# Patient Record
Sex: Female | Born: 1940 | Race: White | Hispanic: No | Smoking: Never smoker
Health system: Southern US, Community
[De-identification: ages and names within clinical notes are randomized; demographics above are authoritative.]

## PROBLEM LIST (undated history)

## (undated) DIAGNOSIS — H409 Unspecified glaucoma: Secondary | ICD-10-CM

## (undated) DIAGNOSIS — J189 Pneumonia, unspecified organism: Secondary | ICD-10-CM

## (undated) DIAGNOSIS — M1990 Unspecified osteoarthritis, unspecified site: Secondary | ICD-10-CM

## (undated) DIAGNOSIS — E7800 Pure hypercholesterolemia, unspecified: Secondary | ICD-10-CM

## (undated) DIAGNOSIS — I10 Essential (primary) hypertension: Secondary | ICD-10-CM

## (undated) DIAGNOSIS — E039 Hypothyroidism, unspecified: Secondary | ICD-10-CM

## (undated) DIAGNOSIS — E119 Type 2 diabetes mellitus without complications: Secondary | ICD-10-CM

## (undated) DIAGNOSIS — K219 Gastro-esophageal reflux disease without esophagitis: Secondary | ICD-10-CM

## (undated) HISTORY — PX: EYE SURGERY: SHX253

## (undated) HISTORY — DX: Hypothyroidism, unspecified: E03.9

## (undated) HISTORY — DX: Unspecified glaucoma: H40.9

## (undated) HISTORY — DX: Essential (primary) hypertension: I10

## (undated) HISTORY — DX: Type 2 diabetes mellitus without complications: E11.9

## (undated) HISTORY — DX: Pure hypercholesterolemia, unspecified: E78.00

## (undated) HISTORY — DX: Gastro-esophageal reflux disease without esophagitis: K21.9

---

## 2005-07-26 ENCOUNTER — Ambulatory Visit: Payer: Self-pay | Admitting: Internal Medicine

## 2005-09-27 ENCOUNTER — Ambulatory Visit: Payer: Self-pay | Admitting: Internal Medicine

## 2006-08-03 ENCOUNTER — Ambulatory Visit: Payer: Self-pay | Admitting: Internal Medicine

## 2006-10-03 ENCOUNTER — Ambulatory Visit: Payer: Self-pay | Admitting: Nephrology

## 2007-04-03 ENCOUNTER — Ambulatory Visit: Payer: Self-pay | Admitting: Nephrology

## 2007-09-06 ENCOUNTER — Ambulatory Visit: Payer: Self-pay | Admitting: Internal Medicine

## 2007-10-24 ENCOUNTER — Ambulatory Visit: Payer: Self-pay | Admitting: Nephrology

## 2007-11-09 ENCOUNTER — Ambulatory Visit: Payer: Self-pay | Admitting: Nephrology

## 2008-09-24 ENCOUNTER — Ambulatory Visit: Payer: Self-pay | Admitting: Internal Medicine

## 2010-03-16 ENCOUNTER — Ambulatory Visit: Payer: Self-pay | Admitting: Internal Medicine

## 2010-04-29 ENCOUNTER — Ambulatory Visit: Payer: Self-pay | Admitting: Gastroenterology

## 2011-02-03 ENCOUNTER — Ambulatory Visit: Payer: Self-pay | Admitting: Internal Medicine

## 2011-04-20 ENCOUNTER — Ambulatory Visit: Payer: Self-pay | Admitting: Internal Medicine

## 2011-04-20 LAB — HM MAMMOGRAPHY

## 2011-10-17 ENCOUNTER — Ambulatory Visit: Payer: Self-pay | Admitting: Internal Medicine

## 2011-12-19 ENCOUNTER — Ambulatory Visit: Payer: Self-pay | Admitting: General Practice

## 2011-12-19 LAB — MRSA PCR SCREENING

## 2011-12-19 LAB — URINALYSIS, COMPLETE
Bacteria: NONE SEEN
Bilirubin,UR: NEGATIVE
Blood: NEGATIVE
Ketone: NEGATIVE
Ph: 6 (ref 4.5–8.0)
Protein: NEGATIVE
Squamous Epithelial: 1

## 2011-12-19 LAB — CBC
HCT: 41.6 % (ref 35.0–47.0)
HGB: 14.2 g/dL (ref 12.0–16.0)
MCH: 30.7 pg (ref 26.0–34.0)
MCV: 90 fL (ref 80–100)
Platelet: 258 10*3/uL (ref 150–440)
RBC: 4.63 10*6/uL (ref 3.80–5.20)
RDW: 12 % (ref 11.5–14.5)
WBC: 6.3 10*3/uL (ref 3.6–11.0)

## 2011-12-19 LAB — BASIC METABOLIC PANEL
Anion Gap: 5 — ABNORMAL LOW (ref 7–16)
BUN: 25 mg/dL — ABNORMAL HIGH (ref 7–18)
Calcium, Total: 9.4 mg/dL (ref 8.5–10.1)
Creatinine: 1 mg/dL (ref 0.60–1.30)
EGFR (Non-African Amer.): 57 — ABNORMAL LOW
Glucose: 130 mg/dL — ABNORMAL HIGH (ref 65–99)
Osmolality: 289 (ref 275–301)

## 2011-12-19 LAB — PROTIME-INR
INR: 0.9
Prothrombin Time: 12.8 secs (ref 11.5–14.7)

## 2011-12-19 LAB — APTT: Activated PTT: 27.8 secs (ref 23.6–35.9)

## 2011-12-19 LAB — SEDIMENTATION RATE: Erythrocyte Sed Rate: 16 mm/hr (ref 0–30)

## 2011-12-20 LAB — URINE CULTURE

## 2011-12-28 HISTORY — PX: TOTAL HIP ARTHROPLASTY: SHX124

## 2012-01-02 ENCOUNTER — Inpatient Hospital Stay: Payer: Self-pay | Admitting: General Practice

## 2012-01-03 LAB — BASIC METABOLIC PANEL
Anion Gap: 6 — ABNORMAL LOW (ref 7–16)
Calcium, Total: 7.7 mg/dL — ABNORMAL LOW (ref 8.5–10.1)
Chloride: 101 mmol/L (ref 98–107)
Co2: 30 mmol/L (ref 21–32)
Creatinine: 1.08 mg/dL (ref 0.60–1.30)
EGFR (African American): >60
EGFR (Non-African Amer.): 52 — ABNORMAL LOW
Osmolality: 279 (ref 275–301)
Sodium: 137 mmol/L (ref 136–145)

## 2012-01-03 LAB — PLATELET COUNT: Platelet: 182 10*3/uL (ref 150–440)

## 2012-01-03 LAB — PATHOLOGY REPORT

## 2012-01-03 LAB — HEMOGLOBIN: HGB: 10.7 g/dL — ABNORMAL LOW (ref 12.0–16.0)

## 2012-01-04 LAB — BASIC METABOLIC PANEL
Anion Gap: 5 — ABNORMAL LOW (ref 7–16)
BUN: 16 mg/dL (ref 7–18)
Calcium, Total: 8.1 mg/dL — ABNORMAL LOW (ref 8.5–10.1)
Chloride: 100 mmol/L (ref 98–107)
EGFR (African American): 56 — ABNORMAL LOW
EGFR (Non-African Amer.): 49 — ABNORMAL LOW
Glucose: 118 mg/dL — ABNORMAL HIGH (ref 65–99)
Osmolality: 267 (ref 275–301)
Potassium: 3.6 mmol/L (ref 3.5–5.1)
Sodium: 132 mmol/L — ABNORMAL LOW (ref 136–145)

## 2012-01-04 LAB — PLATELET COUNT: Platelet: 164 10*3/uL (ref 150–440)

## 2012-04-25 ENCOUNTER — Ambulatory Visit: Payer: Self-pay | Admitting: Internal Medicine

## 2012-06-14 ENCOUNTER — Other Ambulatory Visit: Payer: Self-pay | Admitting: *Deleted

## 2012-06-14 NOTE — Telephone Encounter (Signed)
Will send to pharmacy, called pt to see what dosage of Pravastatin she uses since it was not included on fax sent by [facility]. Left message for pt to callback office with dosage of Pravastatin

## 2012-06-14 NOTE — Telephone Encounter (Signed)
Ok to fill #[id number] with no refills.

## 2012-06-14 NOTE — Telephone Encounter (Signed)
R'cd fax from [facility] for 90 day supply refill of Pravastatin-pt has appointment on 08/10/2012 with you-please advise on refill.

## 2012-06-15 MED ORDER — PRAVASTATIN SODIUM 40 MG PO TABS: 40.0000 mg | ORAL_TABLET | Freq: Every day | ORAL | Status: DC

## 2012-06-15 NOTE — Telephone Encounter (Signed)
Pt is currently taking Pravastatin 40mg  qd, rx sent to [facility], pt informed.

## 2012-08-10 ENCOUNTER — Encounter: Payer: Self-pay | Admitting: Internal Medicine

## 2012-08-10 ENCOUNTER — Ambulatory Visit (INDEPENDENT_AMBULATORY_CARE_PROVIDER_SITE_OTHER): Payer: 59 | Admitting: Internal Medicine

## 2012-08-10 VITALS — BP 140/72 | HR 52 | Temp 97.4°F | Ht 60.0 in | Wt 229.5 lb

## 2012-08-10 DIAGNOSIS — H409 Unspecified glaucoma: Secondary | ICD-10-CM

## 2012-08-10 DIAGNOSIS — E039 Hypothyroidism, unspecified: Secondary | ICD-10-CM

## 2012-08-10 DIAGNOSIS — E1165 Type 2 diabetes mellitus with hyperglycemia: Secondary | ICD-10-CM | POA: Insufficient documentation

## 2012-08-10 DIAGNOSIS — I10 Essential (primary) hypertension: Secondary | ICD-10-CM

## 2012-08-10 DIAGNOSIS — E7800 Pure hypercholesterolemia, unspecified: Secondary | ICD-10-CM

## 2012-08-10 DIAGNOSIS — E119 Type 2 diabetes mellitus without complications: Secondary | ICD-10-CM

## 2012-08-10 DIAGNOSIS — K219 Gastro-esophageal reflux disease without esophagitis: Secondary | ICD-10-CM

## 2012-08-10 DIAGNOSIS — E1169 Type 2 diabetes mellitus with other specified complication: Secondary | ICD-10-CM | POA: Insufficient documentation

## 2012-08-10 DIAGNOSIS — E785 Hyperlipidemia, unspecified: Secondary | ICD-10-CM | POA: Insufficient documentation

## 2012-08-10 NOTE — Patient Instructions (Addendum)
It was nice seeing you today.  I am glad you have been doing well.  Let me know if you need anything. 

## 2012-08-12 ENCOUNTER — Encounter: Payer: Self-pay | Admitting: Internal Medicine

## 2012-08-12 NOTE — Assessment & Plan Note (Signed)
On Januvia.  Continue diet and exercise.  Keep up to date with eye checks. Check met b and a1c.

## 2012-08-12 NOTE — Assessment & Plan Note (Signed)
Upper symptoms controlled on Protonix.  Follow.

## 2012-08-12 NOTE — Assessment & Plan Note (Signed)
Followed by opthalmology.       

## 2012-08-12 NOTE — Progress Notes (Signed)
Subjective:    Patient ID: Lorraine Foley, female    DOB: 1941-03-10, 71 y.o.   MRN: [id number]  HPI 71 year old female with past history of hypertension, hypercholesterolemia and diabetes who comes in today for a scheduled follow up.  She states she has been doing relatively well.  Is having some left knee discomfort.  She thinks this flared from trying to compensate for the right hip problems she had been having.  She is doing well from her right hip surgery.  Has follow up with Dr Ernest Pine in 5/14.  She states she has not been watching her diet as well lately, but plans to get more serious.  Blood pressure checks - 130s/70s.  She has not been checking her sugars.  She did get her flu shot.  Feels her breathing is stable.    Past Medical History  Diagnosis Date  . Hypertension   . Diabetes mellitus   . Hypercholesterolemia   . Glaucoma   . GERD (gastroesophageal reflux disease)   . Hypothyroidism     Current Outpatient Prescriptions on File Prior to Visit  Medication Sig Dispense Refill  . amLODipine (NORVASC) 10 MG tablet Take 10 mg by mouth daily.      . calcium-vitamin D (CALCIUM 500+D) 500-200 MG-UNIT per tablet Take 1 tablet by mouth daily.      . cloNIDine (CATAPRES) 0.1 MG tablet Take 0.1 mg by mouth 3 (three) times daily.      Marland Kitchen levothyroxine (SYNTHROID) 112 MCG tablet Take 112 mcg by mouth daily.      Marland Kitchen omega-3 acid ethyl esters (LOVAZA) 1 G capsule Take 2 g by mouth daily.      . pantoprazole (PROTONIX) 40 MG tablet Take 40 mg by mouth daily.      . pravastatin (PRAVACHOL) 40 MG tablet Take 1 tablet (40 mg total) by mouth daily.  90 tablet  0  . quinapril (ACCUPRIL) 40 MG tablet Take 40 mg by mouth 2 (two) times daily.      . sitaGLIPtin (JANUVIA) 100 MG tablet Take 100 mg by mouth daily.      Marland Kitchen spironolactone-hydrochlorothiazide (ALDACTAZIDE) 25-25 MG per tablet Take 1 tablet by mouth daily.        Review of Systems Patient denies any headache, lightheadedness or dizziness.   No sinus or allergy symptoms.  No chest pain, tightness or palpitations.  No increased shortness of breath, cough or congestion.  Breathing stable.  No nausea or vomiting.  No abdominal pain or cramping.  No bowel change, such as diarrhea, constipation, BRBPR or melana.  No urine change.        Objective:   Physical Exam Filed Vitals:   08/10/12 0941  BP: 140/72  Pulse: 52  Temp: 97.4 F (60.59 C)   71 year old female in no acute distress.   HEENT:  Nares - clear.  OP- without lesions or erythema.  NECK:  Supple, nontender.  Left carotid bruit.    HEART:  Appears to be regular.  I/VI systolic murmur.  LUNGS:  Without crackles or wheezing audible.  Respirations even and unlabored.   RADIAL PULSE:  Equal bilaterally.  ABDOMEN:  Soft, nontender.  No audible abdominal bruit.   EXTREMITIES:  No increased edema to be present.                  Assessment & Plan:  MSK. Doing well s/p right hip surgery.  Seeing Dr Ernest Pine.  Is having some left knee  pain.  Seeing chiropractor.  Desires no further intervention at this time.    CARDIOVASCULAR.  Stable.  Asymptomatic.  Continue risk factor modification.    PULMONARY.  Breathing stable.  Follow.   GI.  Colonoscopy 9/11 with diverticulosis.  Upper symptoms controlled on Protonix.    LEFT CAROTID BRUIT.  Had carotid ultrasound 6/12 that revealed no hemodynamically significant stenosis.  Continue risk factor modification and daily aspirin.    HEALTH MAINTENANCE.  Physical 03/21/12.  Colonoscopy as outlined. Obtain results of mammogram for 2013.  Bone density 05/07/08 normal.

## 2012-08-12 NOTE — Assessment & Plan Note (Signed)
On Synthroid.  Follow TSH.   

## 2012-08-12 NOTE — Assessment & Plan Note (Signed)
Low cholesterol diet and exercise.  Taking zocor.  Check lipid panel and liver function.

## 2012-08-12 NOTE — Assessment & Plan Note (Signed)
Blood pressure has been doing better. Same meds.  Check metabolic panel.

## 2012-08-17 ENCOUNTER — Encounter: Payer: Self-pay | Admitting: Internal Medicine

## 2012-08-19 ENCOUNTER — Telehealth: Payer: Self-pay | Admitting: Internal Medicine

## 2012-08-19 NOTE — Telephone Encounter (Signed)
I need her lab results from [facility].  See message.  Had drawn last week.

## 2012-08-20 NOTE — Telephone Encounter (Signed)
Labs arrived and are in the received folder on counter

## 2012-08-20 NOTE — Telephone Encounter (Signed)
[facility], they are going to fax over patient's labs.

## 2012-08-21 NOTE — Telephone Encounter (Signed)
Called patient to let her know. Left message for patient to return call. Will call back again later.

## 2012-08-21 NOTE — Telephone Encounter (Signed)
Notify pt I reviewed her labs.  The overall sugar control (a1c = 6.5) - is good.  Continue diabetic diet and same meds.  Renal function is stable.  Liver panel wnl.  Other labs ok except total and LDL (bad) cholesterol - elevated.  Low cholesterol diet.  Is she taking her pravastatin regularly and not missing doses.  If not, then needs to take daily.  If taking daily and not missing doses then I would like to increase her pravastatin to 80mg  q day.  Will need new rx and will need to change on her med list.  If any problems, let me know.

## 2012-08-24 ENCOUNTER — Other Ambulatory Visit: Payer: Self-pay | Admitting: General Practice

## 2012-08-24 MED ORDER — PRAVASTATIN SODIUM 40 MG PO TABS: 80.0000 mg | ORAL_TABLET | Freq: Every day | ORAL | Status: DC

## 2012-08-24 NOTE — Progress Notes (Signed)
Per the note on her most recent labs (phone message) - her pravastatin was increased to 80mg  q day.  Pt had wanted to use her 40mg  tablets before getting a new rx.  If she is needing a new prescription then can now call in 80mg  q day.  Per the rx that was sent in 90 tablets of the 40mg  sent in.  (if she is taking 2/day- then this will only last her 1 1/2 month).  I would recommend sending in rx for the 80mg  and have her take one per day.  I think she uses mail order, but can clarify with the patient.  Let me know if any problems.

## 2012-08-24 NOTE — Progress Notes (Signed)
Patient has new prescription of 40 mg pravastatin. She wants to finish this prescription before she starts 80 mg.

## 2012-08-24 NOTE — Telephone Encounter (Signed)
Pt notified of her recent lab results. Pt asked if she could just double up her 40mg  Pravastatin instead of calling a new Rx. Informed pt that should be ok.

## 2012-09-08 ENCOUNTER — Encounter: Payer: Self-pay | Admitting: Internal Medicine

## 2012-09-10 ENCOUNTER — Telehealth: Payer: Self-pay | Admitting: Internal Medicine

## 2012-09-10 NOTE — Telephone Encounter (Signed)
She had labs at [facility].  They were in your "to do" folder.

## 2012-09-14 ENCOUNTER — Encounter: Payer: Self-pay | Admitting: Internal Medicine

## 2012-11-08 ENCOUNTER — Other Ambulatory Visit: Payer: Self-pay | Admitting: Internal Medicine

## 2012-11-08 NOTE — Telephone Encounter (Signed)
Patient left a message on voicemail stating she needs a refill on her Pravastatin

## 2012-11-09 ENCOUNTER — Telehealth: Payer: Self-pay | Admitting: *Deleted

## 2012-11-09 MED ORDER — PRAVASTATIN SODIUM 40 MG PO TABS: 80.0000 mg | ORAL_TABLET | Freq: Every day | ORAL | Status: DC

## 2012-11-09 NOTE — Telephone Encounter (Signed)
Patient called back and said that she wants to mail her rx to her home address.

## 2012-11-09 NOTE — Telephone Encounter (Signed)
See other message, script is waiting to be signed.

## 2012-11-09 NOTE — Telephone Encounter (Signed)
Lorraine Foley called the office requesting a refill on her pravastatin 40 mg. She would like to pick up a paper script this afternoon

## 2012-11-12 ENCOUNTER — Telehealth: Payer: Self-pay | Admitting: *Deleted

## 2012-11-12 NOTE — Telephone Encounter (Signed)
Called patient to let her know that her prescription is ready for pick-up. Left message on voice mail.

## 2012-11-21 ENCOUNTER — Other Ambulatory Visit: Payer: Self-pay | Admitting: *Deleted

## 2012-11-23 MED ORDER — PRAVASTATIN SODIUM 40 MG PO TABS: 80.0000 mg | ORAL_TABLET | Freq: Every day | ORAL | Status: DC

## 2012-11-23 MED ORDER — LEVOTHYROXINE SODIUM 112 MCG PO TABS: 112.0000 ug | ORAL_TABLET | Freq: Every day | ORAL | Status: DC

## 2012-11-23 MED ORDER — SPIRONOLACTONE-HCTZ 25-25 MG PO TABS: 1.0000 | ORAL_TABLET | Freq: Every day | ORAL | Status: DC

## 2012-11-23 MED ORDER — PANTOPRAZOLE SODIUM 40 MG PO TBEC: 40.0000 mg | DELAYED_RELEASE_TABLET | Freq: Every day | ORAL | Status: DC

## 2012-11-23 MED ORDER — AMLODIPINE BESYLATE 10 MG PO TABS: 10.0000 mg | ORAL_TABLET | Freq: Every day | ORAL | Status: DC

## 2012-11-23 MED ORDER — CLONIDINE HCL 0.1 MG PO TABS: 0.1000 mg | ORAL_TABLET | Freq: Three times a day (TID) | ORAL | Status: DC

## 2012-11-23 MED ORDER — QUINAPRIL HCL 40 MG PO TABS: 40.0000 mg | ORAL_TABLET | Freq: Two times a day (BID) | ORAL | Status: DC

## 2012-11-23 NOTE — Telephone Encounter (Signed)
meds filled

## 2012-11-24 ENCOUNTER — Telehealth: Payer: Self-pay | Admitting: Internal Medicine

## 2012-11-24 NOTE — Telephone Encounter (Signed)
Opened in error

## 2012-11-28 ENCOUNTER — Encounter: Payer: Self-pay | Admitting: *Deleted

## 2012-12-11 ENCOUNTER — Other Ambulatory Visit: Payer: Self-pay | Admitting: Internal Medicine

## 2012-12-11 ENCOUNTER — Ambulatory Visit (INDEPENDENT_AMBULATORY_CARE_PROVIDER_SITE_OTHER): Payer: 59 | Admitting: Internal Medicine

## 2012-12-11 ENCOUNTER — Encounter: Payer: Self-pay | Admitting: Internal Medicine

## 2012-12-11 VITALS — BP 130/70 | HR 64 | Temp 98.5°F | Wt 224.5 lb

## 2012-12-11 DIAGNOSIS — I10 Essential (primary) hypertension: Secondary | ICD-10-CM

## 2012-12-11 DIAGNOSIS — K219 Gastro-esophageal reflux disease without esophagitis: Secondary | ICD-10-CM

## 2012-12-11 DIAGNOSIS — E7800 Pure hypercholesterolemia, unspecified: Secondary | ICD-10-CM

## 2012-12-11 DIAGNOSIS — E119 Type 2 diabetes mellitus without complications: Secondary | ICD-10-CM

## 2012-12-11 DIAGNOSIS — E039 Hypothyroidism, unspecified: Secondary | ICD-10-CM

## 2012-12-11 DIAGNOSIS — H409 Unspecified glaucoma: Secondary | ICD-10-CM

## 2012-12-12 ENCOUNTER — Other Ambulatory Visit: Payer: Self-pay | Admitting: Internal Medicine

## 2012-12-12 LAB — LIPID PANEL W/O CHOL/HDL RATIO
LDL Calculated: 106 mg/dL — ABNORMAL HIGH (ref 0–99)
Triglycerides: 162 mg/dL — ABNORMAL HIGH (ref 0–149)

## 2012-12-12 LAB — HEPATIC FUNCTION PANEL
Albumin: 4.5 g/dL (ref 3.5–4.8)
Bilirubin, Direct: 0.13 mg/dL (ref 0.00–0.40)
Total Protein: 6.7 g/dL (ref 6.0–8.5)

## 2012-12-12 LAB — BASIC METABOLIC PANEL
BUN/Creatinine Ratio: 20 (ref 11–26)
GFR calc Af Amer: 52 mL/min/{1.73_m2} — ABNORMAL LOW (ref >59)
GFR calc non Af Amer: 45 mL/min/{1.73_m2} — ABNORMAL LOW (ref >59)
Glucose: 150 mg/dL — ABNORMAL HIGH (ref 65–99)
Potassium: 4.8 mmol/L (ref 3.5–5.2)

## 2012-12-12 LAB — HGB A1C W/O EAG: Hgb A1c MFr Bld: 7 % — ABNORMAL HIGH (ref 4.8–5.6)

## 2012-12-12 MED ORDER — PRAVASTATIN SODIUM 40 MG PO TABS: 80.0000 mg | ORAL_TABLET | Freq: Every day | ORAL | Status: DC

## 2012-12-12 NOTE — Telephone Encounter (Signed)
Received mail from pt requesting refill on her Pravastatin though [facility]  Order has been sent in to mail order pharmacy

## 2012-12-16 ENCOUNTER — Encounter: Payer: Self-pay | Admitting: Internal Medicine

## 2012-12-16 NOTE — Assessment & Plan Note (Signed)
Blood pressure has been doing better. Same meds.  Check metabolic panel.

## 2012-12-16 NOTE — Assessment & Plan Note (Signed)
Followed by opthalmology.       

## 2012-12-16 NOTE — Assessment & Plan Note (Signed)
On Synthroid.  Follow TSH.   

## 2012-12-16 NOTE — Progress Notes (Signed)
Subjective:    Patient ID: Francetta Ilg, female    DOB: [birth year], 72 y.o.   MRN: [id number]  HPI 72 year old female with past history of hypertension, hypercholesterolemia and diabetes who comes in today for a scheduled follow up.  She states she has been doing relatively well.  She is doing well from her right hip surgery.  Has follow up with Dr Ernest Pine in 5/14.   Trying to watch her diet.  Has been concentrating more on her cholesterol.   Has a Control and instrumentation engineer.   Blood pressure checks have been doing well.  Feels her breathing is stable.  Overall she feels good.    Past Medical History  Diagnosis Date  . Hypertension   . Diabetes mellitus   . Hypercholesterolemia   . Glaucoma   . GERD (gastroesophageal reflux disease)   . Hypothyroidism     Current Outpatient Prescriptions on File Prior to Visit  Medication Sig Dispense Refill  . amLODipine (NORVASC) 10 MG tablet Take 1 tablet (10 mg total) by mouth daily.  90 tablet  1  . aspirin 81 MG tablet Take 81 mg by mouth daily.      . calcium-vitamin D (CALCIUM 500+D) 500-200 MG-UNIT per tablet Take 1 tablet by mouth daily.      . Cholecalciferol (VITAMIN D-3) 1000 UNITS CAPS Take 1 capsule by mouth daily.      . cloNIDine (CATAPRES) 0.1 MG tablet Take 1 tablet (0.1 mg total) by mouth 3 (three) times daily.  90 tablet  1  . glucosamine-chondroitin 500-400 MG tablet Take 1 tablet by mouth daily.      Marland Kitchen glucose blood test strip accuchek compact test strips Check blood sugar tid      . latanoprost (XALATAN) 0.005 % ophthalmic solution As directed      . LevOCARNitine (L-CARNITINE) 250 MG CAPS Take 1 capsule by mouth daily.      Marland Kitchen levothyroxine (SYNTHROID) 112 MCG tablet Take 1 tablet (112 mcg total) by mouth daily.  90 tablet  1  . Multiple Vitamin (MULTIVITAMIN) tablet Take 1 tablet by mouth daily.      . Multiple Vitamins-Minerals (MULTIVITAMIN & MINERAL PO) Take by mouth daily.      Marland Kitchen omega-3 acid ethyl esters (LOVAZA) 1 G capsule Take 2 g by  mouth daily.      . pantoprazole (PROTONIX) 40 MG tablet Take 1 tablet (40 mg total) by mouth daily.  90 tablet  1  . quinapril (ACCUPRIL) 40 MG tablet Take 1 tablet (40 mg total) by mouth 2 (two) times daily.  60 tablet  3  . sitaGLIPtin (JANUVIA) 100 MG tablet Take 100 mg by mouth daily.      Marland Kitchen spironolactone-hydrochlorothiazide (ALDACTAZIDE) 25-25 MG per tablet Take 1 tablet by mouth daily.  90 tablet  1   No current facility-administered medications on file prior to visit.    Review of Systems Patient denies any headache, lightheadedness or dizziness.  No sinus or allergy symptoms.  No chest pain, tightness or palpitations.  No increased shortness of breath, cough or congestion.  Breathing stable.  No nausea or vomiting.  No abdominal pain or cramping.  No bowel change, such as diarrhea, constipation, BRBPR or melana.  No urine change.        Objective:   Physical Exam  Filed Vitals:   12/11/12 0759  BP: 130/70  Pulse: 64  Temp: 98.5 F (42.52 C)   72 year old female in no acute distress.  HEENT:  Nares - clear.  OP- without lesions or erythema.  NECK:  Supple, nontender.  Left carotid bruit.    HEART:  Appears to be regular.  I/VI systolic murmur.  LUNGS:  Without crackles or wheezing audible.  Respirations even and unlabored.   RADIAL PULSE:  Equal bilaterally.  ABDOMEN:  Soft, nontender.  No audible abdominal bruit.   EXTREMITIES:  No increased edema to be present.                  Assessment & Plan:  MSK. Doing well s/p right hip surgery.  Seeing Dr Ernest Pine.   CARDIOVASCULAR.  Stable.  Asymptomatic.  Continue risk factor modification.    PULMONARY.  Breathing stable.  Follow.   GI.  Colonoscopy 9/11 with diverticulosis.  Upper symptoms controlled on Protonix.    LEFT CAROTID BRUIT.  Had carotid ultrasound 6/12 that revealed no hemodynamically significant stenosis.  Continue risk factor modification and daily aspirin.    HEALTH MAINTENANCE.  Physical 03/21/12.   Colonoscopy as outlined.   Mammogram 04/25/12 - Birads II.  Bone density 05/07/08 normal.

## 2012-12-16 NOTE — Assessment & Plan Note (Signed)
Low cholesterol diet and exercise.  Continue current cholesterol medication.  Check lipid panel and liver function.

## 2012-12-16 NOTE — Assessment & Plan Note (Signed)
Upper symptoms controlled on Protonix.  Follow.

## 2012-12-16 NOTE — Assessment & Plan Note (Signed)
On Januvia.  Continue diet and exercise.  Keep up to date with eye checks. Check met b and a1c.

## 2013-01-01 ENCOUNTER — Other Ambulatory Visit: Payer: Self-pay | Admitting: Internal Medicine

## 2013-01-02 LAB — BASIC METABOLIC PANEL
Calcium: 10 mg/dL (ref 8.6–10.2)
Creatinine, Ser: 1.16 mg/dL — ABNORMAL HIGH (ref 0.57–1.00)
GFR calc Af Amer: 55 mL/min/{1.73_m2} — ABNORMAL LOW (ref >59)
GFR calc non Af Amer: 47 mL/min/{1.73_m2} — ABNORMAL LOW (ref >59)
Sodium: 142 mmol/L (ref 134–144)

## 2013-01-03 ENCOUNTER — Encounter: Payer: Self-pay | Admitting: Internal Medicine

## 2013-01-04 ENCOUNTER — Encounter: Payer: Self-pay | Admitting: Internal Medicine

## 2013-01-07 ENCOUNTER — Telehealth: Payer: Self-pay

## 2013-01-07 NOTE — Telephone Encounter (Signed)
MyChart Message: I reviewed your recent lab results. Your kidney function is stable. Your sodium and potassium are within normal limits. Stay hydrated. Let me know if you need anything.   Left message for patient to either call office back or to check her mychart message for her lab results.

## 2013-04-03 ENCOUNTER — Encounter: Payer: Self-pay | Admitting: *Deleted

## 2013-04-11 ENCOUNTER — Other Ambulatory Visit: Payer: Self-pay | Admitting: Internal Medicine

## 2013-04-11 ENCOUNTER — Encounter: Payer: Self-pay | Admitting: Internal Medicine

## 2013-04-11 ENCOUNTER — Ambulatory Visit (INDEPENDENT_AMBULATORY_CARE_PROVIDER_SITE_OTHER): Payer: 59 | Admitting: Internal Medicine

## 2013-04-11 VITALS — BP 120/60 | HR 42 | Temp 97.7°F | Ht 60.0 in | Wt 221.0 lb

## 2013-04-11 DIAGNOSIS — E7800 Pure hypercholesterolemia, unspecified: Secondary | ICD-10-CM

## 2013-04-11 DIAGNOSIS — I498 Other specified cardiac arrhythmias: Secondary | ICD-10-CM

## 2013-04-11 DIAGNOSIS — K219 Gastro-esophageal reflux disease without esophagitis: Secondary | ICD-10-CM

## 2013-04-11 DIAGNOSIS — H409 Unspecified glaucoma: Secondary | ICD-10-CM

## 2013-04-11 DIAGNOSIS — I10 Essential (primary) hypertension: Secondary | ICD-10-CM

## 2013-04-11 DIAGNOSIS — R001 Bradycardia, unspecified: Secondary | ICD-10-CM

## 2013-04-11 DIAGNOSIS — E039 Hypothyroidism, unspecified: Secondary | ICD-10-CM

## 2013-04-11 DIAGNOSIS — E119 Type 2 diabetes mellitus without complications: Secondary | ICD-10-CM

## 2013-04-11 DIAGNOSIS — Z1211 Encounter for screening for malignant neoplasm of colon: Secondary | ICD-10-CM

## 2013-04-11 LAB — HM DIABETES FOOT EXAM

## 2013-04-11 NOTE — Progress Notes (Signed)
Subjective:    Patient ID: Lorraine Foley, female    DOB: 03-26-1941, 72 y.o.   MRN: [id number]  HPI 72 year old female with past history of hypertension, hypercholesterolemia and diabetes who comes in today to follow up on these issues as well as for a complete physical exam.  She states she has been doing relatively well.  She is doing well from her right hip surgery.  She has noticed some discomfort in her left knee.  Desires no further intervention at this time.  Trying to watch her diet.  Has lost some weight.   Blood pressure checks have been doing well.   States blood pressures have been averaging 130s systolic readings.  Feels her breathing is stable.  Overall she feels good.     Past Medical History  Diagnosis Date  . Hypertension   . Diabetes mellitus   . Hypercholesterolemia   . Glaucoma   . GERD (gastroesophageal reflux disease)   . Hypothyroidism     Current Outpatient Prescriptions on File Prior to Visit  Medication Sig Dispense Refill  . amLODipine (NORVASC) 10 MG tablet Take 1 tablet (10 mg total) by mouth daily.  90 tablet  1  . aspirin 81 MG tablet Take 81 mg by mouth daily.      . brimonidine (ALPHAGAN) 0.2 % ophthalmic solution Place 1 drop into both eyes 2 (two) times daily.      . calcium-vitamin D (CALCIUM 500+D) 500-200 MG-UNIT per tablet Take 1 tablet by mouth daily.      . Cholecalciferol (VITAMIN D-3) 1000 UNITS CAPS Take 1 capsule by mouth daily.      . cloNIDine (CATAPRES) 0.1 MG tablet Take 1 tablet (0.1 mg total) by mouth 3 (three) times daily.  90 tablet  1  . glucosamine-chondroitin 500-400 MG tablet Take 1 tablet by mouth daily.      Marland Kitchen glucose blood test strip accuchek compact test strips Check blood sugar tid      . latanoprost (XALATAN) 0.005 % ophthalmic solution As directed      . LevOCARNitine (L-CARNITINE) 250 MG CAPS Take 1 capsule by mouth daily.      Marland Kitchen levothyroxine (SYNTHROID) 112 MCG tablet Take 1 tablet (112 mcg total) by mouth daily.  90  tablet  1  . Multiple Vitamin (MULTIVITAMIN) tablet Take 1 tablet by mouth daily.      Marland Kitchen omega-3 acid ethyl esters (LOVAZA) 1 G capsule Take 2 g by mouth daily.      . pantoprazole (PROTONIX) 40 MG tablet Take 1 tablet (40 mg total) by mouth daily.  90 tablet  1  . pravastatin (PRAVACHOL) 40 MG tablet Take 2 tablets (80 mg total) by mouth daily.  90 tablet  1  . quinapril (ACCUPRIL) 40 MG tablet Take 1 tablet (40 mg total) by mouth 2 (two) times daily.  60 tablet  3  . sitaGLIPtin (JANUVIA) 100 MG tablet Take 100 mg by mouth daily.      Marland Kitchen spironolactone-hydrochlorothiazide (ALDACTAZIDE) 25-25 MG per tablet Take 1 tablet by mouth daily.  90 tablet  1   No current facility-administered medications on file prior to visit.    Review of Systems Patient denies any headache, lightheadedness or dizziness.  No sinus or allergy symptoms.  No chest pain, tightness or palpitations.  No increased shortness of breath, cough or congestion.  Breathing stable.  No nausea or vomiting.  No abdominal pain or cramping.  No bowel change, such as diarrhea, constipation, BRBPR  or melana.  No urine change.  Has lost some weight.  Overall she feels she is doing well.         Objective:   Physical Exam  Filed Vitals:   04/11/13 0825  BP: 120/60  Pulse: 42  Temp: 97.7 F (36.5 C)   Blood pressure recheck:  132/64, pulse 61  72 year old female in no acute distress.   HEENT:  Nares- clear.  Oropharynx - without lesions. NECK:  Supple.  Nontender.  No audible bruit.  HEART:  Appears to be regular. LUNGS:  No crackles or wheezing audible.  Respirations even and unlabored.  RADIAL PULSE:  Equal bilaterally.    BREASTS:  No nipple discharge or nipple retraction present.  Could not appreciate any distinct nodules or axillary adenopathy.  ABDOMEN:  Soft, nontender.  Bowel sounds present and normal.  No audible abdominal bruit.  GU:  Not performed.     EXTREMITIES:  No increased edema present.  DP pulses palpable  and equal bilaterally.  FEET:  Without lesions.           Assessment & Plan:  MSK. Doing well s/p right hip surgery.  Seeing Dr Ernest Pine.  Left knee pain.  Desires no further intervention at this time.  Follow.   CARDIOVASCULAR.  Stable.  Asymptomatic.  Continue risk factor modification.    PULMONARY.  Breathing stable.  Follow.   GI.  Colonoscopy 9/11 with diverticulosis.  Upper symptoms controlled on Protonix.    LEFT CAROTID BRUIT.  Had carotid ultrasound 6/12 that revealed no hemodynamically significant stenosis.  Continue risk factor modification and daily aspirin.    HEALTH MAINTENANCE.  Physical today.  Colonoscopy as outlined.   IFOB.  Mammogram 04/25/12 - Birads II.  Information given for her to schedule her mammogram.  Bone density 05/07/08 normal.

## 2013-04-12 LAB — BASIC METABOLIC PANEL
BUN/Creatinine Ratio: 23 (ref 11–26)
CO2: 31 mmol/L — ABNORMAL HIGH (ref 18–29)
Chloride: 100 mmol/L (ref 97–108)
GFR calc Af Amer: 48 mL/min/{1.73_m2} — ABNORMAL LOW (ref >59)
Potassium: 5.1 mmol/L (ref 3.5–5.2)
Sodium: 143 mmol/L (ref 134–144)

## 2013-04-12 LAB — MICROALBUMIN / CREATININE URINE RATIO: MICROALB/CREAT RATIO: 4.2 mg/g creat (ref 0.0–30.0)

## 2013-04-12 LAB — HEPATIC FUNCTION PANEL
ALT: 14 IU/L (ref 0–32)
Albumin: 4.9 g/dL — ABNORMAL HIGH (ref 3.5–4.8)
Total Bilirubin: 0.8 mg/dL (ref 0.0–1.2)
Total Protein: 7.6 g/dL (ref 6.0–8.5)

## 2013-04-12 LAB — LIPID PANEL WITH LDL/HDL RATIO
LDL Calculated: 120 mg/dL — ABNORMAL HIGH (ref 0–99)
LDl/HDL Ratio: 2.7 ratio units (ref 0.0–3.2)
Triglycerides: 186 mg/dL — ABNORMAL HIGH (ref 0–149)

## 2013-04-12 LAB — CBC WITH DIFFERENTIAL
Basophils Absolute: 0 10*3/uL (ref 0.0–0.2)
Eosinophils Absolute: 0.3 10*3/uL (ref 0.0–0.4)
HCT: 42.7 % (ref 34.0–46.6)
Immature Granulocytes: 0 % (ref 0–2)
Lymphocytes Absolute: 1.3 10*3/uL (ref 0.7–3.1)
MCHC: 34.4 g/dL (ref 31.5–35.7)
MCV: 88 fL (ref 79–97)
Neutrophils Absolute: 3.8 10*3/uL (ref 1.4–7.0)
Platelets: 291 10*3/uL (ref 150–379)
RDW: 12.8 % (ref 12.3–15.4)

## 2013-04-13 ENCOUNTER — Encounter: Payer: Self-pay | Admitting: Internal Medicine

## 2013-04-13 DIAGNOSIS — K219 Gastro-esophageal reflux disease without esophagitis: Secondary | ICD-10-CM

## 2013-04-13 DIAGNOSIS — R001 Bradycardia, unspecified: Secondary | ICD-10-CM | POA: Insufficient documentation

## 2013-04-13 NOTE — Assessment & Plan Note (Signed)
Upper symptoms controlled on Protonix.  Follow.

## 2013-04-13 NOTE — Assessment & Plan Note (Signed)
Pulse rate as outlined.  Not on beta blockers.  Will decrease amlodipine to 5mg  q day.  Follow blood pressure and pulse.  Get her back in soon to reassess.

## 2013-04-13 NOTE — Assessment & Plan Note (Signed)
On Januvia.  Continue diet and exercise.  Keep up to date with eye checks. Check met b and a1c.

## 2013-04-13 NOTE — Assessment & Plan Note (Signed)
On Synthroid.  Follow TSH.   

## 2013-04-13 NOTE — Assessment & Plan Note (Signed)
Followed by opthalmology.       

## 2013-04-13 NOTE — Assessment & Plan Note (Signed)
Blood pressure has been doing better. Same meds.  Check metabolic panel.

## 2013-04-13 NOTE — Assessment & Plan Note (Signed)
Low cholesterol diet and exercise.  Continue current cholesterol medication.  Check lipid panel and liver function.

## 2013-04-15 NOTE — Telephone Encounter (Signed)
Mailed unread message to pt  

## 2013-04-17 ENCOUNTER — Encounter: Payer: Self-pay | Admitting: *Deleted

## 2013-04-17 ENCOUNTER — Telehealth: Payer: Self-pay | Admitting: *Deleted

## 2013-04-17 NOTE — Telephone Encounter (Signed)
Pt called to update immunization record and most recent eye exam. Chart updated.

## 2013-04-21 ENCOUNTER — Encounter: Payer: Self-pay | Admitting: Internal Medicine

## 2013-04-30 ENCOUNTER — Encounter: Payer: Self-pay | Admitting: Internal Medicine

## 2013-05-06 ENCOUNTER — Ambulatory Visit: Payer: Self-pay | Admitting: Internal Medicine

## 2013-05-06 LAB — HM MAMMOGRAPHY

## 2013-05-16 ENCOUNTER — Other Ambulatory Visit: Payer: Self-pay | Admitting: *Deleted

## 2013-05-16 MED ORDER — LEVOTHYROXINE SODIUM 112 MCG PO TABS: 112.0000 ug | ORAL_TABLET | Freq: Every day | ORAL | Status: DC

## 2013-05-17 ENCOUNTER — Other Ambulatory Visit: Payer: Self-pay | Admitting: Internal Medicine

## 2013-05-17 ENCOUNTER — Other Ambulatory Visit (INDEPENDENT_AMBULATORY_CARE_PROVIDER_SITE_OTHER): Payer: 59

## 2013-05-17 DIAGNOSIS — Z1211 Encounter for screening for malignant neoplasm of colon: Secondary | ICD-10-CM

## 2013-05-17 LAB — FECAL OCCULT BLOOD, IMMUNOCHEMICAL: Fecal Occult Bld: POSITIVE

## 2013-05-18 LAB — BASIC METABOLIC PANEL
Calcium: 10 mg/dL (ref 8.6–10.2)
Creatinine, Ser: 1.18 mg/dL — ABNORMAL HIGH (ref 0.57–1.00)
GFR calc Af Amer: 53 mL/min/{1.73_m2} — ABNORMAL LOW (ref >59)
GFR calc non Af Amer: 46 mL/min/{1.73_m2} — ABNORMAL LOW (ref >59)
Glucose: 137 mg/dL — ABNORMAL HIGH (ref 65–99)

## 2013-05-20 ENCOUNTER — Other Ambulatory Visit: Payer: Self-pay | Admitting: Internal Medicine

## 2013-05-20 DIAGNOSIS — R195 Other fecal abnormalities: Secondary | ICD-10-CM

## 2013-05-20 NOTE — Progress Notes (Signed)
Order placed for referral to gastro

## 2013-05-23 ENCOUNTER — Other Ambulatory Visit: Payer: Self-pay | Admitting: Internal Medicine

## 2013-05-23 LAB — POTASSIUM: Potassium: 4.8 mmol/L (ref 3.5–5.2)

## 2013-06-03 ENCOUNTER — Encounter: Payer: Self-pay | Admitting: Internal Medicine

## 2013-06-03 ENCOUNTER — Other Ambulatory Visit: Payer: Self-pay | Admitting: *Deleted

## 2013-06-17 ENCOUNTER — Encounter: Payer: Self-pay | Admitting: Internal Medicine

## 2013-06-17 ENCOUNTER — Ambulatory Visit (INDEPENDENT_AMBULATORY_CARE_PROVIDER_SITE_OTHER): Payer: 59 | Admitting: Internal Medicine

## 2013-06-17 VITALS — BP 140/80 | HR 54 | Temp 98.0°F | Ht 60.0 in | Wt 229.5 lb

## 2013-06-17 DIAGNOSIS — H409 Unspecified glaucoma: Secondary | ICD-10-CM

## 2013-06-17 DIAGNOSIS — E039 Hypothyroidism, unspecified: Secondary | ICD-10-CM

## 2013-06-17 DIAGNOSIS — R001 Bradycardia, unspecified: Secondary | ICD-10-CM

## 2013-06-17 DIAGNOSIS — I498 Other specified cardiac arrhythmias: Secondary | ICD-10-CM

## 2013-06-17 DIAGNOSIS — E7800 Pure hypercholesterolemia, unspecified: Secondary | ICD-10-CM

## 2013-06-17 DIAGNOSIS — K219 Gastro-esophageal reflux disease without esophagitis: Secondary | ICD-10-CM

## 2013-06-17 DIAGNOSIS — I10 Essential (primary) hypertension: Secondary | ICD-10-CM

## 2013-06-17 DIAGNOSIS — E119 Type 2 diabetes mellitus without complications: Secondary | ICD-10-CM

## 2013-06-22 ENCOUNTER — Encounter: Payer: Self-pay | Admitting: Internal Medicine

## 2013-06-22 NOTE — Progress Notes (Signed)
Subjective:    Patient ID: Lorraine Foley, female    DOB: 06-18-1941, 71 y.o.   MRN: [id number]  HPI 72 year old female with past history of hypertension, hypercholesterolemia and diabetes who comes in today for a scheduled follow up.  She states she has been doing relatively well.  She is doing well from her right hip surgery.   Trying to watch her diet.   Blood pressure checks have been doing well.   States her States blood pressures have been averaging 130s - 140s systolic range.   Feels her breathing is stable.  Overall she feels good.  She is s/p right eye laser surgery.  Has f/u next week with Dr Inez Pilgrim.  Last visit we decreased her amlodipine.  She states she may feel some better with the decrease.     Past Medical History  Diagnosis Date  . Hypertension   . Diabetes mellitus   . Hypercholesterolemia   . Glaucoma   . GERD (gastroesophageal reflux disease)   . Hypothyroidism     Current Outpatient Prescriptions on File Prior to Visit  Medication Sig Dispense Refill  . aspirin 81 MG tablet Take 81 mg by mouth daily.      . brimonidine (ALPHAGAN) 0.2 % ophthalmic solution Place 1 drop into both eyes 2 (two) times daily.      . calcium-vitamin D (CALCIUM 500+D) 500-200 MG-UNIT per tablet Take 1 tablet by mouth daily.      . Cholecalciferol (VITAMIN D-3) 1000 UNITS CAPS Take 1 capsule by mouth daily.      . cloNIDine (CATAPRES) 0.1 MG tablet Take 1 tablet (0.1 mg total) by mouth 3 (three) times daily.  90 tablet  1  . glucosamine-chondroitin 500-400 MG tablet Take 1 tablet by mouth daily.      Marland Kitchen glucose blood test strip accuchek compact test strips Check blood sugar tid      . latanoprost (XALATAN) 0.005 % ophthalmic solution As directed      . LevOCARNitine (L-CARNITINE) 250 MG CAPS Take 1 capsule by mouth daily.      Marland Kitchen levothyroxine (SYNTHROID) 112 MCG tablet Take 1 tablet (112 mcg total) by mouth daily.  90 tablet  3  . Multiple Vitamin (MULTIVITAMIN) tablet Take 1 tablet by  mouth daily.      Marland Kitchen omega-3 acid ethyl esters (LOVAZA) 1 G capsule Take 2 g by mouth daily.      . pantoprazole (PROTONIX) 40 MG tablet Take 1 tablet (40 mg total) by mouth daily.  90 tablet  1  . pravastatin (PRAVACHOL) 40 MG tablet Take 2 tablets (80 mg total) by mouth daily.  90 tablet  1  . quinapril (ACCUPRIL) 40 MG tablet Take 1 tablet (40 mg total) by mouth 2 (two) times daily.  60 tablet  3  . sitaGLIPtin (JANUVIA) 100 MG tablet Take 100 mg by mouth daily.      Marland Kitchen spironolactone-hydrochlorothiazide (ALDACTAZIDE) 25-25 MG per tablet Take 1 tablet by mouth daily.  90 tablet  1   No current facility-administered medications on file prior to visit.    Review of Systems Patient denies any headache, lightheadedness or dizziness.  No sinus or allergy symptoms.  No chest pain, tightness or palpitations.  No increased shortness of breath, cough or congestion.  Breathing stable.  No nausea or vomiting.  No abdominal pain or cramping.  No bowel change, such as diarrhea, constipation, BRBPR or melana.  No urine change. Overall she feels she is doing well.  We discussed diet and exercise.  She is moving and walking more.  Blood pressures as outlined.       Objective:   Physical Exam  Filed Vitals:   06/17/13 1519  BP: 140/80  Pulse: 54  Temp: 98 F (72.37 C)   72 year old female in no acute distress.   HEENT:  Nares- clear.  Oropharynx - without lesions. NECK:  Supple.  Nontender.  No audible bruit.  HEART:  Appears to be regular. LUNGS:  No crackles or wheezing audible.  Respirations even and unlabored.  RADIAL PULSE:  Equal bilaterally.  ABDOMEN:  Soft, nontender.  Bowel sounds present and normal.  No audible abdominal bruit.     EXTREMITIES:  No increased edema present.  DP pulses palpable and equal bilaterally.  FEET:  Without lesions.           Assessment & Plan:  MSK. Doing well s/p right hip surgery.  Seeing Dr Ernest Pine.   CARDIOVASCULAR.  Stable.  Asymptomatic.  Continue risk  factor modification.    PULMONARY.  Breathing stable.  Follow.   GI.  Colonoscopy 9/11 with diverticulosis.  Upper symptoms controlled on Protonix.  IFOB positive.  Due to see GI next week.   LEFT CAROTID BRUIT.  Had carotid ultrasound 6/12 that revealed no hemodynamically significant stenosis.  Continue risk factor modification and daily aspirin.    HEALTH MAINTENANCE.  Physical8/14/14.  Colonoscopy as outlined.   IFOB positive.  Due to see GI next week.  Mammogram 04/25/12 - Birads II.  Information was given for her to schedule her mammogram.  Bone density 05/07/08 normal.

## 2013-06-22 NOTE — Assessment & Plan Note (Signed)
Blood pressure has been doing better. Same meds.  Follow metabolic panel.   

## 2013-06-22 NOTE — Assessment & Plan Note (Signed)
Followed by opthalmology.       

## 2013-06-22 NOTE — Assessment & Plan Note (Signed)
On Januvia.  Continue diet and exercise.  Keep up to date with eye checks. Follow met b and a1c.

## 2013-06-22 NOTE — Assessment & Plan Note (Signed)
Low cholesterol diet and exercise.  Continue current cholesterol medication.  Follow lipid panel and liver function.   

## 2013-06-22 NOTE — Assessment & Plan Note (Signed)
Pulse rate as outlined.  Now on amlodipine 5mg  q day.  Follow blood pressure and pulse.

## 2013-06-22 NOTE — Assessment & Plan Note (Signed)
On Synthroid.  Follow TSH.   

## 2013-06-22 NOTE — Assessment & Plan Note (Signed)
Upper symptoms controlled on Protonix.  Follow.

## 2013-06-24 ENCOUNTER — Other Ambulatory Visit: Payer: Self-pay | Admitting: Internal Medicine

## 2013-06-24 ENCOUNTER — Encounter: Payer: Self-pay | Admitting: Internal Medicine

## 2013-06-25 ENCOUNTER — Other Ambulatory Visit: Payer: Self-pay | Admitting: *Deleted

## 2013-06-25 MED ORDER — PANTOPRAZOLE SODIUM 40 MG PO TBEC: 40.0000 mg | DELAYED_RELEASE_TABLET | Freq: Every day | ORAL | Status: DC

## 2013-06-25 MED ORDER — SPIRONOLACTONE-HCTZ 25-25 MG PO TABS: 1.0000 | ORAL_TABLET | Freq: Every day | ORAL | Status: DC

## 2013-07-08 ENCOUNTER — Ambulatory Visit: Payer: Self-pay | Admitting: Gastroenterology

## 2013-07-08 LAB — HM COLONOSCOPY

## 2013-08-05 ENCOUNTER — Encounter: Payer: Self-pay | Admitting: Internal Medicine

## 2013-08-17 ENCOUNTER — Encounter: Payer: Self-pay | Admitting: Internal Medicine

## 2013-08-19 NOTE — Telephone Encounter (Signed)
Pt's myChart deactivated per request.

## 2013-09-17 ENCOUNTER — Ambulatory Visit (INDEPENDENT_AMBULATORY_CARE_PROVIDER_SITE_OTHER): Payer: 59 | Admitting: Internal Medicine

## 2013-09-17 ENCOUNTER — Encounter: Payer: Self-pay | Admitting: Internal Medicine

## 2013-09-17 VITALS — BP 120/70 | HR 61 | Temp 98.4°F | Ht 60.0 in | Wt 231.0 lb

## 2013-09-17 DIAGNOSIS — K219 Gastro-esophageal reflux disease without esophagitis: Secondary | ICD-10-CM

## 2013-09-17 DIAGNOSIS — H409 Unspecified glaucoma: Secondary | ICD-10-CM

## 2013-09-17 DIAGNOSIS — I10 Essential (primary) hypertension: Secondary | ICD-10-CM

## 2013-09-17 DIAGNOSIS — E7800 Pure hypercholesterolemia, unspecified: Secondary | ICD-10-CM

## 2013-09-17 DIAGNOSIS — E039 Hypothyroidism, unspecified: Secondary | ICD-10-CM

## 2013-09-17 DIAGNOSIS — I498 Other specified cardiac arrhythmias: Secondary | ICD-10-CM

## 2013-09-17 DIAGNOSIS — E119 Type 2 diabetes mellitus without complications: Secondary | ICD-10-CM

## 2013-09-17 DIAGNOSIS — R001 Bradycardia, unspecified: Secondary | ICD-10-CM

## 2013-09-17 NOTE — Progress Notes (Signed)
Pre-visit discussion using our clinic review tool. No additional management support is needed unless otherwise documented below in the visit note.  

## 2013-09-19 ENCOUNTER — Other Ambulatory Visit: Payer: Self-pay | Admitting: Internal Medicine

## 2013-09-20 LAB — LIPID PANEL W/O CHOL/HDL RATIO
CHOLESTEROL TOTAL: 207 mg/dL — AB (ref 100–199)
HDL: 45 mg/dL (ref >39)
LDL Calculated: 130 mg/dL — ABNORMAL HIGH (ref 0–99)
TRIGLYCERIDES: 161 mg/dL — AB (ref 0–149)
VLDL Cholesterol Cal: 32 mg/dL (ref 5–40)

## 2013-09-20 LAB — BASIC METABOLIC PANEL
BUN/Creatinine Ratio: 19 (ref 11–26)
BUN: 23 mg/dL (ref 8–27)
CO2: 30 mmol/L — AB (ref 18–29)
CREATININE: 1.19 mg/dL — AB (ref 0.57–1.00)
Calcium: 9.8 mg/dL (ref 8.7–10.3)
Chloride: 97 mmol/L (ref 97–108)
GFR calc Af Amer: 53 mL/min/{1.73_m2} — ABNORMAL LOW (ref >59)
GFR calc non Af Amer: 46 mL/min/{1.73_m2} — ABNORMAL LOW (ref >59)
GLUCOSE: 153 mg/dL — AB (ref 65–99)
Potassium: 4.5 mmol/L (ref 3.5–5.2)
Sodium: 142 mmol/L (ref 134–144)

## 2013-09-20 LAB — MICROSCOPIC EXAMINATION: RBC, UA: NONE SEEN /hpf (ref 0–?)

## 2013-09-20 LAB — URINALYSIS, ROUTINE W REFLEX MICROSCOPIC
BILIRUBIN UA: NEGATIVE
Glucose, UA: NEGATIVE
KETONES UA: NEGATIVE
Nitrite, UA: NEGATIVE
PH UA: 7 (ref 5.0–7.5)
PROTEIN UA: NEGATIVE
RBC UA: NEGATIVE
Specific Gravity, UA: 1.007 (ref 1.005–1.030)
Urobilinogen, Ur: 0.2 mg/dL (ref 0.0–1.9)

## 2013-09-20 LAB — HEPATIC FUNCTION PANEL
ALK PHOS: 64 IU/L (ref 39–117)
ALT: 20 IU/L (ref 0–32)
AST: 20 IU/L (ref 0–40)
Albumin: 4.9 g/dL — ABNORMAL HIGH (ref 3.5–4.8)
Bilirubin, Direct: 0.15 mg/dL (ref 0.00–0.40)
Total Bilirubin: 0.7 mg/dL (ref 0.0–1.2)
Total Protein: 7.4 g/dL (ref 6.0–8.5)

## 2013-09-20 LAB — TSH: TSH: 2.68 u[IU]/mL (ref 0.450–4.500)

## 2013-09-20 LAB — HGB A1C W/O EAG: Hgb A1c MFr Bld: 8 % — ABNORMAL HIGH (ref 4.8–5.6)

## 2013-09-22 ENCOUNTER — Encounter: Payer: Self-pay | Admitting: Internal Medicine

## 2013-09-22 NOTE — Assessment & Plan Note (Signed)
Upper symptoms controlled on Protonix.  Follow.  EGD as outlined.    

## 2013-09-22 NOTE — Assessment & Plan Note (Signed)
On Januvia.  Continue diet and exercise.  Keep up to date with eye checks.  Sees Dr Wallace Going.  Follow met b and a1c.

## 2013-09-22 NOTE — Assessment & Plan Note (Signed)
On Synthroid.  Follow TSH.   

## 2013-09-22 NOTE — Assessment & Plan Note (Signed)
Blood pressure has been doing better. Same meds.  Follow metabolic panel.   

## 2013-09-22 NOTE — Assessment & Plan Note (Signed)
Low cholesterol diet and exercise.  Continue current cholesterol medication.  Follow lipid panel and liver function.   

## 2013-09-22 NOTE — Progress Notes (Signed)
Subjective:    Patient ID: Lorraine Foley, female    DOB: [birth year], 73 y.o.   MRN: [id number]  HPI 73 year old female with past history of hypertension, hypercholesterolemia and diabetes who comes in today for a scheduled follow up.  She states she has been doing relatively well.  She is doing well from her right hip surgery.   Has not been watching her diet.  Brought in no recorded sugar readings.   Blood pressure checks have been doing well.   Feels her breathing is stable.  Overall she feels good.  She is s/p right eye laser surgery.  Sees Dr Inez Pilgrim.      Past Medical History  Diagnosis Date  . Hypertension   . Diabetes mellitus   . Hypercholesterolemia   . Glaucoma   . GERD (gastroesophageal reflux disease)   . Hypothyroidism     Current Outpatient Prescriptions on File Prior to Visit  Medication Sig Dispense Refill  . amLODipine (NORVASC) 5 MG tablet Take 5 mg by mouth daily.      Marland Kitchen aspirin 81 MG tablet Take 81 mg by mouth daily.      . brimonidine (ALPHAGAN) 0.2 % ophthalmic solution Place 1 drop into both eyes 2 (two) times daily.      . calcium-vitamin D (CALCIUM 500+D) 500-200 MG-UNIT per tablet Take 1 tablet by mouth daily.      . Cholecalciferol (VITAMIN D-3) 1000 UNITS CAPS Take 1 capsule by mouth daily.      . cloNIDine (CATAPRES) 0.1 MG tablet Take 1 tablet by mouth 3  (three) times daily.  90 tablet  5  . glucosamine-chondroitin 500-400 MG tablet Take 1 tablet by mouth daily.      Marland Kitchen glucose blood test strip accuchek compact test strips Check blood sugar tid      . latanoprost (XALATAN) 0.005 % ophthalmic solution As directed      . LevOCARNitine (L-CARNITINE) 250 MG CAPS Take 1 capsule by mouth daily.      Marland Kitchen levothyroxine (SYNTHROID) 112 MCG tablet Take 1 tablet (112 mcg total) by mouth daily.  90 tablet  3  . Multiple Vitamin (MULTIVITAMIN) tablet Take 1 tablet by mouth daily.      Marland Kitchen omega-3 acid ethyl esters (LOVAZA) 1 G capsule Take 2 g by mouth daily.      .  pantoprazole (PROTONIX) 40 MG tablet Take 1 tablet (40 mg total) by mouth daily.  90 tablet  1  . pravastatin (PRAVACHOL) 40 MG tablet Take 2 tablets (80 mg  total) by mouth daily.  180 tablet  1  . quinapril (ACCUPRIL) 40 MG tablet Take 1 tablet by mouth 2  (two) times daily.  180 tablet  1  . sitaGLIPtin (JANUVIA) 100 MG tablet Take 100 mg by mouth daily.      Marland Kitchen spironolactone-hydrochlorothiazide (ALDACTAZIDE) 25-25 MG per tablet Take 1 tablet by mouth daily.  90 tablet  1   No current facility-administered medications on file prior to visit.    Review of Systems Patient denies any headache, lightheadedness or dizziness.  No sinus or allergy symptoms.  No chest pain, tightness or palpitations.  No increased shortness of breath, cough or congestion.  Breathing stable.  No nausea or vomiting.  No abdominal pain or cramping.  No bowel change, such as diarrhea, constipation, BRBPR or melana.  No urine change. Overall she feels she is doing well.  We discussed diet and exercise.  She is plannin to exercise more.  Blood pressures doing well.      Objective:   Physical Exam  Filed Vitals:   09/17/13 1501  BP: 120/70  Pulse: 61  Temp: 98.4 F (6836.369 C)   73 year old female in no acute distress.   HEENT:  Nares- clear.  Oropharynx - without lesions. NECK:  Supple.  Nontender.  No audible bruit.  HEART:  Appears to be regular. LUNGS:  No crackles or wheezing audible.  Respirations even and unlabored.  RADIAL PULSE:  Equal bilaterally.  ABDOMEN:  Soft, nontender.  Bowel sounds present and normal.  No audible abdominal bruit.     EXTREMITIES:  No increased edema present.  DP pulses palpable and equal bilaterally.  FEET:  Without lesions.           Assessment & Plan:  MSK. Doing well s/p right hip surgery.  Seeing Dr Ernest PineHooten.   CARDIOVASCULAR.  Stable.  Asymptomatic.  Continue risk factor modification.    PULMONARY.  Breathing stable.  Follow.   GI.  Colonoscopy 9/11 with diverticulosis.   Upper symptoms controlled on Protonix.  IFOB positive.  Referred to GI.  Had colonoscopy and EGD.     LEFT CAROTID BRUIT.  Had carotid ultrasound 6/12 that revealed no hemodynamically significant stenosis.  Continue risk factor modification and daily aspirin.    HEALTH MAINTENANCE.  Physical 04/11/13.  Colonoscopy as outlined.    Mammogram 04/25/12 - Birads II.  Bone density 05/07/08 normal.   Need last mammogram results.

## 2013-09-22 NOTE — Assessment & Plan Note (Signed)
Now on amlodipine 5mg q day.  Follow blood pressure and pulse.   

## 2013-09-22 NOTE — Assessment & Plan Note (Signed)
Followed by opthalmology.       

## 2013-09-30 ENCOUNTER — Ambulatory Visit (INDEPENDENT_AMBULATORY_CARE_PROVIDER_SITE_OTHER): Payer: 59 | Admitting: Internal Medicine

## 2013-09-30 ENCOUNTER — Encounter: Payer: Self-pay | Admitting: Internal Medicine

## 2013-09-30 VITALS — BP 110/70 | HR 53 | Temp 98.0°F | Wt 230.5 lb

## 2013-09-30 DIAGNOSIS — I10 Essential (primary) hypertension: Secondary | ICD-10-CM

## 2013-09-30 DIAGNOSIS — E119 Type 2 diabetes mellitus without complications: Secondary | ICD-10-CM

## 2013-10-06 ENCOUNTER — Encounter: Payer: Self-pay | Admitting: Internal Medicine

## 2013-10-06 NOTE — Progress Notes (Signed)
Subjective:    Patient ID: Lorraine Foley, female    DOB: 06/04/1941, 73 y.o.   MRN: [id number]  HPI 73 year old female with past history of hypertension, hypercholesterolemia and diabetes who comes in today as a work in to discuss her blood sugars.   She states she has been doing relatively well.   Blood pressure checks have been doing well.   Feels her breathing is stable.  Overall she feels good.  She is s/p right eye laser surgery.  Sees Dr Inez PilgrimBrasington.   Her recent a1c (09/19/13) 8.0.  Since finding out about her lab results, she has adjusted her diet and is walking more.  Brought in some sugar readings.  AM sugar readings recently 120-130s and pm sugars 100-130.  Improved.  Overall feels better.     Past Medical History  Diagnosis Date  . Hypertension   . Diabetes mellitus   . Hypercholesterolemia   . Glaucoma   . GERD (gastroesophageal reflux disease)   . Hypothyroidism     Current Outpatient Prescriptions on File Prior to Visit  Medication Sig Dispense Refill  . amLODipine (NORVASC) 5 MG tablet Take 5 mg by mouth daily.      Marland Kitchen. aspirin 81 MG tablet Take 81 mg by mouth daily.      . brimonidine (ALPHAGAN) 0.2 % ophthalmic solution Place 1 drop into both eyes 2 (two) times daily.      . calcium-vitamin D (CALCIUM 500+D) 500-200 MG-UNIT per tablet Take 1 tablet by mouth daily.      . Cholecalciferol (VITAMIN D-3) 1000 UNITS CAPS Take 1 capsule by mouth daily.      . cloNIDine (CATAPRES) 0.1 MG tablet Take 1 tablet by mouth 3  (three) times daily.  90 tablet  5  . glucosamine-chondroitin 500-400 MG tablet Take 1 tablet by mouth daily.      Marland Kitchen. glucose blood test strip accuchek compact test strips Check blood sugar tid      . latanoprost (XALATAN) 0.005 % ophthalmic solution As directed      . LevOCARNitine (L-CARNITINE) 250 MG CAPS Take 1 capsule by mouth daily.      Marland Kitchen. levothyroxine (SYNTHROID) 112 MCG tablet Take 1 tablet (112 mcg total) by mouth daily.  90 tablet  3  . Multiple Vitamin  (MULTIVITAMIN) tablet Take 1 tablet by mouth daily.      Marland Kitchen. omega-3 acid ethyl esters (LOVAZA) 1 G capsule Take 2 g by mouth daily.      . pantoprazole (PROTONIX) 40 MG tablet Take 1 tablet (40 mg total) by mouth daily.  90 tablet  1  . pravastatin (PRAVACHOL) 40 MG tablet Take 2 tablets (80 mg  total) by mouth daily.  180 tablet  1  . quinapril (ACCUPRIL) 40 MG tablet Take 1 tablet by mouth 2  (two) times daily.  180 tablet  1  . sitaGLIPtin (JANUVIA) 100 MG tablet Take 100 mg by mouth daily.      Marland Kitchen. spironolactone-hydrochlorothiazide (ALDACTAZIDE) 25-25 MG per tablet Take 1 tablet by mouth daily.  90 tablet  1   No current facility-administered medications on file prior to visit.    Review of Systems Patient denies any headache, lightheadedness or dizziness.  No sinus or allergy symptoms.  No chest pain, tightness or palpitations.  No increased shortness of breath, cough or congestion.  Breathing stable.  No nausea or vomiting.  No abdominal pain or cramping.  No bowel change, such as diarrhea, constipation, BRBPR or melana.  No urine change. Overall she feels she is doing well.  We discussed diet and exercise.  She has adjusted her diet and has started exercising more.  Blood pressures doing well.      Objective:   Physical Exam  Filed Vitals:   09/30/13 1655  BP: 110/70  Pulse: 53  Temp: 98 F (36.7 C)   Blood pressure recheck:  72/26  73 year old female in no acute distress.   HEENT:  Nares- clear.  Oropharynx - without lesions. NECK:  Supple.  Nontender.  No audible bruit.  HEART:  Appears to be regular. LUNGS:  No crackles or wheezing audible.  Respirations even and unlabored.  RADIAL PULSE:  Equal bilaterally.  ABDOMEN:  Soft, nontender.  Bowel sounds present and normal.  No audible abdominal bruit.     EXTREMITIES:  No increased edema present.  DP pulses palpable and equal bilaterally.  FEET:  Without lesions.           Assessment & Plan:  MSK. Doing well s/p right hip  surgery.  Seeing Dr Ernest Pine.   CARDIOVASCULAR.  Stable.  Asymptomatic.  Continue risk factor modification.    PULMONARY.  Breathing stable.  Follow.   GI.  Colonoscopy 9/11 with diverticulosis.  Upper symptoms controlled on Protonix.  IFOB positive.  Referred to GI.  Had colonoscopy and EGD.     LEFT CAROTID BRUIT.  Had carotid ultrasound 6/12 that revealed no hemodynamically significant stenosis.  Continue risk factor modification and daily aspirin.    HEALTH MAINTENANCE.  Physical 04/11/13.  Colonoscopy as outlined.    Mammogram 04/25/12 - Birads II.  Bone density 05/07/08 normal.   Need last mammogram results.

## 2013-10-06 NOTE — Assessment & Plan Note (Signed)
On Januvia.  Sugars improved as outlined.  Will hold on making adjustments in her medication since improved.  She has adjusted her diet.  Exercising more.  Continue diet and exercise.  Keep up to date with eye checks.  Sees Dr Wallace Going.  Follow met b and a1c.

## 2013-10-06 NOTE — Assessment & Plan Note (Signed)
Blood pressure has been doing better. Same meds.  Follow metabolic panel.   

## 2013-10-15 ENCOUNTER — Other Ambulatory Visit: Payer: Self-pay | Admitting: Internal Medicine

## 2013-11-05 ENCOUNTER — Encounter: Payer: Self-pay | Admitting: Internal Medicine

## 2013-11-05 ENCOUNTER — Ambulatory Visit (INDEPENDENT_AMBULATORY_CARE_PROVIDER_SITE_OTHER): Payer: 59 | Admitting: Internal Medicine

## 2013-11-05 VITALS — BP 120/60 | HR 53 | Temp 98.7°F | Ht 60.0 in | Wt 230.5 lb

## 2013-11-05 DIAGNOSIS — I10 Essential (primary) hypertension: Secondary | ICD-10-CM

## 2013-11-05 DIAGNOSIS — E119 Type 2 diabetes mellitus without complications: Secondary | ICD-10-CM

## 2013-11-05 DIAGNOSIS — Z23 Encounter for immunization: Secondary | ICD-10-CM

## 2013-11-05 DIAGNOSIS — E7800 Pure hypercholesterolemia, unspecified: Secondary | ICD-10-CM

## 2013-11-05 NOTE — Progress Notes (Signed)
Pre-visit discussion using our clinic review tool. No additional management support is needed unless otherwise documented below in the visit note.  

## 2013-11-09 ENCOUNTER — Encounter: Payer: Self-pay | Admitting: Internal Medicine

## 2013-11-09 NOTE — Assessment & Plan Note (Signed)
Blood pressure has been doing better. Same meds.  Follow metabolic panel.   

## 2013-11-09 NOTE — Assessment & Plan Note (Signed)
Low cholesterol diet and exercise.  Continue current cholesterol medication.  Follow lipid panel and liver function.   

## 2013-11-09 NOTE — Progress Notes (Signed)
Subjective:    Patient ID: Lorraine Foley, female    DOB: 11/16/1940, 73 y.o.   MRN: [id number]  HPI 73 year old female with past history of hypertension, hypercholesterolemia and diabetes who comes in today as a work in to discuss her blood sugars.   She states she has been doing relatively well.   Blood pressure checks have been doing well.   Feels her breathing is stable.  Overall she feels good.  She is s/p right eye laser surgery.  Sees Dr Inez Pilgrim.   Her recent a1c (09/19/13) 8.0.  Since finding out about her lab results, she has adjusted her diet and is walking more.  Brought in some sugar readings.  AM sugar readings recently 1400-160 and pm sugars 120-140.  Improved.  Overall feels better.  Trying to do better watching her diet and exercising.      Past Medical History  Diagnosis Date  . Hypertension   . Diabetes mellitus   . Hypercholesterolemia   . Glaucoma   . GERD (gastroesophageal reflux disease)   . Hypothyroidism     Current Outpatient Prescriptions on File Prior to Visit  Medication Sig Dispense Refill  . amLODipine (NORVASC) 5 MG tablet Take 5 mg by mouth daily.      Marland Kitchen aspirin 81 MG tablet Take 81 mg by mouth daily.      . brimonidine (ALPHAGAN) 0.2 % ophthalmic solution Place 1 drop into both eyes 2 (two) times daily.      . calcium-vitamin D (CALCIUM 500+D) 500-200 MG-UNIT per tablet Take 1 tablet by mouth daily.      . Cholecalciferol (VITAMIN D-3) 1000 UNITS CAPS Take 1 capsule by mouth daily.      . cloNIDine (CATAPRES) 0.1 MG tablet Take 1 tablet by mouth 3  (three) times daily.  90 tablet  5  . glucosamine-chondroitin 500-400 MG tablet Take 1 tablet by mouth daily.      Marland Kitchen glucose blood test strip accuchek compact test strips Check blood sugar tid      . latanoprost (XALATAN) 0.005 % ophthalmic solution As directed      . LevOCARNitine (L-CARNITINE) 250 MG CAPS Take 1 capsule by mouth daily.      Marland Kitchen levothyroxine (SYNTHROID) 112 MCG tablet Take 1 tablet (112 mcg  total) by mouth daily.  90 tablet  3  . Multiple Vitamin (MULTIVITAMIN) tablet Take 1 tablet by mouth daily.      Marland Kitchen omega-3 acid ethyl esters (LOVAZA) 1 G capsule Take 2 g by mouth daily.      . pantoprazole (PROTONIX) 40 MG tablet Take 1 tablet (40 mg total) by mouth daily.  90 tablet  1  . pravastatin (PRAVACHOL) 40 MG tablet Take 2 tablets (80 mg  total) by mouth daily.  180 tablet  1  . quinapril (ACCUPRIL) 40 MG tablet Take 1 tablet by mouth two  times daily  180 tablet  1  . sitaGLIPtin (JANUVIA) 100 MG tablet Take 100 mg by mouth daily.      Marland Kitchen spironolactone-hydrochlorothiazide (ALDACTAZIDE) 25-25 MG per tablet Take 1 tablet by mouth daily.  90 tablet  1   No current facility-administered medications on file prior to visit.    Review of Systems Patient denies any headache, lightheadedness or dizziness.  No sinus or allergy symptoms.  No chest pain, tightness or palpitations.  No increased shortness of breath, cough or congestion.  Breathing stable.  No nausea or vomiting.  No abdominal pain or cramping.  No bowel change, such as diarrhea, constipation, BRBPR or melana.  No urine change. Overall she feels she is doing well.  We discussed diet and exercise.  She has adjusted her diet and has started exercising more.  Blood pressures doing well.      Objective:   Physical Exam  Filed Vitals:   11/05/13 1456  BP: 120/60  Pulse: 53  Temp: 98.7 F (37.1 C)   Blood pressure recheck:  72138/7568  73 year old female in no acute distress.   HEENT:  Nares- clear.  Oropharynx - without lesions. NECK:  Supple.  Nontender.  No audible bruit.  HEART:  Appears to be regular. LUNGS:  No crackles or wheezing audible.  Respirations even and unlabored.  RADIAL PULSE:  Equal bilaterally.  ABDOMEN:  Soft, nontender.  Bowel sounds present and normal.  No audible abdominal bruit.     EXTREMITIES:  No increased edema present.  DP pulses palpable and equal bilaterally.  FEET:  Without lesions.            Assessment & Plan:  MSK. Doing well s/p right hip surgery.  Seeing Dr Ernest PineHooten.   CARDIOVASCULAR.  Stable.  Asymptomatic.  Continue risk factor modification.    PULMONARY.  Breathing stable.  Follow.   GI.  Colonoscopy 9/11 with diverticulosis.  Upper symptoms controlled on Protonix.  IFOB positive.  Referred to GI.  Had colonoscopy and EGD.     LEFT CAROTID BRUIT.  Had carotid ultrasound 6/12 that revealed no hemodynamically significant stenosis.  Continue risk factor modification and daily aspirin.    HEALTH MAINTENANCE.  Physical 04/11/13.  Colonoscopy as outlined.    Mammogram 04/25/12 - Birads II.  Bone density 05/07/08 normal.   Need last mammogram results.

## 2013-11-09 NOTE — Assessment & Plan Note (Signed)
On Januvia.  Sugars improved as outlined.  Will hold on making adjustments in her medication since improved.  She has adjusted her diet.  Exercising more.  Continue diet and exercise.  Keep up to date with eye checks.  Sees Dr Wallace Going.  Follow met b and a1c.

## 2013-11-22 ENCOUNTER — Encounter: Payer: Self-pay | Admitting: *Deleted

## 2013-11-22 ENCOUNTER — Telehealth: Payer: Self-pay | Admitting: Internal Medicine

## 2013-11-22 NOTE — Telephone Encounter (Signed)
Letter & [facility] form mailed to patient

## 2013-11-22 NOTE — Telephone Encounter (Signed)
Notify pt that I reviewed her [facility] labs and her a1c has improved.  Good job.  Her liver function test is within normal limits.  Her Cr is slightly elevated.  Needs to stay hydrated.  Avoid antiinflammatories.  Needs to have her cr rechecked within the next several days to confirm improved/stable.  Form for lab in your box.    Thanks.

## 2014-01-16 ENCOUNTER — Ambulatory Visit (INDEPENDENT_AMBULATORY_CARE_PROVIDER_SITE_OTHER): Payer: 59 | Admitting: Internal Medicine

## 2014-01-16 ENCOUNTER — Encounter: Payer: Self-pay | Admitting: Internal Medicine

## 2014-01-16 VITALS — BP 120/60 | HR 52 | Temp 97.9°F | Ht 60.0 in | Wt 223.5 lb

## 2014-01-16 DIAGNOSIS — E7800 Pure hypercholesterolemia, unspecified: Secondary | ICD-10-CM

## 2014-01-16 DIAGNOSIS — E039 Hypothyroidism, unspecified: Secondary | ICD-10-CM

## 2014-01-16 DIAGNOSIS — I498 Other specified cardiac arrhythmias: Secondary | ICD-10-CM

## 2014-01-16 DIAGNOSIS — H409 Unspecified glaucoma: Secondary | ICD-10-CM

## 2014-01-16 DIAGNOSIS — E119 Type 2 diabetes mellitus without complications: Secondary | ICD-10-CM

## 2014-01-16 DIAGNOSIS — K219 Gastro-esophageal reflux disease without esophagitis: Secondary | ICD-10-CM

## 2014-01-16 DIAGNOSIS — I10 Essential (primary) hypertension: Secondary | ICD-10-CM

## 2014-01-16 DIAGNOSIS — R001 Bradycardia, unspecified: Secondary | ICD-10-CM

## 2014-01-16 NOTE — Progress Notes (Signed)
Pre visit review using our clinic review tool, if applicable. No additional management support is needed unless otherwise documented below in the visit note. 

## 2014-01-20 ENCOUNTER — Encounter: Payer: Self-pay | Admitting: Internal Medicine

## 2014-01-20 NOTE — Assessment & Plan Note (Signed)
Low cholesterol diet and exercise.  Continue current cholesterol medication.  Follow lipid panel and liver function.

## 2014-01-20 NOTE — Assessment & Plan Note (Signed)
On Januvia.  Sugars improved as outlined.  Will hold on making adjustments in her medication since improved.  She has adjusted her diet.  Exercising more.  Continue diet and exercise.  Keep up to date with eye checks.  Sees Dr Wallace Going.  Follow met b and a1c.

## 2014-01-20 NOTE — Assessment & Plan Note (Signed)
On Synthroid.  Follow TSH.   

## 2014-01-20 NOTE — Assessment & Plan Note (Signed)
Now on amlodipine 5mg  q day.  Follow blood pressure and pulse.

## 2014-01-20 NOTE — Assessment & Plan Note (Signed)
Blood pressure has been doing better. Same meds.  Follow metabolic panel.

## 2014-01-20 NOTE — Progress Notes (Signed)
Subjective:    Patient ID: Lorraine Foley, female    DOB: [birth year], 73 y.o.   MRN: [id number]  HPI 73 year old female with past history of hypertension, hypercholesterolemia and diabetes who comes in today for a scheduled follow up.  She states she has been doing relatively well.   Blood pressure checks have been doing well.   Feels her breathing is stable.  Overall she feels good.  She is s/p right eye laser surgery.  Sees Dr Inez Pilgrim.   Her last a1c (09/19/13) 8.0.  Since finding out about her lab results, she has adjusted her diet and is walking more.  Brought in some sugar readings.  AM sugar readings recently 150sand pm sugars 120-140.  Improved.  Overall feels better.  Trying to do better watching her diet and exercising.      Past Medical History  Diagnosis Date  . Hypertension   . Diabetes mellitus   . Hypercholesterolemia   . Glaucoma   . GERD (gastroesophageal reflux disease)   . Hypothyroidism     Current Outpatient Prescriptions on File Prior to Visit  Medication Sig Dispense Refill  . amLODipine (NORVASC) 5 MG tablet Take 5 mg by mouth daily.      Marland Kitchen aspirin 81 MG tablet Take 81 mg by mouth daily.      . brimonidine (ALPHAGAN) 0.2 % ophthalmic solution Place 1 drop into both eyes 2 (two) times daily.      . calcium-vitamin D (CALCIUM 500+D) 500-200 MG-UNIT per tablet Take 1 tablet by mouth daily.      . Cholecalciferol (VITAMIN D-3) 1000 UNITS CAPS Take 1 capsule by mouth daily.      . cloNIDine (CATAPRES) 0.1 MG tablet Take 1 tablet by mouth 3  (three) times daily.  90 tablet  5  . glucosamine-chondroitin 500-400 MG tablet Take 1 tablet by mouth daily.      Marland Kitchen glucose blood test strip accuchek compact test strips Check blood sugar tid      . latanoprost (XALATAN) 0.005 % ophthalmic solution As directed      . LevOCARNitine (L-CARNITINE) 250 MG CAPS Take 1 capsule by mouth daily.      Marland Kitchen levothyroxine (SYNTHROID) 112 MCG tablet Take 1 tablet (112 mcg total) by mouth daily.   90 tablet  3  . Multiple Vitamin (MULTIVITAMIN) tablet Take 1 tablet by mouth daily.      Marland Kitchen omega-3 acid ethyl esters (LOVAZA) 1 G capsule Take 2 g by mouth daily.      . pantoprazole (PROTONIX) 40 MG tablet Take 1 tablet (40 mg total) by mouth daily.  90 tablet  1  . pravastatin (PRAVACHOL) 40 MG tablet Take 2 tablets (80 mg  total) by mouth daily.  180 tablet  1  . quinapril (ACCUPRIL) 40 MG tablet Take 1 tablet by mouth two  times daily  180 tablet  1  . sitaGLIPtin (JANUVIA) 100 MG tablet Take 100 mg by mouth daily.      Marland Kitchen spironolactone-hydrochlorothiazide (ALDACTAZIDE) 25-25 MG per tablet Take 1 tablet by mouth daily.  90 tablet  1   No current facility-administered medications on file prior to visit.    Review of Systems Patient denies any headache, lightheadedness or dizziness.  No sinus or allergy symptoms.  No chest pain, tightness or palpitations.  No increased shortness of breath, cough or congestion.  Breathing stable.  No nausea or vomiting.  No abdominal pain or cramping.  No bowel change, such as diarrhea,  constipation, BRBPR or melana.  No urine change. Overall she feels she is doing well.   She has adjusted her diet and has started exercising more.  Blood pressures doing well.      Objective:   Physical Exam  Filed Vitals:   01/16/14 1514  BP: 120/60  Pulse: 52  Temp: 97.9 F (36.6 C)   Blood pressure recheck:  134/68, pulse 5360  73 year old female in no acute distress.   HEENT:  Nares- clear.  Oropharynx - without lesions. NECK:  Supple.  Nontender.  No audible bruit.  HEART:  Appears to be regular. LUNGS:  No crackles or wheezing audible.  Respirations even and unlabored.  RADIAL PULSE:  Equal bilaterally.  ABDOMEN:  Soft, nontender.  Bowel sounds present and normal.  No audible abdominal bruit.     EXTREMITIES:  No increased edema present.  DP pulses palpable and equal bilaterally.  FEET:  Without lesions.           Assessment & Plan:  MSK. Doing well s/p  right hip surgery.  Seeing Dr Ernest PineHooten.   CARDIOVASCULAR.  Stable.  Asymptomatic.  Continue risk factor modification.    PULMONARY.  Breathing stable.  Follow.   GI.  Colonoscopy 9/11 with diverticulosis.  Upper symptoms controlled on Protonix.  IFOB positive.  Referred to GI.  Had colonoscopy and EGD.     LEFT CAROTID BRUIT.  Had carotid ultrasound 6/12 that revealed no hemodynamically significant stenosis.  Continue risk factor modification and daily aspirin.    HEALTH MAINTENANCE.  Physical 04/11/13.  Colonoscopy as outlined.    Mammogram 05/06/13 - Birads I.  Bone density 05/07/08 normal.

## 2014-01-20 NOTE — Assessment & Plan Note (Signed)
Followed by opthalmology.       

## 2014-01-20 NOTE — Assessment & Plan Note (Signed)
Upper symptoms controlled on Protonix.  Follow.  EGD as outlined.

## 2014-01-21 ENCOUNTER — Encounter: Payer: Self-pay | Admitting: Internal Medicine

## 2014-01-28 ENCOUNTER — Encounter: Payer: Self-pay | Admitting: Internal Medicine

## 2014-01-28 ENCOUNTER — Ambulatory Visit (INDEPENDENT_AMBULATORY_CARE_PROVIDER_SITE_OTHER): Payer: 59 | Admitting: Internal Medicine

## 2014-01-28 VITALS — BP 128/60 | HR 60 | Temp 98.8°F | Resp 16 | Ht 60.0 in | Wt 223.0 lb

## 2014-01-28 DIAGNOSIS — B9789 Other viral agents as the cause of diseases classified elsewhere: Principal | ICD-10-CM

## 2014-01-28 DIAGNOSIS — J069 Acute upper respiratory infection, unspecified: Secondary | ICD-10-CM | POA: Insufficient documentation

## 2014-01-28 MED ORDER — GUAIFENESIN-CODEINE 100-10 MG/5ML PO SYRP: 5.0000 mL | ORAL_SOLUTION | Freq: Three times a day (TID) | ORAL | Status: DC | PRN

## 2014-01-28 MED ORDER — PREDNISONE (PAK) 10 MG PO TABS: ORAL_TABLET | ORAL | Status: DC

## 2014-01-28 MED ORDER — MOMETASONE FUROATE 0.1 % EX OINT: TOPICAL_OINTMENT | Freq: Every day | CUTANEOUS | Status: DC

## 2014-01-28 NOTE — Progress Notes (Signed)
Patient ID: Lorraine Foley, female   DOB: 07/15/1941, 73 y.o.   MRN: [id number]   Patient Active Problem List   Diagnosis Date Noted  . Viral URI with cough 01/28/2014  . Bradycardia 04/13/2013  . Diabetes mellitus 08/10/2012  . Hypertension 08/10/2012  . Hypercholesterolemia 08/10/2012  . Glaucoma 08/10/2012  . GERD (gastroesophageal reflux disease) 08/10/2012  . Hypothyroidism 08/10/2012    Subjective:  CC:   Chief Complaint  Patient presents with  . Cough    productive yellow mucus, voice is raspy.    HPI:   Lorraine Foley is a 73 y.o. female who presents for Cough started on Sunday afternoon. Positive sick contacts at home..  No fevers.,or sputum,  Nasal drainage has been clear .lost her voice today,  Has been taking guaifenesin  With no significant change.    Past Medical History  Diagnosis Date  . Hypertension   . Diabetes mellitus   . Hypercholesterolemia   . Glaucoma   . GERD (gastroesophageal reflux disease)   . Hypothyroidism     Past Surgical History  Procedure Laterality Date  . 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    Review of Systems:   Patient denies headache, fevers, malaise, unintentional weight loss, skin rash, eye pain, sinus congestion and sinus pain, sore throat, dysphagia,  hemoptysis , cough, dyspnea, wheezing, chest pain, palpitations, orthopnea, edema, abdominal pain, nausea, melena, diarrhea, constipation, flank pain, dysuria, hematuria, urinary  Frequency, nocturia, numbness, tingling, seizures,  Focal weakness, Loss of consciousness,  Tremor, insomnia, depression, anxiety, and suicidal ideation.     History   Social History  . Marital Status: Divorced    Spouse Name: N/A    Number of Children: 1  . Years of Education: N/A   Occupational History  . Not on file.   Social History Main  Topics  . Smoking status: Never Smoker   . Smokeless tobacco: Never Used  . Alcohol Use: No  . Drug Use: No  . Sexual Activity: Not on file   Other Topics Concern  . Not on file   Social History Narrative  . No narrative on file    Objective:  Filed Vitals:   01/28/14 1258  BP: 128/60  Pulse: 60  Temp: 98.8 F (37.1 C)  Resp: 16     General appearance: alert, cooperative and appears stated age Ears: normal TM's and external ear canals both ears Throat: lips, mucosa, and tongue normal; teeth and gums normal Neck: no adenopathy, no carotid bruit, supple, symmetrical, trachea midline and thyroid not enlarged, symmetric, no tenderness/mass/nodules Back: symmetric, no curvature. ROM normal. No CVA tenderness. Lungs: clear to auscultation bilaterally Heart: regular rate and rhythm, S1, S2 normal, no murmur, click, rub or gallop Abdomen: soft, non-tender; bowel sounds normal; no masses,  no organomegaly Pulses: 2+ and symmetric Skin: Skin color, texture, turgor normal. No rashes or lesions Lymph nodes: Cervical, supraclavicular, and axillary nodes normal.  Assessment and Plan:  Viral URI with cough This URI is most likely viral .  I have explained that in viral URIS, an antibiotic will not help the symptoms and will increase the risk of developing diarrhea.,  Continue oral and nasal decongestants,  Ibuprofen 400 mg and tylenol 650 mq 8 hrs for aches and pains,  And will addtessalon 100 mg every 8 hours prn cough  And  abx only if symptoms worsen to include fevers, facial pain, purulent sputum./drainage.    Updated Medication List Outpatient Encounter Prescriptions as of 01/28/2014  Medication Sig  . amLODipine (NORVASC) 5 MG tablet Take 5 mg by mouth daily.  Marland Kitchen. aspirin 81 MG tablet Take 81 mg by mouth daily.  . brimonidine (ALPHAGAN) 0.2 % ophthalmic solution Place 1 drop into both eyes 2 (two) times daily.  . calcium-vitamin D (CALCIUM 500+D) 500-200 MG-UNIT per tablet Take 1  tablet by mouth daily.  . Cholecalciferol (VITAMIN D-3) 1000 UNITS CAPS Take 1 capsule by mouth daily.  . cloNIDine (CATAPRES) 0.1 MG tablet Take 1 tablet by mouth 3  (three) times daily.  Marland Kitchen. glucosamine-chondroitin 500-400 MG tablet Take 1 tablet by mouth daily.  Marland Kitchen. glucose blood test strip accuchek compact test strips Check blood sugar tid  . latanoprost (XALATAN) 0.005 % ophthalmic solution As directed  . LevOCARNitine (L-CARNITINE) 250 MG CAPS Take 1 capsule by mouth daily.  Marland Kitchen. levothyroxine (SYNTHROID) 112 MCG tablet Take 1 tablet (112 mcg total) by mouth daily.  . Multiple Vitamin (MULTIVITAMIN) tablet Take 1 tablet by mouth daily.  Marland Kitchen. omega-3 acid ethyl esters (LOVAZA) 1 G capsule Take 2 g by mouth daily.  . pantoprazole (PROTONIX) 40 MG tablet Take 1 tablet (40 mg total) by mouth daily.  . pravastatin (PRAVACHOL) 40 MG tablet Take 2 tablets (80 mg  total) by mouth daily.  . quinapril (ACCUPRIL) 40 MG tablet Take 1 tablet by mouth two  times daily  . sitaGLIPtin (JANUVIA) 100 MG tablet Take 100 mg by mouth daily.  Marland Kitchen. spironolactone-hydrochlorothiazide (ALDACTAZIDE) 25-25 MG per tablet Take 1 tablet by mouth daily.  Marland Kitchen. guaiFENesin-codeine (CHERATUSSIN AC) 100-10 MG/5ML syrup Take 5 mLs by mouth 3 (three) times daily as needed for cough.  . mometasone (ELOCON) 0.1 % ointment Apply topically daily. To ear canal  . predniSONE (STERAPRED UNI-PAK) 10 MG tablet 6 tablets on Day 1 , then reduce by 1 tablet daily until gone     No orders of the defined types were placed in this encounter.    No Follow-up on file.

## 2014-01-28 NOTE — Progress Notes (Signed)
Pre-visit discussion using our clinic review tool. No additional management support is needed unless otherwise documented below in the visit note.  

## 2014-01-28 NOTE — Assessment & Plan Note (Signed)
This URI is most likely viral .  I have explained that in viral URIS, an antibiotic will not help the symptoms and will increase the risk of developing diarrhea.,  Continue oral and nasal decongestants,  Ibuprofen 400 mg and tylenol 650 mq 8 hrs for aches and pains,  And will addtessalon 100 mg every 8 hours prn cough  And  abx only if symptoms worsen to include fevers, facial pain, purulent sputum./drainage.

## 2014-01-28 NOTE — Patient Instructions (Addendum)
You have a viral syndrome which may develop into bronchitis.    The post nasal drip may also be contributing to  your  Cough.  I am prescribing a prednisone taper to manage the cough and prevent  inflammation in your bronchial tubes .  It may raise your blood sugars for a few days   I also advise use of the following OTC meds to help with your other symptoms.   Take generic OTC benadryl 25 mg  At bedtime for the drainage,,  Delsym for daytime cough. flush your sinuses twice daily with Simply Saline   Use the cheratussin for cough not releived with Delsym.   If the coughing has not improved in  48 hours  OR  if you develop T > 100.4,  Green nasal discharge,  Or facial pain, call me back and I will prescribe  antibiotic.  Please take a probiotic ( Align, Floraque or Culturelle) while you are on the antibiotic to prevent a serious antibiotic associated diarrhea  Called clostridium dificile colitis and a vaginal yeast infection   For your "itchy" ears,  I have prescribed a steroid ointment that you can apply to the ear canal with a clean q tip  daily as needed.

## 2014-02-03 ENCOUNTER — Ambulatory Visit (INDEPENDENT_AMBULATORY_CARE_PROVIDER_SITE_OTHER): Payer: 59 | Admitting: Internal Medicine

## 2014-02-03 ENCOUNTER — Encounter: Payer: Self-pay | Admitting: Internal Medicine

## 2014-02-03 VITALS — BP 138/70 | HR 54 | Temp 98.6°F | Ht 60.0 in | Wt 222.5 lb

## 2014-02-03 DIAGNOSIS — H9220 Otorrhagia, unspecified ear: Secondary | ICD-10-CM

## 2014-02-03 DIAGNOSIS — J069 Acute upper respiratory infection, unspecified: Secondary | ICD-10-CM

## 2014-02-03 DIAGNOSIS — I10 Essential (primary) hypertension: Secondary | ICD-10-CM

## 2014-02-03 DIAGNOSIS — H9210 Otorrhea, unspecified ear: Secondary | ICD-10-CM

## 2014-02-03 DIAGNOSIS — H9190 Unspecified hearing loss, unspecified ear: Secondary | ICD-10-CM

## 2014-02-03 MED ORDER — AMOXICILLIN-POT CLAVULANATE 875-125 MG PO TABS: 1.0000 | ORAL_TABLET | Freq: Two times a day (BID) | ORAL | Status: DC

## 2014-02-03 MED ORDER — FLUTICASONE PROPIONATE HFA 110 MCG/ACT IN AERO: 2.0000 | INHALATION_SPRAY | Freq: Two times a day (BID) | RESPIRATORY_TRACT | Status: DC

## 2014-02-03 MED ORDER — ALBUTEROL SULFATE HFA 108 (90 BASE) MCG/ACT IN AERS: 2.0000 | INHALATION_SPRAY | Freq: Four times a day (QID) | RESPIRATORY_TRACT | Status: DC | PRN

## 2014-02-03 NOTE — Patient Instructions (Signed)
Take mucinex DM in the am and robitussin DM in the evening.   nasacort nasal spray - 2 sprays each nostril one time per day.  Do this in the evening.    Saline nasal spray - flush nose at least 2-3x/day.    Flovent inhaler - 2 puffs twice a day  Albuterol inhaler - 2 puffs up to four times per day as needed.    Take the antibiotic twice a day - with food.

## 2014-02-03 NOTE — Progress Notes (Signed)
Pre visit review using our clinic review tool, if applicable. No additional management support is needed unless otherwise documented below in the visit note. 

## 2014-02-09 ENCOUNTER — Encounter: Payer: Self-pay | Admitting: Internal Medicine

## 2014-02-09 NOTE — Assessment & Plan Note (Signed)
Increased cough, congestion and wheezing.  Mucinex DM in the am and robitussin DM in the evening.  Saline nasal spray and steroid nasal spray as directed.  Flovent 110mcg inhaler bid.  Albuterol inhaler prn as directed.  Augmentin 875mg  bid with food.   Follow closely.  Do not feel prednisone warranted at this time.  Follow.

## 2014-02-09 NOTE — Assessment & Plan Note (Signed)
Blood pressure elevated since sick.  Has been under better control.  hohld on making adjustments in her medication.  Follow.  Should get better as treat infection.

## 2014-02-09 NOTE — Progress Notes (Signed)
Subjective:    Patient ID: Lorraine HectorJanet Foley, female    DOB: 1940-10-29, 73 y.o.   MRN: [id number]  Cough  73 year old female with past history of hypertension, hypercholesterolemia and diabetes who comes in today as a work in with concerns regarding persistent cough and congestion.  Cough started eight days ago.  Evaluated last week.  See Dr Melina Schoolsullo's note for details.  Symptoms have progressed.  Complains of some ear itching.  Ears are clogged now.  Decreased hearing.  This happened acutely.  She apparently "squirted the ear medication" she was given in her ear".  She also had some recent bleeding - left ear.  No pain now.  Increased nasal congestion with green mucus production.  Increased drainage.  Increased cough.  Occasional wheezing.  Blood pressure elevated recently.  Had been under better control.  She states she took it when had the increased coughing episodes.      Past Medical History  Diagnosis Date  . Hypertension   . Diabetes mellitus   . Hypercholesterolemia   . Glaucoma   . GERD (gastroesophageal reflux disease)   . Hypothyroidism     Current Outpatient Prescriptions on File Prior to Visit  Medication Sig Dispense Refill  . amLODipine (NORVASC) 5 MG tablet Take 5 mg by mouth daily.      Marland Kitchen. aspirin 81 MG tablet Take 81 mg by mouth daily.      . brimonidine (ALPHAGAN) 0.2 % ophthalmic solution Place 1 drop into both eyes 2 (two) times daily.      . calcium-vitamin D (CALCIUM 500+D) 500-200 MG-UNIT per tablet Take 1 tablet by mouth daily.      . Cholecalciferol (VITAMIN D-3) 1000 UNITS CAPS Take 1 capsule by mouth daily.      . cloNIDine (CATAPRES) 0.1 MG tablet Take 1 tablet by mouth 3  (three) times daily.  90 tablet  5  . glucosamine-chondroitin 500-400 MG tablet Take 1 tablet by mouth daily.      Marland Kitchen. glucose blood test strip accuchek compact test strips Check blood sugar tid      . guaiFENesin-codeine (CHERATUSSIN AC) 100-10 MG/5ML syrup Take 5 mLs by mouth 3 (three) times daily  as needed for cough.  120 mL  0  . latanoprost (XALATAN) 0.005 % ophthalmic solution As directed      . LevOCARNitine (L-CARNITINE) 250 MG CAPS Take 1 capsule by mouth daily.      Marland Kitchen. levothyroxine (SYNTHROID) 112 MCG tablet Take 1 tablet (112 mcg total) by mouth daily.  90 tablet  3  . mometasone (ELOCON) 0.1 % ointment Apply topically daily. To ear canal  45 g  0  . Multiple Vitamin (MULTIVITAMIN) tablet Take 1 tablet by mouth daily.      Marland Kitchen. omega-3 acid ethyl esters (LOVAZA) 1 G capsule Take 2 g by mouth daily.      . pantoprazole (PROTONIX) 40 MG tablet Take 1 tablet (40 mg total) by mouth daily.  90 tablet  1  . pravastatin (PRAVACHOL) 40 MG tablet Take 2 tablets (80 mg  total) by mouth daily.  180 tablet  1  . quinapril (ACCUPRIL) 40 MG tablet Take 1 tablet by mouth two  times daily  180 tablet  1  . sitaGLIPtin (JANUVIA) 100 MG tablet Take 100 mg by mouth daily.      Marland Kitchen. spironolactone-hydrochlorothiazide (ALDACTAZIDE) 25-25 MG per tablet Take 1 tablet by mouth daily.  90 tablet  1   No current facility-administered medications on  file prior to visit.    Review of Systems  Respiratory: Positive for cough.   Patient denies any headache, lightheadedness or dizziness.  Increased nasal congestion and drainage as outlined.  No chest pain, tightness or palpitations.  No increased shortness of breath.  Does report increased cough and congestion.  Some occasional wheezing.    No nausea or vomiting.  No abdominal pain or cramping.  No bowel change, such as diarrhea.  Blood pressures elevated now.       Objective:   Physical Exam  Filed Vitals:   02/03/14 1510  BP: 138/70  Pulse: 54  Temp: 98.6 F (37 C)   Blood pressure recheck:  152/72, pulse 4960  73 year old female in no acute distress.   HEENT:  Nares- erythematous turbinates.   Oropharynx - without lesions.  Ears- unable to visualize TMs.   NECK:  Supple.  Nontender.   HEART:  Appears to be regular. LUNGS:  No crackles.  Increased  cough with forced expiration.           Assessment & Plan:  CARDIOVASCULAR.  Stable.  Asymptomatic.  Continue risk factor modification.    GI.  Colonoscopy 9/11 with diverticulosis.  Upper symptoms controlled on Protonix.  IFOB positive.  Referred to GI.  Had colonoscopy and EGD.     HEALTH MAINTENANCE.  Physical 04/11/13.  Colonoscopy as outlined.    Mammogram 05/06/13 - Birads I.  Bone density 05/07/08 normal.

## 2014-03-05 ENCOUNTER — Other Ambulatory Visit: Payer: Self-pay | Admitting: Internal Medicine

## 2014-03-06 LAB — HEPATIC FUNCTION PANEL
ALT: 17 IU/L (ref 0–32)
AST: 20 IU/L (ref 0–40)
Albumin: 4.7 g/dL (ref 3.5–4.8)
Alkaline Phosphatase: 54 IU/L (ref 39–117)
BILIRUBIN DIRECT: 0.15 mg/dL (ref 0.00–0.40)
BILIRUBIN TOTAL: 0.7 mg/dL (ref 0.0–1.2)
Total Protein: 7.2 g/dL (ref 6.0–8.5)

## 2014-03-06 LAB — BASIC METABOLIC PANEL
BUN/Creatinine Ratio: 23 (ref 11–26)
BUN: 24 mg/dL (ref 8–27)
CHLORIDE: 98 mmol/L (ref 97–108)
CO2: 29 mmol/L (ref 18–29)
CREATININE: 1.05 mg/dL — AB (ref 0.57–1.00)
Calcium: 10.4 mg/dL — ABNORMAL HIGH (ref 8.7–10.3)
GFR calc Af Amer: 61 mL/min/{1.73_m2} (ref >59)
GFR calc non Af Amer: 53 mL/min/{1.73_m2} — ABNORMAL LOW (ref >59)
GLUCOSE: 121 mg/dL — AB (ref 65–99)
Potassium: 4.8 mmol/L (ref 3.5–5.2)
Sodium: 142 mmol/L (ref 134–144)

## 2014-03-06 LAB — LIPID PANEL WITH LDL/HDL RATIO
Cholesterol, Total: 185 mg/dL (ref 100–199)
HDL: 43 mg/dL (ref >39)
LDL Calculated: 112 mg/dL — ABNORMAL HIGH (ref 0–99)
LDl/HDL Ratio: 2.6 ratio units (ref 0.0–3.2)
TRIGLYCERIDES: 149 mg/dL (ref 0–149)
VLDL Cholesterol Cal: 30 mg/dL (ref 5–40)

## 2014-03-06 LAB — HGB A1C W/O EAG: Hgb A1c MFr Bld: 7.5 % — ABNORMAL HIGH (ref 4.8–5.6)

## 2014-03-08 ENCOUNTER — Other Ambulatory Visit: Payer: Self-pay | Admitting: Internal Medicine

## 2014-03-20 ENCOUNTER — Encounter: Payer: Self-pay | Admitting: *Deleted

## 2014-04-25 ENCOUNTER — Ambulatory Visit (INDEPENDENT_AMBULATORY_CARE_PROVIDER_SITE_OTHER): Payer: 59 | Admitting: Internal Medicine

## 2014-04-25 ENCOUNTER — Encounter: Payer: Self-pay | Admitting: Internal Medicine

## 2014-04-25 VITALS — BP 132/70 | HR 59 | Temp 98.2°F | Ht 59.75 in | Wt 219.5 lb

## 2014-04-25 DIAGNOSIS — E039 Hypothyroidism, unspecified: Secondary | ICD-10-CM

## 2014-04-25 DIAGNOSIS — E7800 Pure hypercholesterolemia, unspecified: Secondary | ICD-10-CM

## 2014-04-25 DIAGNOSIS — I10 Essential (primary) hypertension: Secondary | ICD-10-CM

## 2014-04-25 DIAGNOSIS — E119 Type 2 diabetes mellitus without complications: Secondary | ICD-10-CM

## 2014-04-25 DIAGNOSIS — R001 Bradycardia, unspecified: Secondary | ICD-10-CM

## 2014-04-25 DIAGNOSIS — Z1239 Encounter for other screening for malignant neoplasm of breast: Secondary | ICD-10-CM

## 2014-04-25 DIAGNOSIS — I498 Other specified cardiac arrhythmias: Secondary | ICD-10-CM

## 2014-04-25 DIAGNOSIS — H409 Unspecified glaucoma: Secondary | ICD-10-CM

## 2014-04-25 DIAGNOSIS — K219 Gastro-esophageal reflux disease without esophagitis: Secondary | ICD-10-CM

## 2014-04-25 NOTE — Progress Notes (Signed)
Pre visit review using our clinic review tool, if applicable. No additional management support is needed unless otherwise documented below in the visit note. 

## 2014-04-27 ENCOUNTER — Encounter: Payer: Self-pay | Admitting: Internal Medicine

## 2014-04-27 NOTE — Assessment & Plan Note (Signed)
Blood pressure has been doing better. Same meds.  Follow metabolic panel.   

## 2014-04-27 NOTE — Assessment & Plan Note (Signed)
Now on amlodipine 5mg q day.  Follow blood pressure and pulse.   

## 2014-04-27 NOTE — Assessment & Plan Note (Signed)
On Januvia.  Continue diet and exercise.  Needs to get back in her routine.  She plans to get more serious about her diet and exercise.  Keep up to date with eye checks.  Sees Dr Wallace Going.  Follow met b and a1c.

## 2014-04-27 NOTE — Assessment & Plan Note (Signed)
Low cholesterol diet and exercise.  Continue current cholesterol medication.  Follow lipid panel and liver function.   

## 2014-04-27 NOTE — Assessment & Plan Note (Signed)
Followed by opthalmology.       

## 2014-04-27 NOTE — Assessment & Plan Note (Signed)
On Synthroid.  Follow TSH.   

## 2014-04-27 NOTE — Progress Notes (Signed)
Subjective:    Patient ID: Lorraine Foley, female    DOB: 1941-01-18, 73 y.o.   MRN: [id number]  HPI 73 year old female with past history of hypertension, hypercholesterolemia and diabetes who comes in today to follow up on these issues as well as for a complete physical exam.   She states she has been doing relatively well.   Blood pressure checks have been doing well.   Feels her breathing is stable.  Overall she feels good.  She is s/p right eye laser surgery.  Sees Dr Inez Pilgrim.  Brought in no sugar readings.  States she was out of her routine this summer.  Has not been exercising or watching what she eats.  Plans to restart.  Overall feeling better now.  Saw ENT for her ear.  Found to have perforated eardrum and suppurative otitis.  Ear is better.  Still muffled.  No pain.  No chest pain or tightness.     Past Medical History  Diagnosis Date  . Hypertension   . Diabetes mellitus   . Hypercholesterolemia   . Glaucoma   . GERD (gastroesophageal reflux disease)   . Hypothyroidism     Current Outpatient Prescriptions on File Prior to Visit  Medication Sig Dispense Refill  . amLODipine (NORVASC) 5 MG tablet Take 5 mg by mouth daily.      Marland Kitchen aspirin 81 MG tablet Take 81 mg by mouth daily.      . brimonidine (ALPHAGAN) 0.2 % ophthalmic solution Place 1 drop into both eyes 2 (two) times daily.      . calcium-vitamin D (CALCIUM 500+D) 500-200 MG-UNIT per tablet Take 1 tablet by mouth daily.      . Cholecalciferol (VITAMIN D-3) 1000 UNITS CAPS Take 1 capsule by mouth daily.      . cloNIDine (CATAPRES) 0.1 MG tablet Take 1 tablet by mouth 3  times a day  270 tablet  1  . fluticasone (FLOVENT HFA) 110 MCG/ACT inhaler Inhale 2 puffs into the lungs 2 (two) times daily. Please instruct pt on how to use.  1 Inhaler  1  . glucosamine-chondroitin 500-400 MG tablet Take 1 tablet by mouth daily.      Marland Kitchen glucose blood test strip accuchek compact test strips Check blood sugar tid      . latanoprost  (XALATAN) 0.005 % ophthalmic solution As directed      . LevOCARNitine (L-CARNITINE) 250 MG CAPS Take 1 capsule by mouth daily.      Marland Kitchen levothyroxine (SYNTHROID) 112 MCG tablet Take 1 tablet (112 mcg total) by mouth daily.  90 tablet  3  . Multiple Vitamin (MULTIVITAMIN) tablet Take 1 tablet by mouth daily.      Marland Kitchen omega-3 acid ethyl esters (LOVAZA) 1 G capsule Take 2 g by mouth daily.      . pantoprazole (PROTONIX) 40 MG tablet Take 1 tablet by mouth  daily  90 tablet  1  . pravastatin (PRAVACHOL) 40 MG tablet Take 2 tablets by mouth  daily  180 tablet  1  . quinapril (ACCUPRIL) 40 MG tablet Take 1 tablet by mouth two  times daily  180 tablet  1  . sitaGLIPtin (JANUVIA) 100 MG tablet Take 100 mg by mouth daily.      Marland Kitchen spironolactone-hydrochlorothiazide (ALDACTAZIDE) 25-25 MG per tablet Take 1 tablet by mouth  daily  90 tablet  1   No current facility-administered medications on file prior to visit.    Review of Systems Patient denies any  headache, lightheadedness or dizziness.  No sinus or allergy symptoms.  No chest pain, tightness or palpitations.  No increased shortness of breath, cough or congestion.  Breathing stable.  No nausea or vomiting.  No abdominal pain or cramping.  No bowel change, such as diarrhea, constipation, BRBPR or melana.  No urine change. Overall she feels she is doing better.   She plans to adjust her diet and start exercising more.  Blood pressures doing well.      Objective:   Physical Exam  Filed Vitals:   04/25/14 1612  BP: 132/70  Pulse: 59  Temp: 98.2 F (36.8 C)   Blood pressure recheck:  148/78, pulse 63  73 year old female in no acute distress.   HEENT:  Nares- clear.  Oropharynx - without lesions. NECK:  Supple.  Nontender.  No audible bruit.  HEART:  Appears to be regular. LUNGS:  No crackles or wheezing audible.  Respirations even and unlabored.  RADIAL PULSE:  Equal bilaterally.    BREASTS:  No nipple discharge or nipple retraction present.   Could not appreciate any distinct nodules or axillary adenopathy.  ABDOMEN:  Soft, nontender.  Bowel sounds present and normal.  No audible abdominal bruit.  GU:  Not performed.    EXTREMITIES:  No increased edema present.  DP pulses palpable and equal bilaterally.      FEET:  No lesions.       Assessment & Plan:  MSK. Doing well s/p right hip surgery.  Seeing Dr Ernest Pine.   CARDIOVASCULAR.  Stable.  Asymptomatic.  Continue risk factor modification.    PULMONARY.  Breathing stable.  Follow.   GI.  Colonoscopy 9/11 with diverticulosis.  Upper symptoms controlled on Protonix.  IFOB positive.  Referred to GI.  Had f/u colonoscopy and EGD.  Colonoscopy - 11/14.   LEFT CAROTID BRUIT.  Had carotid ultrasound 6/12 that revealed no hemodynamically significant stenosis.  Continue risk factor modification and daily aspirin.    HEALTH MAINTENANCE.  Physical today.  Colonoscopy as outlined.    Mammogram 05/06/13 - Birads I.  Bone density 05/07/08 normal.  Schedule f/u mammogram.  Will let me know when agreeable for f/u bone density.

## 2014-04-27 NOTE — Assessment & Plan Note (Signed)
Upper symptoms controlled on Protonix.  Follow.  EGD normal 07/08/13.

## 2014-06-01 ENCOUNTER — Telehealth: Payer: Self-pay | Admitting: Internal Medicine

## 2014-06-01 NOTE — Telephone Encounter (Signed)
Notify pt that her kidney function is stable (ok).  Remainder of metabolic panel ok.

## 2014-06-02 ENCOUNTER — Encounter: Payer: Self-pay | Admitting: *Deleted

## 2014-06-02 NOTE — Telephone Encounter (Signed)
Letter mailed

## 2014-07-18 ENCOUNTER — Encounter: Payer: Self-pay | Admitting: Internal Medicine

## 2014-07-22 LAB — LIPID PANEL
Cholesterol: 190 mg/dL (ref 0–200)
HDL: 40 mg/dL (ref 35–70)
LDL Cholesterol: 111 mg/dL
Triglycerides: 194 mg/dL — AB (ref 40–160)

## 2014-07-22 LAB — CBC AND DIFFERENTIAL
HCT: 44 % (ref 36–46)
Hemoglobin: 14.7 g/dL (ref 12.0–16.0)
Neutrophils Absolute: 3 /uL
PLATELETS: 251 10*3/uL (ref 150–399)
WBC: 5.6 10*3/mL

## 2014-07-22 LAB — HEPATIC FUNCTION PANEL
ALT: 14 U/L (ref 7–35)
AST: 15 U/L (ref 13–35)
Alkaline Phosphatase: 64 U/L (ref 25–125)
BILIRUBIN DIRECT: 0.15 mg/dL (ref 0.01–0.4)
BILIRUBIN, TOTAL: 0.8 mg/dL

## 2014-07-22 LAB — BASIC METABOLIC PANEL
BUN: 18 mg/dL (ref 4–21)
CREATININE: 1.1 mg/dL (ref 0.5–1.1)
GLUCOSE: 108 mg/dL
POTASSIUM: 4.9 mmol/L (ref 3.4–5.3)
Sodium: 139 mmol/L (ref 137–147)

## 2014-07-22 LAB — TSH: TSH: 0.5 u[IU]/mL (ref 0.41–5.90)

## 2014-07-22 LAB — HEMOGLOBIN A1C: Hgb A1c MFr Bld: 7.1 % — AB (ref 4.0–6.0)

## 2014-07-23 ENCOUNTER — Encounter: Payer: Self-pay | Admitting: Internal Medicine

## 2014-07-28 ENCOUNTER — Encounter: Payer: Self-pay | Admitting: Internal Medicine

## 2014-07-28 ENCOUNTER — Ambulatory Visit (INDEPENDENT_AMBULATORY_CARE_PROVIDER_SITE_OTHER): Payer: 59 | Admitting: Internal Medicine

## 2014-07-28 VITALS — BP 120/60 | HR 56 | Temp 98.1°F | Ht 59.75 in | Wt 219.0 lb

## 2014-07-28 DIAGNOSIS — E7800 Pure hypercholesterolemia, unspecified: Secondary | ICD-10-CM

## 2014-07-28 DIAGNOSIS — K219 Gastro-esophageal reflux disease without esophagitis: Secondary | ICD-10-CM

## 2014-07-28 DIAGNOSIS — Z1239 Encounter for other screening for malignant neoplasm of breast: Secondary | ICD-10-CM

## 2014-07-28 DIAGNOSIS — I10 Essential (primary) hypertension: Secondary | ICD-10-CM

## 2014-07-28 DIAGNOSIS — E119 Type 2 diabetes mellitus without complications: Secondary | ICD-10-CM

## 2014-07-28 DIAGNOSIS — E039 Hypothyroidism, unspecified: Secondary | ICD-10-CM

## 2014-07-28 DIAGNOSIS — H409 Unspecified glaucoma: Secondary | ICD-10-CM

## 2014-07-28 NOTE — Progress Notes (Signed)
Subjective:    Patient ID: Lorraine Foley, female    DOB: [birth year], 73 y.o.   MRN: [id number]  HPI 73 year old female with past history of hypertension, hypercholesterolemia and diabetes who comes in today for a scheduled follow up.   She states she has been doing relatively well.   Blood pressure checks have been doing well.   Feels her breathing is stable.  Overall she feels good.  She is s/p right eye laser surgery.  Sees Dr Inez PilgrimBrasington.  Just evaluated last week.  Brought in no sugar readings.  States her sugars are doing ok.  A1c just checked had decreased - 7.2.  Tries to watch what she eats.  Not exercising regularly.  Plans to restart.  Overall feeling good.   Saw ENT for her ear.  Found to have perforated eardrum and suppurative otitis.  Ear is better.  No chest pain or tightness.    Past Medical History  Diagnosis Date  . Hypertension   . Diabetes mellitus   . Hypercholesterolemia   . Glaucoma   . GERD (gastroesophageal reflux disease)   . Hypothyroidism     Current Outpatient Prescriptions on File Prior to Visit  Medication Sig Dispense Refill  . amLODipine (NORVASC) 5 MG tablet Take 5 mg by mouth daily.    Marland Kitchen. aspirin 81 MG tablet Take 81 mg by mouth daily.    . brimonidine (ALPHAGAN) 0.2 % ophthalmic solution Place 1 drop into both eyes 2 (two) times daily.    . calcium-vitamin D (CALCIUM 500+D) 500-200 MG-UNIT per tablet Take 1 tablet by mouth daily.    . Cholecalciferol (VITAMIN D-3) 1000 UNITS CAPS Take 1 capsule by mouth daily.    . cloNIDine (CATAPRES) 0.1 MG tablet Take 1 tablet by mouth 3  times a day 270 tablet 1  . fluticasone (FLOVENT HFA) 110 MCG/ACT inhaler Inhale 2 puffs into the lungs 2 (two) times daily. Please instruct pt on how to use. 1 Inhaler 1  . glucosamine-chondroitin 500-400 MG tablet Take 1 tablet by mouth daily.    Marland Kitchen. glucose blood test strip accuchek compact test strips Check blood sugar tid    . latanoprost (XALATAN) 0.005 % ophthalmic solution As  directed    . LevOCARNitine (L-CARNITINE) 250 MG CAPS Take 1 capsule by mouth daily.    Marland Kitchen. levothyroxine (SYNTHROID) 112 MCG tablet Take 1 tablet (112 mcg total) by mouth daily. 90 tablet 3  . Multiple Vitamin (MULTIVITAMIN) tablet Take 1 tablet by mouth daily.    Marland Kitchen. omega-3 acid ethyl esters (LOVAZA) 1 G capsule Take 2 g by mouth daily.    . pantoprazole (PROTONIX) 40 MG tablet Take 1 tablet by mouth  daily 90 tablet 1  . pravastatin (PRAVACHOL) 40 MG tablet Take 2 tablets by mouth  daily 180 tablet 1  . quinapril (ACCUPRIL) 40 MG tablet Take 1 tablet by mouth two  times daily 180 tablet 1  . sitaGLIPtin (JANUVIA) 100 MG tablet Take 100 mg by mouth daily.    Marland Kitchen. spironolactone-hydrochlorothiazide (ALDACTAZIDE) 25-25 MG per tablet Take 1 tablet by mouth  daily 90 tablet 1   No current facility-administered medications on file prior to visit.    Review of Systems Patient denies any headache, lightheadedness or dizziness.  No sinus or allergy symptoms.  No chest pain, tightness or palpitations.  No increased shortness of breath, cough or congestion.  Breathing stable.  No nausea or vomiting.  No abdominal pain or cramping.  No bowel  change, such as diarrhea, constipation, BRBPR or melana.  No urine change. Overall she feels she is doing better.   She plans to adjust her diet and start exercising more.  Sugars have improved.  Blood pressures doing well.      Objective:   Physical Exam  Filed Vitals:   07/28/14 1605  BP: 120/60  Pulse: 56  Temp: 98.1 F (36.7 C)   Blood pressure recheck:  91138-42[address]/1672  73 year old female in no acute distress.   HEENT:  Nares- clear.  Oropharynx - without lesions. NECK:  Supple.  Nontender.  No audible bruit.  HEART:  Appears to be regular. LUNGS:  No crackles or wheezing audible.  Respirations even and unlabored.  RADIAL PULSE:  Equal bilaterally.   ABDOMEN:  Soft, nontender.  Bowel sounds present and normal.  No audible abdominal bruit.    EXTREMITIES:   No increased edema present.  DP pulses palpable and equal bilaterally.      FEET:  No lesions.       Assessment & Plan:  Essential hypertension Blood pressure doing relatively well.  Same medication regimen. Follow.  Follow metabolic panel.    Gastroesophageal reflux disease without esophagitis Controlled on protonix.  Follow.    Type 2 diabetes mellitus without complication A1c just checked 07/21/14 - 7.2. Improved.  Hold on making any changes in her medication.  Follow.  Saw Dr Inez PilgrimBrasington last week.  Lab Results  Component Value Date   HGBA1C 7.1* 07/22/2014   Hypothyroidism, unspecified hypothyroidism type TSH just checked 07/21/14 - .499 (wnl).    Hypercholesterolemia Low cholesterol diet and exercise.  On pravastatin.  Liver panel wnl.  Cholesterol - total 190 and LDL 111.   Lab Results  Component Value Date   CHOL 190 07/22/2014   HDL 40 07/22/2014   LDLCALC 111 07/22/2014   TRIG 194* 07/22/2014   Severe obesity (BMI >= 40) Discussed importance of diet and exercise.    Glaucoma Followed by opthalmology.  Saw Dr Inez PilgrimBrasington last week.    Breast cancer screening - [facility] DIGITAL SCREENING BILATERAL; Future  MSK. Doing well s/p right hip surgery.  Seeing Dr Ernest PineHooten.   CARDIOVASCULAR.  Stable.  Asymptomatic.  Continue risk factor modification.    PULMONARY.  Breathing stable.  Follow.   GI.  Colonoscopy 9/11 with diverticulosis.  Upper symptoms controlled on Protonix.  IFOB positive.  Referred to GI.  Had f/u colonoscopy and EGD.  Colonoscopy - 11/14.   LEFT CAROTID BRUIT.  Had carotid ultrasound 6/12 that revealed no hemodynamically significant stenosis.  Continue risk factor modification and daily aspirin.    HEALTH MAINTENANCE.  Physical 04/25/14.  Colonoscopy as outlined.    Mammogram 05/06/13 - Birads I.  Bone density 05/07/08 normal.  Schedule f/u mammogram.  Overdue.  Wants 3 D mammogram.  Will let me know when agreeable for f/u bone density.

## 2014-07-28 NOTE — Progress Notes (Signed)
Pre visit review using our clinic review tool, if applicable. No additional management support is needed unless otherwise documented below in the visit note. 

## 2014-08-03 ENCOUNTER — Encounter: Payer: Self-pay | Admitting: Internal Medicine

## 2014-08-03 DIAGNOSIS — E66812 Obesity, class 2: Secondary | ICD-10-CM | POA: Insufficient documentation

## 2014-08-03 DIAGNOSIS — Z6839 Body mass index (BMI) 39.0-39.9, adult: Secondary | ICD-10-CM | POA: Insufficient documentation

## 2014-08-12 ENCOUNTER — Encounter: Payer: Self-pay | Admitting: Internal Medicine

## 2014-10-04 ENCOUNTER — Other Ambulatory Visit: Payer: Self-pay | Admitting: Internal Medicine

## 2014-11-22 LAB — BASIC METABOLIC PANEL
BUN: 21 mg/dL (ref 4–21)
CREATININE: 1.2 mg/dL — AB (ref 0.5–1.1)
GLUCOSE: 111 mg/dL
POTASSIUM: 5.2 mmol/L (ref 3.4–5.3)
SODIUM: 137 mmol/L (ref 137–147)

## 2014-11-22 LAB — HEPATIC FUNCTION PANEL
ALK PHOS: 52 U/L (ref 25–125)
ALT: 17 U/L (ref 7–35)
AST: 17 U/L (ref 13–35)
BILIRUBIN, TOTAL: 0.8 mg/dL
Bilirubin, Direct: 0.19 mg/dL (ref 0.01–0.4)

## 2014-11-22 LAB — LIPID PANEL
Cholesterol: 162 mg/dL (ref 0–200)
HDL: 38 mg/dL (ref 35–70)
LDL Cholesterol: 92 mg/dL
TRIGLYCERIDES: 162 mg/dL — AB (ref 40–160)

## 2014-11-22 LAB — HEMOGLOBIN A1C: Hgb A1c MFr Bld: 7 % — AB (ref 4.0–6.0)

## 2014-11-24 ENCOUNTER — Encounter: Payer: Self-pay | Admitting: Internal Medicine

## 2014-11-26 ENCOUNTER — Ambulatory Visit
Admit: 2014-11-26 | Disposition: A | Discharge status: Home / Self Care | Payer: Self-pay | Attending: Internal Medicine | Admitting: Internal Medicine

## 2014-11-26 LAB — HM MAMMOGRAPHY: HM MAMMO: NEGATIVE

## 2014-11-27 ENCOUNTER — Encounter: Payer: Self-pay | Admitting: Internal Medicine

## 2014-11-27 ENCOUNTER — Encounter: Payer: Self-pay | Admitting: *Deleted

## 2014-11-27 ENCOUNTER — Ambulatory Visit (INDEPENDENT_AMBULATORY_CARE_PROVIDER_SITE_OTHER): Payer: 59 | Admitting: Internal Medicine

## 2014-11-27 VITALS — BP 128/58 | HR 50 | Temp 98.1°F | Resp 14 | Ht 59.75 in | Wt 220.2 lb

## 2014-11-27 DIAGNOSIS — E039 Hypothyroidism, unspecified: Secondary | ICD-10-CM | POA: Diagnosis not present

## 2014-11-27 DIAGNOSIS — E119 Type 2 diabetes mellitus without complications: Secondary | ICD-10-CM | POA: Diagnosis not present

## 2014-11-27 DIAGNOSIS — E7800 Pure hypercholesterolemia, unspecified: Secondary | ICD-10-CM

## 2014-11-27 DIAGNOSIS — K219 Gastro-esophageal reflux disease without esophagitis: Secondary | ICD-10-CM | POA: Diagnosis not present

## 2014-11-27 DIAGNOSIS — I10 Essential (primary) hypertension: Secondary | ICD-10-CM | POA: Diagnosis not present

## 2014-11-27 DIAGNOSIS — N289 Disorder of kidney and ureter, unspecified: Secondary | ICD-10-CM

## 2014-11-27 MED ORDER — GLUCOSE BLOOD VI STRP: ORAL_STRIP | Status: DC

## 2014-11-27 NOTE — Progress Notes (Signed)
Pre visit review using our clinic review tool, if applicable. No additional management support is needed unless otherwise documented below in the visit note. 

## 2014-11-27 NOTE — Progress Notes (Signed)
Patient ID: Lorraine Foley, female   DOB: Jan 30, 1941, 74 y.o.   MRN: [id number]   Subjective:    Patient ID: Lorraine Foley, female    DOB: April 26, 1941, 74 y.o.   MRN: [id number]  HPI  Patient here for a scheduled follow up.  States she is doing relatively well.  Trying to watch her diet.  Sugars attached.  Doing better.  Trying to be more active.  A1c trending down.  No cardiac symptoms with increased activity or exertion.  Breathing stable.  No nausea or vomiting.  No bowel change.     Past Medical History  Diagnosis Date  . Hypertension   . Diabetes mellitus   . Hypercholesterolemia   . Glaucoma   . GERD (gastroesophageal reflux disease)   . Hypothyroidism     Outpatient Encounter Prescriptions as of 11/27/2014  Medication Sig  . amLODipine (NORVASC) 5 MG tablet Take 5 mg by mouth daily.  Marland Kitchen aspirin 81 MG tablet Take 81 mg by mouth daily.  . brimonidine (ALPHAGAN) 0.2 % ophthalmic solution Place 1 drop into both eyes 2 (two) times daily.  . calcium-vitamin D (CALCIUM 500+D) 500-200 MG-UNIT per tablet Take 1 tablet by mouth daily.  . Cholecalciferol (VITAMIN D-3) 1000 UNITS CAPS Take 1 capsule by mouth daily.  . cloNIDine (CATAPRES) 0.1 MG tablet Take 1 tablet by mouth 3  times a day  . glucosamine-chondroitin 500-400 MG tablet Take 1 tablet by mouth daily.  Marland Kitchen glucose blood test strip accuchek compact test strips Check blood sugar tid  . latanoprost (XALATAN) 0.005 % ophthalmic solution As directed  . LevOCARNitine (L-CARNITINE) 250 MG CAPS Take 1 capsule by mouth daily.  Marland Kitchen levothyroxine (SYNTHROID) 112 MCG tablet Take 1 tablet (112 mcg total) by mouth daily.  . Multiple Vitamin (MULTIVITAMIN) tablet Take 1 tablet by mouth daily.  Marland Kitchen omega-3 acid ethyl esters (LOVAZA) 1 G capsule Take 2 g by mouth daily.  . pantoprazole (PROTONIX) 40 MG tablet Take 1 tablet by mouth  daily  . pravastatin (PRAVACHOL) 40 MG tablet Take 2 tablets by mouth  daily  . quinapril (ACCUPRIL) 40 MG  tablet Take 1 tablet by mouth two  times daily  . sitaGLIPtin (JANUVIA) 100 MG tablet Take 100 mg by mouth daily.  Marland Kitchen spironolactone-hydrochlorothiazide (ALDACTAZIDE) 25-25 MG per tablet Take 1 tablet by mouth  daily  . glucose blood (ACCU-CHEK COMPACT STRIPS) test strip Check sugar daily Dx E11.9  . [DISCONTINUED] amLODipine (NORVASC) 10 MG tablet Take 1 tablet by mouth  daily  . [DISCONTINUED] fluticasone (FLOVENT HFA) 110 MCG/ACT inhaler Inhale 2 puffs into the lungs 2 (two) times daily. Please instruct pt on how to use.    Review of Systems  Constitutional: Negative for appetite change and unexpected weight change.  HENT: Negative for congestion and sinus pressure.   Respiratory: Negative for cough, chest tightness and shortness of breath.   Cardiovascular: Negative for chest pain, palpitations and leg swelling.  Gastrointestinal: Negative for nausea, vomiting, abdominal pain and diarrhea.  Genitourinary: Negative for dysuria and difficulty urinating.  Skin: Negative for color change and rash.  Neurological: Negative for dizziness, light-headedness and headaches.  Psychiatric/Behavioral: Negative for dysphoric mood and agitation.       Objective:     Blood pressure recheck:  138/68  Physical Exam  Constitutional: She appears well-developed and well-nourished. No distress.  HENT:  Nose: Nose normal.  Mouth/Throat: Oropharynx is clear and moist.  Neck: Neck supple. No thyromegaly present.  Cardiovascular: Normal  rate and regular rhythm.   Pulmonary/Chest: Breath sounds normal. No respiratory distress. She has no wheezes.  Abdominal: Soft. Bowel sounds are normal. There is no tenderness.  Musculoskeletal: She exhibits no edema or tenderness.  Lymphadenopathy:    She has no cervical adenopathy.  Skin: No rash noted. No erythema.    BP 128/58 mmHg  Pulse 50  Temp(Src) 98.1 F (36.7 C) (Oral)  Resp 14  Ht 4' 11.75" (1.518 m)  Wt 220 lb 4 oz (99.905 kg)  BMI 43.36 kg/m2   SpO2 97%  LMP 08/11/1995 Wt Readings from Last 3 Encounters:  11/27/14 220 lb 4 oz (99.905 kg)  07/28/14 219 lb (99.338 kg)  04/25/14 219 lb 8 oz (99.565 kg)     Lab Results  Component Value Date   WBC 5.6 07/22/2014   HGB 14.7 07/22/2014   HCT 44 07/22/2014   PLT 251 07/22/2014   GLUCOSE 121* 03/05/2014   CHOL 162 11/22/2014   TRIG 162* 11/22/2014   HDL 38 11/22/2014   LDLCALC 92 11/22/2014   ALT 17 11/22/2014   AST 17 11/22/2014   NA 137 11/22/2014   K 5.2 11/22/2014   CL 98 03/05/2014   CREATININE 1.2* 11/22/2014   BUN 21 11/22/2014   CO2 29 03/05/2014   TSH 0.50 07/22/2014   HGBA1C 7.0* 11/22/2014       Assessment & Plan:   Problem List Items Addressed This Visit    Diabetes mellitus    A1c improved.  7.0 on recent check.  Has been trending down.  Same medication regimen.  Sugars attached.  Continue diet adjustment and exercise.  Follow.       GERD (gastroesophageal reflux disease)    EGD 07/08/13 - normal.  On protonix.  Symptoms controlled.        Hypercholesterolemia    Low cholesterol diet and exercise.  On pravastatin.  LDL just checked 92.  Follow.       Hypertension - Primary    Blood pressure doing well.  See her attached list.  Follow pressures.  Same medication regimen.  Follow metabolic panel.       Hypothyroidism    On thyroid replacement.  Follow tsh.       Renal insufficiency    Cr just checked 1.22.  Slightly increased.  Stay hydrated.  Recheck kidney function tests in 3-4 weeks.        Severe obesity (BMI >= 40)    Diet and exercise.  Follow.            Dale DurhamSCOTT, Airam Heidecker, MD

## 2014-11-30 ENCOUNTER — Encounter: Payer: Self-pay | Admitting: Internal Medicine

## 2014-11-30 DIAGNOSIS — N289 Disorder of kidney and ureter, unspecified: Secondary | ICD-10-CM | POA: Insufficient documentation

## 2014-11-30 NOTE — Assessment & Plan Note (Signed)
Low cholesterol diet and exercise.  On pravastatin.  LDL just checked 92.  Follow.

## 2014-11-30 NOTE — Assessment & Plan Note (Signed)
Blood pressure doing well.  See her attached list.  Follow pressures.  Same medication regimen.  Follow metabolic panel.

## 2014-11-30 NOTE — Assessment & Plan Note (Signed)
A1c improved.  7.0 on recent check.  Has been trending down.  Same medication regimen.  Sugars attached.  Continue diet adjustment and exercise.  Follow.

## 2014-11-30 NOTE — Assessment & Plan Note (Signed)
On thyroid replacement.  Follow tsh.  

## 2014-11-30 NOTE — Assessment & Plan Note (Signed)
Diet and exercise.  Follow.  

## 2014-11-30 NOTE — Assessment & Plan Note (Signed)
Cr just checked 1.22.  Slightly increased.  Stay hydrated.  Recheck kidney function tests in 3-4 weeks.

## 2014-11-30 NOTE — Assessment & Plan Note (Signed)
EGD 07/08/13 - normal.  On protonix.  Symptoms controlled.   

## 2014-12-16 LAB — BASIC METABOLIC PANEL
BUN: 22 mg/dL — AB (ref 4–21)
CREATININE: 1.2 mg/dL — AB (ref 0.5–1.1)
Glucose: 112 mg/dL
Potassium: 4.8 mmol/L (ref 3.4–5.3)
SODIUM: 141 mmol/L (ref 137–147)

## 2014-12-18 ENCOUNTER — Encounter: Payer: Self-pay | Admitting: Internal Medicine

## 2014-12-18 ENCOUNTER — Encounter: Payer: Self-pay | Admitting: *Deleted

## 2014-12-18 ENCOUNTER — Telehealth: Payer: Self-pay | Admitting: Internal Medicine

## 2014-12-18 NOTE — Telephone Encounter (Signed)
Letter mailed

## 2014-12-18 NOTE — Telephone Encounter (Signed)
Notify pt that kidney function has improved.  Ok.

## 2014-12-21 NOTE — Op Note (Signed)
PATIENT NAME:  Lorraine Foley, Lorraine Foley MR#:  [id number] DATE OF BIRTH:  [birth year]  DATE OF PROCEDURE:  01/02/2012  PREOPERATIVE DIAGNOSIS: Degenerative arthrosis of the right hip.   POSTOPERATIVE DIAGNOSIS: Degenerative arthrosis of the right hip.   PROCEDURE PERFORMED: Right total hip arthroplasty.   SURGEON: Illene Labrador. Hooten, M.D.   ASSISTANT: Van Clines, PA-C (required to maintain retraction throughout the procedure)   ANESTHESIA: Spinal and general.   ESTIMATED BLOOD LOSS: 400 mL.   FLUIDS REPLACED: 1900 mL of crystalloid.   DRAINS: Two medium drains to Hemovac reservoir.   IMPLANTS UTILIZED: DePuy 13.5-mm small stature Prodigy femoral component, 52-mm outer diameter Pinnacle acetabular component, 36-mm inner diameter Pinnacle Marathon +4 neutral polyethylene liner, and a 36-mm m-spec femoral head with a -2-mm neck length.   INDICATIONS FOR SURGERY: The patient is a 74 year old female who has been seen for complaints of progressive right hip and groin pain as well as decreased hip range of motion. X-rays demonstrated severe degenerative changes. The patient did not see any significant improvement despite conservative nonsurgical intervention. After discussion of the risks and benefits of surgical intervention, the patient expressed her understanding of the risks and benefits and agreed with plans for surgical intervention.   PROCEDURE IN DETAIL: The patient was brought to the operating room and, after adequate spinal and subsequently general anesthesia was achieved, the patient was placed in a left lateral decubitus position. Axillary roll was placed and all bony prominences were well padded. The patient's right hip and leg were cleaned and prepped with alcohol and DuraPrep draped in the usual sterile fashion. A "time-out" was performed as per usual protocol. A lateral curvilinear incision was made gently curving towards the posterior superior iliac spine. The IT band was incised in line with the  skin incision and the fibers of the gluteus maximus were split in line. Gluteal sling was incised so as to better mobilize the proximal femur. The piriformis tendon was identified, skeletonized, and incised at its insertion to the proximal femur and reflected posteriorly. In a similar fashion, the short external rotators were incised and reflected posteriorly. A T-type posterior capsulotomy was performed. Prior to dislocation of the femoral head, a threaded Steinmann pin was inserted through a separate stab incision into the pelvis superior to the acetabulum and bent in the form of a stylus so as to assess limb length and hip offset throughout the procedure. The femoral head was then dislocated posteriorly. Severe degenerative changes were noted to the femoral head. The femoral neck cut was performed using an oscillating saw. The anterior capsule was elevated off of the femoral neck. Inspection of the acetabulum also demonstrated severe degenerative changes. The remnant of the labrum was excised. The acetabulum was reamed in a sequential fashion up to a 51-mm diameter. This allowed for excellent punctate bleeding bone. A 52-mm Pinnacle 100 acetabular component was positioned and impacted into place. Good scratch fit was appreciated. A+4 neutral polyethylene trial was inserted and attention was directed to the proximal femur. A pilot hole for reaming of the proximal femoral canal was created using a high-speed bur. Proximal femoral canal was then reamed in a sequential fashion up to a 13-mm diameter. This allowed for approximately 6-cm scratch fit. Proximal portion of the femur was then prepared using a 13.5-mm aggressive side-biting reamer. Serial broaches were inserted up to a 13.5-mm small stature broach. The calcar region was planed and trial reduction was performed using AML neck segment. Given the hip offset noted with the  AML neck segment, trial was subsequently performed with a Prodigy neck segment with a  -2-mm neck length. This allowed for good equalization of limb lengths and excellent stability. Trial components were removed. The acetabular shell was irrigated with copious amounts of normal saline with antibiotic solution and then suctioned dry. A +4-mm neutral Pinnacle Marathon polyethylene liner was positioned and impacted into place. Next, a 13.5-mm small stature Prodigy femoral component was positioned and impacted into place. Excellent scratch fit was appreciated. The Morse taper was cleaned and dried and a 36-mm m-spec femoral head with a -2-mm neck length was placed on the trunnion and impacted into place. The hip was then reduced and placed through a range of motion. Good equalization of limb lengths and restoration of hip offset was appreciated. Excellent stability was appreciated both anteriorly and posteriorly.   The wound was irrigated with copious amounts of normal saline with antibiotic solution using pulsatile lavage and then suctioned dry. Good hemostasis was appreciated. The posterior capsulotomy was repaired using #5 Ethibond. The piriformis tendon was reapproximated on the undersurface of the gluteus medius tendon using #5 Ethibond. The gluteal sling was also repaired using interrupted sutures of #5 Ethibond. Two medium drains were placed in the wound bed and brought out through a separate stab incision to be attached to a Hemovac reservoir. The IT band was repaired using interrupted sutures of #1 Vicryl. The subcutaneous tissue was approximated in layers using first #0 Vicryl followed by #2-0 Vicryl.  The skin was closed with skin staples. Sterile dressing was applied.   The patient tolerated the procedure well. She was transported to the recovery room in stable condition.    ____________________________ Illene LabradorJames P. Angie FavaHooten Jr., MD jph:bjt D: 01/03/2012 01:44:39 ET T: 01/03/2012 10:45:33 ET JOB#: [id number]  cc: Fayrene FearingJames P. Angie FavaHooten Jr., MD, <Dictator> JAMES P Angie FavaHOOTEN JR MD ELECTRONICALLY  SIGNED 01/14/2012 21:19

## 2014-12-21 NOTE — Discharge Summary (Signed)
PATIENT NAME:  Lorraine Foley, Lorraine Foley MR#:  [id number] DATE OF BIRTH:  Oct 22, 1940  DATE OF ADMISSION:  01/02/2012 DATE OF DISCHARGE:  01/05/2012  ADMITTING DIAGNOSIS: Degenerative arthrosis of right hip.   DISCHARGE DIAGNOSES: Degenerative arthrosis of right hip.   HISTORY: Patient is a 74 year old who [facility] of right hip pain. The patient has had a history of lower back pain and had been treated by a local chiropractor. However, she had continued to have intermittent right hip and groin pain as well as buttock discomfort. She had noticed progressive decrease in her range of motion. At the time of surgery she was using a quad cane for ambulation due to the severity of pain. The pain tended to be worse with weight-bearing activities. She had denied any gross numbness, paresthesias or causalgia of the lower extremity. Her right hip pain had progressed to the point that it was significantly interfering with her activities of daily living. X-rays taken in the clinic showed significant loss of the joint space with bone-on-bone articulation noted. She was noted to have subchondral sclerosis as well as osteophyte formation. After discussion of the risks and benefits of surgical intervention the patient expressed her understanding of the risks and benefits and agreed for plans for surgical intervention.   PROCEDURE: Right total hip arthroplasty.   ANESTHESIA: Spinal with general.   IMPLANTS UTILIZED: DePuy 13.5 mm small stature Prodigy femoral component, 52 mm outer diameter Pinnacle acetabular component, 36 mm inner diameter Pinnacle Marathon +4 neutral polyethylene liner and a 36 mm M-Spec femoral head with a -2 mm neck length.   HOSPITAL COURSE: Patient tolerated the procedure very well. She had no complications. She was then taken to PAC-U where she was stabilized then transferred to the orthopedic floor. She began receiving anticoagulation therapy of Lovenox 30 mg  subcutaneous every 12 hours per anesthesia protocol. She was fitted with TED stockings bilaterally. These were allowed to be removed one hour per eight-hour shift. Patient was also fitted with the AV-I compression foot pumps set at 80 mmHg. She has had no discomfort to the calves. No evidence of any deep venous thromboses. Negative Homans sign. Patient's heels were elevated off the bed using rolled towels. She voiced no complaints.   Patient's vital signs have been stable. She has been afebrile. Hemodynamically she was stable. No transfusions were given.   Physical therapy was initiated on day one for gait training and transfers. The first day, however, this was extremely slow. She felt that she had a reaction to the morphine which caused her to be extremely lightheaded. However, she did progress very well on day two and upon being discharged was ambulating greater than 200 feet. Was independent with bed to chair transfers. Was able go up and down four sets of steps. Occupational therapy was also initiated on day one for activities of daily living and assistive devices.   Patient's IV, Foley and Hemovac were all discontinued on day two along with the dressing change. The wound has been free of any signs of infection. She was noted to have some serous drainage but otherwise was unremarkable.   DISPOSITION: Patient is being discharged to home in improved stable condition.   DISCHARGE INSTRUCTIONS:  1. She may weight bear as tolerated.  2. She has gone over the hip precautions.  3. She is to continue wearing the TED stockings. These are to be worn during the day but may be removed at night.  4.  Staples will be removed in two weeks followed by the application of benzoin and Steri-Strips.  5. She is placed on an ADI diet.  6. She is to continue using a walker until cleared by physical therapy to go to a quad cane. She is to use a pillow between her legs when in bed.  7. She will be seen in the office on  06/18 at 1:30. She is to call the clinic sooner if any temperatures of 101.5 or greater or excessive bleeding.  8. She was given a prescription for Ultram 1 to 2 tablets every 4 to 6 hours p.r.n. for pain, oxycodone 5 to 10 mg every 4 to 6 hours p.r.n. for pain, Lovenox 40 mg subcutaneously daily for 14 days, then discontinue and begin taking one 81 mg enteric-coated aspirin per day.   PAST MEDICAL HISTORY:  1. Hypertension.  2. Diabetes.  3. Glaucoma.  4. Hypercholesterolemia.   ____________________________ Van Clines, PA jrw:cms D: 01/05/2012 07:13:47 ET T: 01/05/2012 14:40:52 ET JOB#: [id number]  cc: Van Clines, PA, <Dictator>  Anida Deol PA ELECTRONICALLY SIGNED 01/05/2012 21:12

## 2014-12-30 ENCOUNTER — Encounter: Payer: Self-pay | Admitting: Internal Medicine

## 2015-01-29 ENCOUNTER — Other Ambulatory Visit: Payer: Self-pay | Admitting: *Deleted

## 2015-01-29 MED ORDER — AMLODIPINE BESYLATE 5 MG PO TABS: 5.0000 mg | ORAL_TABLET | Freq: Every day | ORAL | Status: DC

## 2015-02-20 LAB — HM DIABETES EYE EXAM

## 2015-02-22 DIAGNOSIS — Z96641 Presence of right artificial hip joint: Secondary | ICD-10-CM | POA: Insufficient documentation

## 2015-02-23 ENCOUNTER — Encounter: Payer: Self-pay | Admitting: *Deleted

## 2015-03-05 ENCOUNTER — Ambulatory Visit (INDEPENDENT_AMBULATORY_CARE_PROVIDER_SITE_OTHER): Payer: 59 | Admitting: Internal Medicine

## 2015-03-05 ENCOUNTER — Encounter: Payer: Self-pay | Admitting: Internal Medicine

## 2015-03-05 VITALS — BP 120/73 | HR 48 | Temp 98.0°F | Ht 59.75 in | Wt 223.0 lb

## 2015-03-05 DIAGNOSIS — K219 Gastro-esophageal reflux disease without esophagitis: Secondary | ICD-10-CM | POA: Diagnosis not present

## 2015-03-05 DIAGNOSIS — E7800 Pure hypercholesterolemia, unspecified: Secondary | ICD-10-CM

## 2015-03-05 DIAGNOSIS — N289 Disorder of kidney and ureter, unspecified: Secondary | ICD-10-CM

## 2015-03-05 DIAGNOSIS — I10 Essential (primary) hypertension: Secondary | ICD-10-CM

## 2015-03-05 DIAGNOSIS — E039 Hypothyroidism, unspecified: Secondary | ICD-10-CM | POA: Diagnosis not present

## 2015-03-05 DIAGNOSIS — R001 Bradycardia, unspecified: Secondary | ICD-10-CM

## 2015-03-05 DIAGNOSIS — H409 Unspecified glaucoma: Secondary | ICD-10-CM

## 2015-03-05 DIAGNOSIS — E119 Type 2 diabetes mellitus without complications: Secondary | ICD-10-CM | POA: Diagnosis not present

## 2015-03-05 DIAGNOSIS — Z0000 Encounter for general adult medical examination without abnormal findings: Secondary | ICD-10-CM

## 2015-03-05 NOTE — Progress Notes (Signed)
Patient ID: Lorraine Foley, female   DOB: 06-20-1941, 74 y.o.   MRN: [id number]   Subjective:    Patient ID: Lorraine Foley, female    DOB: 10-02-1940, 74 y.o.   MRN: [id number]  HPI  Patient here for a scheduled follow up. She is monitoring her sugars.  States am sugars averaging 130-140.  Blood pressures averaging 135-145 systolic readings.  Tries to stay active.  Discussed diet and exercise.  No cardiac symptoms with increased activity or exertion.  No sob.  Bowels stable.     Past Medical History  Diagnosis Date  . Hypertension   . Diabetes mellitus   . Hypercholesterolemia   . Glaucoma   . GERD (gastroesophageal reflux disease)   . Hypothyroidism     Current Outpatient Prescriptions on File Prior to Visit  Medication Sig Dispense Refill  . amLODipine (NORVASC) 5 MG tablet Take 1 tablet (5 mg total) by mouth daily. 90 tablet 1  . aspirin 81 MG tablet Take 81 mg by mouth daily.    . brimonidine (ALPHAGAN) 0.2 % ophthalmic solution Place 1 drop into both eyes 2 (two) times daily.    . calcium-vitamin D (CALCIUM 500+D) 500-200 MG-UNIT per tablet Take 1 tablet by mouth daily.    . Cholecalciferol (VITAMIN D-3) 1000 UNITS CAPS Take 1 capsule by mouth daily.    . cloNIDine (CATAPRES) 0.1 MG tablet Take 1 tablet by mouth 3  times a day 270 tablet 1  . glucosamine-chondroitin 500-400 MG tablet Take 1 tablet by mouth daily.    Marland Kitchen glucose blood (ACCU-CHEK COMPACT STRIPS) test strip Check sugar daily Dx E11.9 100 each 3  . glucose blood test strip accuchek compact test strips Check blood sugar tid    . latanoprost (XALATAN) 0.005 % ophthalmic solution As directed    . LevOCARNitine (L-CARNITINE) 250 MG CAPS Take 1 capsule by mouth daily.    Marland Kitchen levothyroxine (SYNTHROID) 112 MCG tablet Take 1 tablet (112 mcg total) by mouth daily. 90 tablet 3  . Multiple Vitamin (MULTIVITAMIN) tablet Take 1 tablet by mouth daily.    Marland Kitchen omega-3 acid ethyl esters (LOVAZA) 1 G capsule Take 2 g by mouth  daily.    . pantoprazole (PROTONIX) 40 MG tablet Take 1 tablet by mouth  daily 90 tablet 1  . pravastatin (PRAVACHOL) 40 MG tablet Take 2 tablets by mouth  daily 180 tablet 1  . quinapril (ACCUPRIL) 40 MG tablet Take 1 tablet by mouth two  times daily 180 tablet 1  . sitaGLIPtin (JANUVIA) 100 MG tablet Take 100 mg by mouth daily.    Marland Kitchen spironolactone-hydrochlorothiazide (ALDACTAZIDE) 25-25 MG per tablet Take 1 tablet by mouth  daily 90 tablet 1   No current facility-administered medications on file prior to visit.    Review of Systems  Constitutional: Negative for appetite change and unexpected weight change.  HENT: Negative for congestion and sinus pressure.   Respiratory: Negative for cough, chest tightness and shortness of breath.   Cardiovascular: Negative for chest pain, palpitations and leg swelling.  Gastrointestinal: Negative for nausea, abdominal pain and diarrhea.  Neurological: Negative for dizziness, light-headedness and headaches.  Psychiatric/Behavioral: Negative for dysphoric mood and agitation.       Objective:     Blood pressure recheck:  138/62, pulse 56  Physical Exam  Constitutional: She appears well-developed and well-nourished. No distress.  HENT:  Nose: Nose normal.  Mouth/Throat: Oropharynx is clear and moist.  Neck: Neck supple. No thyromegaly present.  Cardiovascular:  Normal rate and regular rhythm.   Pulmonary/Chest: Breath sounds normal. No respiratory distress. She has no wheezes.  Abdominal: Soft. Bowel sounds are normal. There is no tenderness.  Musculoskeletal: She exhibits no edema or tenderness.  Lymphadenopathy:    She has no cervical adenopathy.  Skin: No rash noted. No erythema.  Psychiatric: She has a normal mood and affect. Her behavior is normal.    BP 120/73 mmHg  Pulse 48  Temp(Src) 98 F (36.7 C) (Oral)  Ht 4' 11.75" (1.518 m)  Wt 223 lb (101.152 kg)  BMI 43.90 kg/m2  SpO2 96%  LMP 08/11/1995 Wt Readings from Last 3  Encounters:  03/05/15 223 lb (101.152 kg)  11/27/14 220 lb 4 oz (99.905 kg)  07/28/14 219 lb (99.338 kg)     Lab Results  Component Value Date   WBC 5.6 07/22/2014   HGB 14.7 07/22/2014   HCT 44 07/22/2014   PLT 251 07/22/2014   GLUCOSE 121* 03/05/2014   CHOL 162 11/22/2014   TRIG 162* 11/22/2014   HDL 38 11/22/2014   LDLCALC 92 11/22/2014   ALT 17 11/22/2014   AST 17 11/22/2014   NA 141 12/16/2014   K 4.8 12/16/2014   CL 98 03/05/2014   CREATININE 1.2* 12/16/2014   BUN 22* 12/16/2014   CO2 29 03/05/2014   TSH 0.50 07/22/2014   INR 0.9 12/19/2011   HGBA1C 7.0* 11/22/2014       Assessment & Plan:   Problem List Items Addressed This Visit    Bradycardia    Pulse as outlined.  Recheck 56.  Follow.        Diabetes mellitus    A1c improved.  Sugars as outlined.  Has been trending down.  Check metabolic panel.  Continue diet adjustment and exercise.        GERD (gastroesophageal reflux disease)    EGD 07/08/13 - normal.  On protonix.  Symptoms controlled.        Glaucoma    Followed by opthalmology.       Health care maintenance    Mammogram 11/26/14 - Birads I.  Colonoscopy 06/2013.  Physical 04/25/14.        Hypercholesterolemia    Low cholesterol diet and exercise.  On pravastatin.  Follow lipid panel and liver function tests.        Hypertension - Primary    Blood pressure doing well.  Follow pressures.  Follow metabolic panel.        Hypothyroidism    On thyroid replacement.  Follow tsh.        Renal insufficiency    Cr 1.2 stay hydrated.  Follow.        Severe obesity (BMI >= 40)    Diet and exercise.            Dale DurhamSCOTT, Jeb Schloemer, MD

## 2015-03-05 NOTE — Progress Notes (Signed)
Pre visit review using our clinic review tool, if applicable. No additional management support is needed unless otherwise documented below in the visit note. 

## 2015-03-08 ENCOUNTER — Encounter: Payer: Self-pay | Admitting: Internal Medicine

## 2015-03-08 NOTE — Assessment & Plan Note (Signed)
EGD 07/08/13 - normal.  On protonix.  Symptoms controlled.

## 2015-03-08 NOTE — Assessment & Plan Note (Signed)
Followed by opthalmology.       

## 2015-03-08 NOTE — Assessment & Plan Note (Signed)
Low cholesterol diet and exercise.  On pravastatin.  Follow lipid panel and liver function tests.   

## 2015-03-08 NOTE — Assessment & Plan Note (Signed)
Cr 1.2 stay hydrated.  Follow.

## 2015-03-08 NOTE — Assessment & Plan Note (Signed)
Diet and exercise.   

## 2015-03-08 NOTE — Assessment & Plan Note (Signed)
Mammogram 11/26/14 - Birads I.  Colonoscopy 06/2013.  Physical 04/25/14.

## 2015-03-08 NOTE — Assessment & Plan Note (Signed)
On thyroid replacement.  Follow tsh.  

## 2015-03-08 NOTE — Assessment & Plan Note (Signed)
Blood pressure doing well.  Follow pressures.  Follow metabolic panel.  

## 2015-03-08 NOTE — Assessment & Plan Note (Signed)
Pulse as outlined.  Recheck 56.  Follow.

## 2015-03-08 NOTE — Assessment & Plan Note (Signed)
A1c improved.  Sugars as outlined.  Has been trending down.  Check metabolic panel.  Continue diet adjustment and exercise.

## 2015-03-25 ENCOUNTER — Encounter: Payer: Self-pay | Admitting: Internal Medicine

## 2015-03-25 LAB — LIPID PANEL
Cholesterol: 187 mg/dL (ref 0–200)
HDL: 41 mg/dL (ref 35–70)
LDL CALC: 110 mg/dL
Triglycerides: 182 mg/dL — AB (ref 40–160)

## 2015-03-25 LAB — BASIC METABOLIC PANEL
BUN: 23 mg/dL — AB (ref 4–21)
CREATININE: 1.1 mg/dL (ref 0.5–1.1)
GLUCOSE: 158 mg/dL
Potassium: 5.2 mmol/L (ref 3.4–5.3)
Sodium: 140 mmol/L (ref 137–147)

## 2015-03-25 LAB — TSH: TSH: 0.37 u[IU]/mL — AB (ref 0.41–5.90)

## 2015-03-25 LAB — HEPATIC FUNCTION PANEL
ALK PHOS: 60 U/L (ref 25–125)
ALT: 18 U/L (ref 7–35)
AST: 17 U/L (ref 13–35)
BILIRUBIN DIRECT: 0.18 mg/dL (ref 0.01–0.4)
BILIRUBIN, TOTAL: 0.8 mg/dL

## 2015-03-25 LAB — HEMOGLOBIN A1C: Hgb A1c MFr Bld: 7.5 % — AB (ref 4.0–6.0)

## 2015-03-26 ENCOUNTER — Telehealth: Payer: Self-pay | Admitting: Internal Medicine

## 2015-03-26 NOTE — Telephone Encounter (Signed)
Notify pt that her kidney function is stable (slightly improved).  Her cholesterol increased some from the last check.  Low cholesterol diet and exercise.  We will follow.  Her overall sugar control increased some as well.  Low carb diet and exercise.  Follow sugars.  Liver function tests wnl.  TSH is suppressed.  Confirm she is on synthroid q day.  If so, then decrease to q day.  Recheck tsh in 6 weeks.

## 2015-03-27 ENCOUNTER — Other Ambulatory Visit: Payer: Self-pay | Admitting: *Deleted

## 2015-03-27 MED ORDER — LEVOTHYROXINE SODIUM 100 MCG PO TABS: 100.0000 ug | ORAL_TABLET | Freq: Every day | ORAL | Status: DC

## 2015-03-27 NOTE — Telephone Encounter (Signed)
Spoke with pt, advised of lab results.  Pt verbalized understanding.  New Synthroid Rx sent to [facility] pharmacy.

## 2015-04-14 ENCOUNTER — Telehealth: Payer: Self-pay | Admitting: Internal Medicine

## 2015-04-14 NOTE — Telephone Encounter (Signed)
Left message on Vm to return call 

## 2015-04-14 NOTE — Telephone Encounter (Signed)
Pt called to get lab results which was done about 3 weeks ago at [facility] in [place]. Thank You!

## 2015-04-14 NOTE — Telephone Encounter (Signed)
Per 03/26/15 phone message - pt was notified.  See phone note.  Need to clarify she got message.  Thanks

## 2015-04-15 ENCOUNTER — Telehealth: Payer: Self-pay | Admitting: *Deleted

## 2015-04-15 NOTE — Telephone Encounter (Signed)
Labs mailed as requested.

## 2015-04-15 NOTE — Telephone Encounter (Signed)
Patient requested that her recent lab results be sent to her via-mail.-Thanks

## 2015-04-20 ENCOUNTER — Telehealth: Payer: Self-pay | Admitting: Internal Medicine

## 2015-04-20 NOTE — Telephone Encounter (Signed)
Pt stated that she needed a copy of her lab work from [facility] on original paper that it was sent. Please mail.

## 2015-04-23 NOTE — Telephone Encounter (Signed)
Copy requested from [facility] & mailed to patient

## 2015-04-24 ENCOUNTER — Telehealth: Payer: Self-pay | Admitting: *Deleted

## 2015-04-24 NOTE — Telephone Encounter (Signed)
Patient has requested a copy of her lab results for a previous appt. In July. She also asked if she could have this done by 04/27/15, being this is her third time requesting results. Please call phone number [phone] on Monday up until 3pm. -Thanks

## 2015-04-24 NOTE — Telephone Encounter (Signed)
Pt labs were mailed to her (as requested) the first two times. I'm not understanding the issue. It takes more than a day or two to be received by mailed (see prior phone note). I have also left this on patients voicemail this afternoon. If she calls back I need to speak with her or this needs to be explained. Thanks

## 2015-06-11 ENCOUNTER — Ambulatory Visit (INDEPENDENT_AMBULATORY_CARE_PROVIDER_SITE_OTHER): Payer: 59 | Admitting: Internal Medicine

## 2015-06-11 ENCOUNTER — Encounter: Payer: Self-pay | Admitting: Internal Medicine

## 2015-06-11 VITALS — BP 126/60 | HR 54 | Temp 98.2°F | Resp 18 | Ht 59.5 in | Wt 220.2 lb

## 2015-06-11 DIAGNOSIS — Z0000 Encounter for general adult medical examination without abnormal findings: Secondary | ICD-10-CM | POA: Diagnosis not present

## 2015-06-11 DIAGNOSIS — K219 Gastro-esophageal reflux disease without esophagitis: Secondary | ICD-10-CM

## 2015-06-11 DIAGNOSIS — N289 Disorder of kidney and ureter, unspecified: Secondary | ICD-10-CM

## 2015-06-11 DIAGNOSIS — E119 Type 2 diabetes mellitus without complications: Secondary | ICD-10-CM

## 2015-06-11 DIAGNOSIS — E7800 Pure hypercholesterolemia, unspecified: Secondary | ICD-10-CM

## 2015-06-11 DIAGNOSIS — I10 Essential (primary) hypertension: Secondary | ICD-10-CM | POA: Diagnosis not present

## 2015-06-11 DIAGNOSIS — E039 Hypothyroidism, unspecified: Secondary | ICD-10-CM

## 2015-06-11 MED ORDER — PANTOPRAZOLE SODIUM 40 MG PO TBEC: DELAYED_RELEASE_TABLET | ORAL | Status: DC

## 2015-06-11 MED ORDER — AMLODIPINE BESYLATE 5 MG PO TABS: 5.0000 mg | ORAL_TABLET | Freq: Every day | ORAL | Status: DC

## 2015-06-11 MED ORDER — QUINAPRIL HCL 40 MG PO TABS: ORAL_TABLET | ORAL | Status: DC

## 2015-06-11 MED ORDER — SPIRONOLACTONE-HCTZ 25-25 MG PO TABS: ORAL_TABLET | ORAL | Status: DC

## 2015-06-11 MED ORDER — PRAVASTATIN SODIUM 40 MG PO TABS: ORAL_TABLET | ORAL | Status: DC

## 2015-06-11 MED ORDER — CLONIDINE HCL 0.1 MG PO TABS: ORAL_TABLET | ORAL | Status: DC

## 2015-06-11 NOTE — Progress Notes (Signed)
Patient ID: Lorraine Foley, female   DOB: [birth year], 74 y.o.   MRN: [id number]   Subjective:    Patient ID: Lorraine Foley, female    DOB: October 24, 1940, 74 y.o.   MRN: [id number]  HPI  Patient with past history of hypertension, diabetes, hypothyroidism and hypercholesterolemia.  She comes in today to follow up on these issues as well as for a complete physical exam.  She had labs through her work.  a1c improved - 7.1.  Discussed diet and exercise.  No chest pain or tightness.  No sob.  No acid reflux.  No abdominal pain or cramping.  Bowels stable.  Plans to get more active.     Past Medical History  Diagnosis Date  . Hypertension   . Diabetes mellitus ([place])   . Hypercholesterolemia   . Glaucoma   . GERD (gastroesophageal reflux disease)   . Hypothyroidism    Past Surgical History  Procedure Laterality Date  . 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 Eye surgery Bilateral 08/2014, 2.2016    laser surgery in preparation for glaucoma surgery   Family History  Problem Relation Age of Onset  . Mental illness Father     suicide  . Liver disease Father     alcohol  . Glaucoma Father   . Alcohol abuse Father   . Glaucoma Mother   . Osteoporosis Mother   . Diabetes Sister   . Breast cancer Sister   . Colon cancer Neg Hx   . Blindness Father    Social History   Social History  . Marital Status: Divorced    Spouse Name: N/A  . Number of Children: 1  . Years of Education: N/A   Social History Main Topics  . Smoking status: Never Smoker   . Smokeless tobacco: Never Used  . Alcohol Use: No  . Drug Use: No  . Sexual Activity: Not Asked   Other Topics Concern  . None   Social History Narrative    Outpatient Encounter Prescriptions as of 06/11/2015  Medication Sig  . amLODipine (NORVASC) 5 MG tablet Take 1 tablet (5 mg total) by mouth daily.  Marland Kitchen aspirin 81 MG tablet Take 81 mg by mouth daily.  . brimonidine (ALPHAGAN) 0.2 % ophthalmic solution Place 1 drop into  both eyes 2 (two) times daily.  . calcium-vitamin D (CALCIUM 500+D) 500-200 MG-UNIT per tablet Take 1 tablet by mouth daily.  . Cholecalciferol (VITAMIN D-3) 1000 UNITS CAPS Take 1 capsule by mouth daily.  . cloNIDine (CATAPRES) 0.1 MG tablet Take 1 tablet by mouth 3  times a day  . glucosamine-chondroitin 500-400 MG tablet Take 1 tablet by mouth daily.  Marland Kitchen glucose blood (ACCU-CHEK COMPACT STRIPS) test strip Check sugar daily Dx E11.9  . glucose blood test strip accuchek compact test strips Check blood sugar tid  . latanoprost (XALATAN) 0.005 % ophthalmic solution As directed  . LevOCARNitine (L-CARNITINE) 250 MG CAPS Take 1 capsule by mouth daily.  Marland Kitchen levothyroxine (SYNTHROID) 112 MCG tablet Take 1 tablet (112 mcg total) by mouth daily.  Marland Kitchen levothyroxine (SYNTHROID, LEVOTHROID) 100 MCG tablet Take 1 tablet (100 mcg total) by mouth daily.  . Multiple Vitamin (MULTIVITAMIN) tablet Take 1 tablet by mouth daily.  Marland Kitchen omega-3 acid ethyl esters (LOVAZA) 1 G capsule Take 2 g by mouth daily.  . pantoprazole (PROTONIX) 40 MG tablet Take 1 tablet by mouth  daily  . pravastatin (PRAVACHOL) 40 MG tablet Take 2 tablets by  mouth  daily  . quinapril (ACCUPRIL) 40 MG tablet Take 1 tablet by mouth two  times daily  . sitaGLIPtin (JANUVIA) 100 MG tablet Take 100 mg by mouth daily.  Marland Kitchen spironolactone-hydrochlorothiazide (ALDACTAZIDE) 25-25 MG tablet Take 1 tablet by mouth  daily  . timolol (TIMOPTIC) 0.5 % ophthalmic solution   . [DISCONTINUED] amLODipine (NORVASC) 5 MG tablet Take 1 tablet (5 mg total) by mouth daily.  . [DISCONTINUED] cloNIDine (CATAPRES) 0.1 MG tablet Take 1 tablet by mouth 3  times a day  . [DISCONTINUED] pantoprazole (PROTONIX) 40 MG tablet Take 1 tablet by mouth  daily  . [DISCONTINUED] pravastatin (PRAVACHOL) 40 MG tablet Take 2 tablets by mouth  daily  . [DISCONTINUED] quinapril (ACCUPRIL) 40 MG tablet Take 1 tablet by mouth two  times daily  . [DISCONTINUED]  spironolactone-hydrochlorothiazide (ALDACTAZIDE) 25-25 MG per tablet Take 1 tablet by mouth  daily   No facility-administered encounter medications on file as of 06/11/2015.    Review of Systems  Constitutional: Negative for appetite change and unexpected weight change.  HENT: Negative for congestion and sinus pressure.   Eyes: Negative for pain and visual disturbance.  Respiratory: Negative for cough, chest tightness and shortness of breath.   Cardiovascular: Negative for chest pain, palpitations and leg swelling.  Gastrointestinal: Negative for nausea, vomiting, abdominal pain and diarrhea.  Genitourinary: Negative for dysuria and difficulty urinating.  Musculoskeletal: Negative for back pain and joint swelling.  Skin: Negative for color change and rash.  Neurological: Negative for dizziness, light-headedness and headaches.  Hematological: Negative for adenopathy. Does not bruise/bleed easily.  Psychiatric/Behavioral: Negative for dysphoric mood and agitation.       Objective:     Blood pressure rechecked by me:  132/74  Physical Exam  Constitutional: She is oriented to person, place, and time. She appears well-developed and well-nourished. No distress.  HENT:  Nose: Nose normal.  Mouth/Throat: Oropharynx is clear and moist.  Eyes: Right eye exhibits no discharge. Left eye exhibits no discharge. No scleral icterus.  Neck: Neck supple. No thyromegaly present.  Cardiovascular: Normal rate and regular rhythm.   Pulmonary/Chest: Breath sounds normal. No accessory muscle usage. No tachypnea. No respiratory distress. She has no decreased breath sounds. She has no wheezes. She has no rhonchi. Right breast exhibits no inverted nipple, no mass, no nipple discharge and no tenderness (no axillary adenopathy). Left breast exhibits no inverted nipple, no mass, no nipple discharge and no tenderness (no axilarry adenopathy).  Abdominal: Soft. Bowel sounds are normal. There is no tenderness.    Musculoskeletal: She exhibits no edema or tenderness.  Lymphadenopathy:    She has no cervical adenopathy.  Neurological: She is alert and oriented to person, place, and time.  Skin: Skin is warm. No rash noted. No erythema.  Psychiatric: She has a normal mood and affect. Her behavior is normal.    BP 126/60 mmHg  Pulse 54  Temp(Src) 98.2 F (36.8 C) (Oral)  Resp 18  Ht 4' 11.5" (1.511 m)  Wt 220 lb 4 oz (99.905 kg)  BMI 43.76 kg/m2  SpO2 97%  LMP 08/11/1995 Wt Readings from Last 3 Encounters:  06/11/15 220 lb 4 oz (99.905 kg)  03/05/15 223 lb (101.152 kg)  11/27/14 220 lb 4 oz (99.905 kg)     Lab Results  Component Value Date   WBC 5.6 07/22/2014   HGB 14.7 07/22/2014   HCT 44 07/22/2014   PLT 251 07/22/2014   GLUCOSE 121* 03/05/2014   CHOL 187 03/25/2015  TRIG 182* 03/25/2015   HDL 41 03/25/2015   LDLCALC 110 03/25/2015   ALT 18 03/25/2015   AST 17 03/25/2015   NA 140 03/25/2015   K 5.2 03/25/2015   CL 98 03/05/2014   CREATININE 1.1 03/25/2015   BUN 23* 03/25/2015   CO2 29 03/05/2014   TSH 0.37* 03/25/2015   INR 0.9 12/19/2011   HGBA1C 7.5* 03/25/2015       Assessment & Plan:   Problem List Items Addressed This Visit    Diabetes mellitus ([place])    Low carb diet and exercise.  Follow met b and a1c.  Discussed diet and exercise.  Last a1c 7.1.        Relevant Medications   pravastatin (PRAVACHOL) 40 MG tablet   quinapril (ACCUPRIL) 40 MG tablet   GERD (gastroesophageal reflux disease)    EGD 07/08/13 - normal.  On protonix.  Symptoms controlled.        Relevant Medications   pantoprazole (PROTONIX) 40 MG tablet   Health care maintenance    Physical today 06/11/15.  Mammogram 11/26/14 - Birads I.  Colonoscopy 06/2013.        Hypercholesterolemia    Low cholesterol diet and exercise.  Follow lipid panel and liver function tests.  On pravastatin.        Relevant Medications   pravastatin (PRAVACHOL) 40 MG tablet   quinapril (ACCUPRIL) 40 MG  tablet   amLODipine (NORVASC) 5 MG tablet   spironolactone-hydrochlorothiazide (ALDACTAZIDE) 25-25 MG tablet   cloNIDine (CATAPRES) 0.1 MG tablet   Hypertension - Primary    Blood pressure under good control.  Continue same medication regimen.  Follow pressures.  Follow metabolic panel.        Relevant Medications   pravastatin (PRAVACHOL) 40 MG tablet   quinapril (ACCUPRIL) 40 MG tablet   amLODipine (NORVASC) 5 MG tablet   spironolactone-hydrochlorothiazide (ALDACTAZIDE) 25-25 MG tablet   cloNIDine (CATAPRES) 0.1 MG tablet   Hypothyroidism    On thyroid replacement.  Follow tsh.        Renal insufficiency    Stay hydrated.  Follow renal function.        Severe obesity (BMI >= 40) ([facility])    Diet and exercise.  Follow.            Einar Pheasant, MD

## 2015-06-11 NOTE — Progress Notes (Signed)
Pre-visit discussion using our clinic review tool. No additional management support is needed unless otherwise documented below in the visit note.  

## 2015-06-14 ENCOUNTER — Encounter: Payer: Self-pay | Admitting: Internal Medicine

## 2015-06-14 NOTE — Assessment & Plan Note (Signed)
Blood pressure under good control.  Continue same medication regimen.  Follow pressures.  Follow metabolic panel.   

## 2015-06-14 NOTE — Assessment & Plan Note (Signed)
Diet and exercise.  Follow.  

## 2015-06-14 NOTE — Assessment & Plan Note (Signed)
Physical today 06/11/15.  Mammogram 11/26/14 - Birads I.  Colonoscopy 06/2013.

## 2015-06-14 NOTE — Assessment & Plan Note (Signed)
Low cholesterol diet and exercise.  Follow lipid panel and liver function tests.  On pravastatin.   

## 2015-06-14 NOTE — Assessment & Plan Note (Signed)
Low carb diet and exercise.  Follow met b and a1c.  Discussed diet and exercise.  Last a1c 7.1.

## 2015-06-14 NOTE — Assessment & Plan Note (Signed)
EGD 07/08/13 - normal.  On protonix.  Symptoms controlled.   

## 2015-06-14 NOTE — Assessment & Plan Note (Signed)
Stay hydrated.  Follow renal function.  

## 2015-06-14 NOTE — Assessment & Plan Note (Signed)
On thyroid replacement.  Follow tsh.  

## 2015-07-27 ENCOUNTER — Other Ambulatory Visit: Payer: Self-pay | Admitting: Internal Medicine

## 2015-07-28 LAB — CBC WITH DIFFERENTIAL/PLATELET
BASOS ABS: 0 10*3/uL (ref 0.0–0.2)
Basos: 0 %
EOS (ABSOLUTE): 0.2 10*3/uL (ref 0.0–0.4)
Eos: 3 %
Hematocrit: 42.3 % (ref 34.0–46.6)
Hemoglobin: 14.6 g/dL (ref 11.1–15.9)
IMMATURE GRANS (ABS): 0 10*3/uL (ref 0.0–0.1)
Immature Granulocytes: 0 %
LYMPHS ABS: 1.3 10*3/uL (ref 0.7–3.1)
LYMPHS: 21 %
MCH: 30.4 pg (ref 26.6–33.0)
MCHC: 34.5 g/dL (ref 31.5–35.7)
MCV: 88 fL (ref 79–97)
MONOS ABS: 0.5 10*3/uL (ref 0.1–0.9)
Monocytes: 8 %
NEUTROS ABS: 4.1 10*3/uL (ref 1.4–7.0)
Neutrophils: 68 %
PLATELETS: 256 10*3/uL (ref 150–379)
RBC: 4.81 x10E6/uL (ref 3.77–5.28)
RDW: 12.8 % (ref 12.3–15.4)
WBC: 6.1 10*3/uL (ref 3.4–10.8)

## 2015-07-28 LAB — HEPATIC FUNCTION PANEL
ALBUMIN: 4.5 g/dL (ref 3.5–4.8)
ALK PHOS: 55 IU/L (ref 39–117)
ALT: 12 IU/L (ref 0–32)
AST: 17 IU/L (ref 0–40)
BILIRUBIN, DIRECT: 0.13 mg/dL (ref 0.00–0.40)
Bilirubin Total: 0.7 mg/dL (ref 0.0–1.2)
TOTAL PROTEIN: 6.8 g/dL (ref 6.0–8.5)

## 2015-07-28 LAB — HGB A1C W/O EAG: Hgb A1c MFr Bld: 8.6 % — ABNORMAL HIGH (ref 4.8–5.6)

## 2015-07-28 LAB — BASIC METABOLIC PANEL
BUN / CREAT RATIO: 23 (ref 11–26)
BUN: 29 mg/dL — ABNORMAL HIGH (ref 8–27)
CHLORIDE: 96 mmol/L — AB (ref 97–106)
CO2: 28 mmol/L (ref 18–29)
Calcium: 9.8 mg/dL (ref 8.7–10.3)
Creatinine, Ser: 1.24 mg/dL — ABNORMAL HIGH (ref 0.57–1.00)
GFR calc Af Amer: 49 mL/min/{1.73_m2} — ABNORMAL LOW (ref >59)
GFR calc non Af Amer: 43 mL/min/{1.73_m2} — ABNORMAL LOW (ref >59)
GLUCOSE: 144 mg/dL — AB (ref 65–99)
POTASSIUM: 5.8 mmol/L — AB (ref 3.5–5.2)
SODIUM: 138 mmol/L (ref 136–144)

## 2015-07-28 LAB — LIPID PANEL WITH LDL/HDL RATIO
CHOLESTEROL TOTAL: 188 mg/dL (ref 100–199)
HDL: 40 mg/dL (ref >39)
LDL Calculated: 107 mg/dL — ABNORMAL HIGH (ref 0–99)
LDl/HDL Ratio: 2.7 ratio units (ref 0.0–3.2)
Triglycerides: 203 mg/dL — ABNORMAL HIGH (ref 0–149)
VLDL Cholesterol Cal: 41 mg/dL — ABNORMAL HIGH (ref 5–40)

## 2015-07-28 LAB — MICROALBUMIN / CREATININE URINE RATIO
Creatinine, Urine: 13.9 mg/dL
MICROALB/CREAT RATIO: <21.6 mg/g creat (ref 0.0–30.0)

## 2015-07-28 LAB — TSH: TSH: 2.22 u[IU]/mL (ref 0.450–4.500)

## 2015-07-31 ENCOUNTER — Other Ambulatory Visit: Payer: Self-pay | Admitting: Internal Medicine

## 2015-07-31 ENCOUNTER — Telehealth: Payer: Self-pay | Admitting: Internal Medicine

## 2015-07-31 NOTE — Telephone Encounter (Signed)
Basic metobolic 8 test  Glucose (fasting): 1.07 (H) BUN: 22 Creatine: 1.07 (H) Sodium: 142 Potassium: 5.0 Chloride: 99 Carbon dioxide: 30 (H) Calcium: 9.5

## 2015-07-31 NOTE — Telephone Encounter (Signed)
Left results on voice mail 

## 2015-07-31 NOTE — Telephone Encounter (Signed)
Please notify pt that her kidney function and her potassium are better.  Continue 1/2 spironolactone.  Monitor blood pressure.

## 2015-08-01 LAB — BASIC METABOLIC PANEL
BUN / CREAT RATIO: 21 (ref 11–26)
BUN: 22 mg/dL (ref 8–27)
CO2: 30 mmol/L — ABNORMAL HIGH (ref 18–29)
CREATININE: 1.07 mg/dL — AB (ref 0.57–1.00)
Calcium: 9.5 mg/dL (ref 8.7–10.3)
Chloride: 99 mmol/L (ref 97–106)
GFR calc non Af Amer: 51 mL/min/{1.73_m2} — ABNORMAL LOW (ref >59)
GFR, EST AFRICAN AMERICAN: 59 mL/min/{1.73_m2} — AB (ref >59)
Glucose: 159 mg/dL — ABNORMAL HIGH (ref 65–99)
Potassium: 5 mmol/L (ref 3.5–5.2)
Sodium: 142 mmol/L (ref 136–144)

## 2015-08-14 ENCOUNTER — Encounter: Payer: Self-pay | Admitting: Internal Medicine

## 2015-08-14 ENCOUNTER — Ambulatory Visit (INDEPENDENT_AMBULATORY_CARE_PROVIDER_SITE_OTHER): Payer: 59 | Admitting: Internal Medicine

## 2015-08-14 VITALS — BP 120/80 | HR 58 | Temp 97.8°F | Resp 18 | Ht 59.25 in | Wt 214.0 lb

## 2015-08-14 DIAGNOSIS — E119 Type 2 diabetes mellitus without complications: Secondary | ICD-10-CM

## 2015-08-14 DIAGNOSIS — E7800 Pure hypercholesterolemia, unspecified: Secondary | ICD-10-CM

## 2015-08-14 DIAGNOSIS — I10 Essential (primary) hypertension: Secondary | ICD-10-CM

## 2015-08-14 DIAGNOSIS — N289 Disorder of kidney and ureter, unspecified: Secondary | ICD-10-CM

## 2015-08-14 NOTE — Progress Notes (Signed)
Patient ID: Lorraine BushmanJanet Louise Paulick, female   DOB: [birth year], 74 y.o.   MRN: [id number]   Subjective:    Patient ID: Lorraine BushmanJanet Louise Dack, female    DOB: [birth year], 74 y.o.   MRN: [id number]  HPI  Patient with past history of hypercholesterolemia, diabetes and hypertension.  She comes in today to follow up on these issues.  Here to f/u on her sugars.  Recent a1c elevated -  8.6.  Since finding out about the lab results, she has been adjusting her diet.  Has significantly changed her diet and decreased carbs.  Sugars much improved.  See attached list.  Sugars averaging - 118-150s in the am and 120-140s in the pm.  She prefers not to add more medication and wants to work on her diet and exercise.  Feels better.  Has lost weight.  No cardiac symptoms with increased activity or exertion.  No sob.  No nausea or vomiting.  Bowels stable.    Past Medical History  Diagnosis Date  . Hypertension   . Diabetes mellitus ([facility])   . Hypercholesterolemia   . Glaucoma   . GERD (gastroesophageal reflux disease)   . Hypothyroidism    Past Surgical History  Procedure Laterality Date  . 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 Eye surgery Bilateral 08/2014, 2.2016    laser surgery in preparation for glaucoma surgery   Family History  Problem Relation Age of Onset  . Mental illness Father     suicide  . Liver disease Father     alcohol  . Glaucoma Father   . Alcohol abuse Father   . Glaucoma Mother   . Osteoporosis Mother   . Diabetes Sister   . Breast cancer Sister   . Colon cancer Neg Hx   . Blindness Father    Social History   Social History  . Marital Status: Divorced    Spouse Name: N/A  . Number of Children: 1  . Years of Education: N/A   Social History Main Topics  . Smoking status: Never Smoker   . Smokeless tobacco: Never Used  . Alcohol Use: No  . Drug Use: No  . Sexual Activity: Not Asked   Other Topics Concern  . None   Social History Narrative    Outpatient Encounter  Prescriptions as of 08/14/2015  Medication Sig  . amLODipine (NORVASC) 5 MG tablet Take 1 tablet (5 mg total) by mouth daily.  Marland Kitchen. aspirin 81 MG tablet Take 81 mg by mouth daily.  . brimonidine (ALPHAGAN) 0.2 % ophthalmic solution Place 1 drop into both eyes 2 (two) times daily.  . calcium-vitamin D (CALCIUM 500+D) 500-200 MG-UNIT per tablet Take 1 tablet by mouth daily.  . Cholecalciferol (VITAMIN D-3) 1000 UNITS CAPS Take 1 capsule by mouth daily.  . cloNIDine (CATAPRES) 0.1 MG tablet Take 1 tablet by mouth 3  times a day  . glucosamine-chondroitin 500-400 MG tablet Take 1 tablet by mouth daily.  Marland Kitchen. glucose blood (ACCU-CHEK COMPACT STRIPS) test strip Check sugar daily Dx E11.9  . glucose blood test strip accuchek compact test strips Check blood sugar tid  . latanoprost (XALATAN) 0.005 % ophthalmic solution As directed  . LevOCARNitine (L-CARNITINE) 250 MG CAPS Take 1 capsule by mouth daily.  Marland Kitchen. levothyroxine (SYNTHROID) 112 MCG tablet Take 1 tablet (112 mcg total) by mouth daily.  Marland Kitchen. levothyroxine (SYNTHROID, LEVOTHROID) 100 MCG tablet Take 1 tablet (100 mcg total) by mouth daily.  . Multiple Vitamin (MULTIVITAMIN) tablet  Take 1 tablet by mouth daily.  Marland Kitchen omega-3 acid ethyl esters (LOVAZA) 1 G capsule Take 2 g by mouth daily.  . pantoprazole (PROTONIX) 40 MG tablet Take 1 tablet by mouth  daily  . pravastatin (PRAVACHOL) 40 MG tablet Take 2 tablets by mouth  daily  . quinapril (ACCUPRIL) 40 MG tablet Take 1 tablet by mouth two  times daily  . sitaGLIPtin (JANUVIA) 100 MG tablet Take 100 mg by mouth daily.  Marland Kitchen spironolactone-hydrochlorothiazide (ALDACTAZIDE) 25-25 MG tablet Take 1 tablet by mouth  daily  . timolol (TIMOPTIC) 0.5 % ophthalmic solution    No facility-administered encounter medications on file as of 08/14/2015.    Review of Systems  Constitutional:       Has adjusted her diet.  Has lost weight.  Sugars much improved.   HENT: Negative for congestion and sinus pressure.     Respiratory: Negative for cough, chest tightness and shortness of breath.   Cardiovascular: Negative for chest pain, palpitations and leg swelling.  Gastrointestinal: Negative for nausea, vomiting and abdominal pain.  Genitourinary: Negative for dysuria and difficulty urinating.  Musculoskeletal: Negative for back pain and joint swelling.  Skin: Negative for color change and rash.  Neurological: Negative for dizziness, light-headedness and headaches.  Psychiatric/Behavioral: Negative for dysphoric mood and agitation.       Objective:     Blood pressure rechecked by me:  142/68  Physical Exam  Constitutional: She appears well-developed and well-nourished. No distress.  HENT:  Nose: Nose normal.  Mouth/Throat: Oropharynx is clear and moist.  Neck: Neck supple. No thyromegaly present.  Cardiovascular: Normal rate and regular rhythm.   Pulmonary/Chest: Breath sounds normal. No respiratory distress. She has no wheezes.  Abdominal: Soft. Bowel sounds are normal. There is no tenderness.  Musculoskeletal: She exhibits no edema or tenderness.  Lymphadenopathy:    She has no cervical adenopathy.  Skin: No rash noted. No erythema.  Psychiatric: She has a normal mood and affect. Her behavior is normal.    BP 120/80 mmHg  Pulse 58  Temp(Src) 97.8 F (36.6 C) (Oral)  Resp 18  Ht 4' 11.25" (1.505 m)  Wt 214 lb (97.07 kg)  BMI 42.86 kg/m2  SpO2 97%  LMP 08/11/1995 Wt Readings from Last 3 Encounters:  08/14/15 214 lb (97.07 kg)  06/11/15 220 lb 4 oz (99.905 kg)  03/05/15 223 lb (101.152 kg)     Lab Results  Component Value Date   WBC 6.1 07/27/2015   HGB 14.7 07/22/2014   HCT 42.3 07/27/2015   PLT 251 07/22/2014   GLUCOSE 159* 07/31/2015   CHOL 188 07/27/2015   TRIG 203* 07/27/2015   HDL 40 07/27/2015   LDLCALC 107* 07/27/2015   ALT 12 07/27/2015   AST 17 07/27/2015   NA 142 07/31/2015   K 5.0 07/31/2015   CL 99 07/31/2015   CREATININE 1.07* 07/31/2015   BUN 22  07/31/2015   CO2 30* 07/31/2015   TSH 2.220 07/27/2015   INR 0.9 12/19/2011   HGBA1C 8.6* 07/27/2015       Assessment & Plan:   Problem List Items Addressed This Visit    Diabetes mellitus ([facility])    Recent a1c elevated as outlined.  She has adjusted her diet and has lost weight.  Sugars improved as outlined.  She wants to hold on adding more medication.  Wants to try diet and exercise.  Since improved, will hold on additional medication.  Follow sugars.  Send in readings over the next  few weeks.  Follow.        Hypercholesterolemia    Low cholesterol diet and exercise.  Follow lipid panel and liver function tests.  Continue on pravastatin.   Lab Results  Component Value Date   CHOL 188 07/27/2015   HDL 40 07/27/2015   LDLCALC 107* 07/27/2015   TRIG 203* 07/27/2015        Hypertension - Primary    Blood pressure has been doing well.  Continue same medication regimen.  Follow pressures.  Follow metabolic panel.        Renal insufficiency    Recent Cr slightly increased.  On recheck improved.  Continue to stay hydrated.  Follow metabolic panel.        Severe obesity (BMI >= 40) ([facility])    Has adjusted her diet.  Has lost weight.  Continue diet and exercise.            [place], MD

## 2015-08-14 NOTE — Progress Notes (Signed)
Pre-visit discussion using our clinic review tool. No additional management support is needed unless otherwise documented below in the visit note.  

## 2015-08-16 ENCOUNTER — Encounter: Payer: Self-pay | Admitting: Internal Medicine

## 2015-08-16 NOTE — Assessment & Plan Note (Signed)
Recent a1c elevated as outlined.  She has adjusted her diet and has lost weight.  Sugars improved as outlined.  She wants to hold on adding more medication.  Wants to try diet and exercise.  Since improved, will hold on additional medication.  Follow sugars.  Send in readings over the next few weeks.  Follow.

## 2015-08-16 NOTE — Assessment & Plan Note (Signed)
Low cholesterol diet and exercise.  Follow lipid panel and liver function tests.  Continue on pravastatin.   Lab Results  Component Value Date   CHOL 188 07/27/2015   HDL 40 07/27/2015   LDLCALC 107* 07/27/2015   TRIG 203* 07/27/2015

## 2015-08-16 NOTE — Assessment & Plan Note (Signed)
Blood pressure has been doing well.  Continue same medication regimen.  Follow pressures.  Follow metabolic panel.  

## 2015-08-16 NOTE — Assessment & Plan Note (Signed)
Recent Cr slightly increased.  On recheck improved.  Continue to stay hydrated.  Follow metabolic panel.

## 2015-08-16 NOTE — Assessment & Plan Note (Signed)
Has adjusted her diet.  Has lost weight.  Continue diet and exercise.

## 2015-09-03 ENCOUNTER — Encounter: Payer: Self-pay | Admitting: *Deleted

## 2015-10-15 ENCOUNTER — Ambulatory Visit (INDEPENDENT_AMBULATORY_CARE_PROVIDER_SITE_OTHER): Payer: 59 | Admitting: Internal Medicine

## 2015-10-15 ENCOUNTER — Encounter: Payer: Self-pay | Admitting: Internal Medicine

## 2015-10-15 VITALS — BP 146/68 | HR 60 | Temp 97.8°F | Resp 14 | Ht 59.0 in | Wt 211.8 lb

## 2015-10-15 DIAGNOSIS — N289 Disorder of kidney and ureter, unspecified: Secondary | ICD-10-CM

## 2015-10-15 DIAGNOSIS — E039 Hypothyroidism, unspecified: Secondary | ICD-10-CM

## 2015-10-15 DIAGNOSIS — I10 Essential (primary) hypertension: Secondary | ICD-10-CM | POA: Diagnosis not present

## 2015-10-15 DIAGNOSIS — E119 Type 2 diabetes mellitus without complications: Secondary | ICD-10-CM

## 2015-10-15 DIAGNOSIS — K219 Gastro-esophageal reflux disease without esophagitis: Secondary | ICD-10-CM

## 2015-10-15 DIAGNOSIS — E7800 Pure hypercholesterolemia, unspecified: Secondary | ICD-10-CM

## 2015-10-15 NOTE — Progress Notes (Signed)
Patient ID: Lorraine Foley, female   DOB: 06/21/1941, 75 y.o.   MRN: [id number]   Subjective:    Patient ID: Lorraine Foley, female    DOB: 11-03-1940, 75 y.o.   MRN: [id number]  HPI  Patient with past history of hypercholesterolemia, GERD, hypothyroidism and hypertension.  She comes in today to follow up on these issues.  Her sugar was elevated on her last lab check.  She has adjusted her diet.  Has lost weight.  AM sugars averaging 140s and pm sugars averaging 110-160.  She is trying to be more active.  No chest pain or tightness.  No sob.  No acid reflux.  No abdominal pain or cramping.  Bowels stable.     Past Medical History  Diagnosis Date  . Hypertension   . Diabetes mellitus ([place])   . Hypercholesterolemia   . Glaucoma   . GERD (gastroesophageal reflux disease)   . Hypothyroidism    Past Surgical History  Procedure Laterality Date  . 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 Eye surgery Bilateral 08/2014, 2.2016    laser surgery in preparation for glaucoma surgery   Family History  Problem Relation Age of Onset  . Mental illness Father     suicide  . Liver disease Father     alcohol  . Glaucoma Father   . Alcohol abuse Father   . Glaucoma Mother   . Osteoporosis Mother   . Diabetes Sister   . Breast cancer Sister   . Colon cancer Neg Hx   . Blindness Father    Social History   Social History  . Marital Status: Divorced    Spouse Name: N/A  . Number of Children: 1  . Years of Education: N/A   Social History Main Topics  . Smoking status: Never Smoker   . Smokeless tobacco: Never Used  . Alcohol Use: No  . Drug Use: No  . Sexual Activity: Not Asked   Other Topics Concern  . None   Social History Narrative    Outpatient Encounter Prescriptions as of 10/15/2015  Medication Sig  . amLODipine (NORVASC) 5 MG tablet Take 1 tablet (5 mg total) by mouth daily.  Marland Kitchen aspirin 81 MG tablet Take 81 mg by mouth daily.  . brimonidine (ALPHAGAN) 0.2 %  ophthalmic solution Place 1 drop into both eyes 2 (two) times daily.  . calcium-vitamin D (CALCIUM 500+D) 500-200 MG-UNIT per tablet Take 1 tablet by mouth daily.  . Cholecalciferol (VITAMIN D-3) 1000 UNITS CAPS Take 1 capsule by mouth daily.  . cloNIDine (CATAPRES) 0.1 MG tablet Take 1 tablet by mouth 3  times a day  . glucosamine-chondroitin 500-400 MG tablet Take 1 tablet by mouth daily.  Marland Kitchen glucose blood (ACCU-CHEK COMPACT STRIPS) test strip Check sugar daily Dx E11.9  . glucose blood test strip accuchek compact test strips Check blood sugar tid  . latanoprost (XALATAN) 0.005 % ophthalmic solution As directed  . LevOCARNitine (L-CARNITINE) 250 MG CAPS Take 1 capsule by mouth daily.  Marland Kitchen levothyroxine (SYNTHROID) 112 MCG tablet Take 1 tablet (112 mcg total) by mouth daily.  Marland Kitchen levothyroxine (SYNTHROID, LEVOTHROID) 100 MCG tablet Take 1 tablet (100 mcg total) by mouth daily.  . Multiple Vitamin (MULTIVITAMIN) tablet Take 1 tablet by mouth daily.  Marland Kitchen omega-3 acid ethyl esters (LOVAZA) 1 G capsule Take 2 g by mouth daily.  . pantoprazole (PROTONIX) 40 MG tablet Take 1 tablet by mouth  daily  . pravastatin (PRAVACHOL)  40 MG tablet Take 2 tablets by mouth  daily  . quinapril (ACCUPRIL) 40 MG tablet Take 1 tablet by mouth two  times daily  . sitaGLIPtin (JANUVIA) 100 MG tablet Take 100 mg by mouth daily.  Marland Kitchen spironolactone-hydrochlorothiazide (ALDACTAZIDE) 25-25 MG tablet Take 1 tablet by mouth  daily  . timolol (TIMOPTIC) 0.5 % ophthalmic solution    No facility-administered encounter medications on file as of 10/15/2015.    Review of Systems  Constitutional:       Has adjusted her diet.  Lost weight.    HENT: Negative for congestion and sinus pressure.   Respiratory: Negative for cough, chest tightness and shortness of breath.   Cardiovascular: Negative for chest pain, palpitations and leg swelling.  Gastrointestinal: Negative for nausea, vomiting, abdominal pain and diarrhea.    Musculoskeletal: Negative for back pain and joint swelling.  Skin: Negative for color change and rash.  Neurological: Negative for dizziness, light-headedness and headaches.  Psychiatric/Behavioral: Negative for dysphoric mood and agitation.       Objective:    Physical Exam  Constitutional: She appears well-developed and well-nourished. No distress.  HENT:  Nose: Nose normal.  Mouth/Throat: Oropharynx is clear and moist.  Neck: Neck supple. No thyromegaly present.  Cardiovascular: Normal rate and regular rhythm.   Pulmonary/Chest: Breath sounds normal. No respiratory distress. She has no wheezes.  Abdominal: Soft. Bowel sounds are normal. There is no tenderness.  Musculoskeletal: She exhibits no edema or tenderness.  Lymphadenopathy:    She has no cervical adenopathy.  Skin: No rash noted. No erythema.  Psychiatric: She has a normal mood and affect. Her behavior is normal.    BP 146/68 mmHg  Pulse 60  Temp(Src) 97.8 F (36.6 C) (Oral)  Resp 14  Ht _0  (1.499 m)  Wt 211 lb 12.8 oz (96.072 kg)  BMI 42.76 kg/m2  SpO2 98%  LMP 08/11/1995 Wt Readings from Last 3 Encounters:  10/15/15 211 lb 12.8 oz (96.072 kg)  08/14/15 214 lb (97.07 kg)  06/11/15 220 lb 4 oz (99.905 kg)     Lab Results  Component Value Date   WBC 6.1 07/27/2015   HGB 14.7 07/22/2014   HCT 42.3 07/27/2015   PLT 256 07/27/2015   GLUCOSE 159* 07/31/2015   CHOL 188 07/27/2015   TRIG 203* 07/27/2015   HDL 40 07/27/2015   LDLCALC 107* 07/27/2015   ALT 12 07/27/2015   AST 17 07/27/2015   NA 142 07/31/2015   K 5.0 07/31/2015   CL 99 07/31/2015   CREATININE 1.07* 07/31/2015   BUN 22 07/31/2015   CO2 30* 07/31/2015   TSH 2.220 07/27/2015   INR 0.9 12/19/2011   HGBA1C 8.6* 07/27/2015       Assessment & Plan:   Problem List Items Addressed This Visit    Diabetes mellitus ([place])    Sugars as outlined.  Continue diet and exercise.  Follow met b and a1c.        GERD (gastroesophageal reflux  disease)    On protonix.  Symptoms controlled.        Hypercholesterolemia    Continue on pravastatin.  Low cholesterol diet and exercise.  Follow lipid panel and liver function tests.        Hypertension - Primary    Blood pressure under good control.  Continue same medication regimen.  Follow pressures.  Follow metabolic panel.        Hypothyroidism    On thyroid medication.  Follow tsh.  Renal insufficiency    Stay hydrated.  Follow metabolic panel.  Cr has been stable.        Severe obesity (BMI >= 40) ([facility])    Diet and exercise.  Has adjusted her diet.  Lost weight.  Follow.           Einar Pheasant, MD

## 2015-10-18 ENCOUNTER — Encounter: Payer: Self-pay | Admitting: Internal Medicine

## 2015-10-18 NOTE — Assessment & Plan Note (Signed)
Stay hydrated.  Follow metabolic panel.  Cr has been stable.

## 2015-10-18 NOTE — Assessment & Plan Note (Signed)
On thyroid medication.  Follow tsh.  

## 2015-10-18 NOTE — Assessment & Plan Note (Signed)
Blood pressure under good control.  Continue same medication regimen.  Follow pressures.  Follow metabolic panel.   

## 2015-10-18 NOTE — Assessment & Plan Note (Signed)
Sugars as outlined.  Continue diet and exercise.  Follow met b and a1c.

## 2015-10-18 NOTE — Assessment & Plan Note (Signed)
Continue on pravastatin.  Low cholesterol diet and exercise.  Follow lipid panel and liver function tests.   

## 2015-10-18 NOTE — Assessment & Plan Note (Signed)
Diet and exercise.  Has adjusted her diet.  Lost weight.  Follow.

## 2015-10-18 NOTE — Assessment & Plan Note (Signed)
On protonix.  Symptoms controlled.   

## 2015-11-17 ENCOUNTER — Other Ambulatory Visit: Payer: Self-pay | Admitting: Internal Medicine

## 2015-11-24 ENCOUNTER — Other Ambulatory Visit: Payer: Self-pay | Admitting: Internal Medicine

## 2015-11-25 LAB — LIPID PANEL W/O CHOL/HDL RATIO
Cholesterol, Total: 188 mg/dL (ref 100–199)
HDL: 39 mg/dL — ABNORMAL LOW (ref >39)
LDL Calculated: 117 mg/dL — ABNORMAL HIGH (ref 0–99)
TRIGLYCERIDES: 162 mg/dL — AB (ref 0–149)
VLDL Cholesterol Cal: 32 mg/dL (ref 5–40)

## 2015-11-25 LAB — BASIC METABOLIC PANEL
BUN / CREAT RATIO: 21 (ref 11–26)
BUN: 21 mg/dL (ref 8–27)
CHLORIDE: 93 mmol/L — AB (ref 96–106)
CO2: 29 mmol/L (ref 18–29)
Calcium: 10.3 mg/dL (ref 8.7–10.3)
Creatinine, Ser: 1.02 mg/dL — ABNORMAL HIGH (ref 0.57–1.00)
GFR calc Af Amer: 63 mL/min/{1.73_m2} (ref >59)
GFR calc non Af Amer: 54 mL/min/{1.73_m2} — ABNORMAL LOW (ref >59)
GLUCOSE: 104 mg/dL — AB (ref 65–99)
Potassium: 4.4 mmol/L (ref 3.5–5.2)
SODIUM: 139 mmol/L (ref 134–144)

## 2015-11-25 LAB — TSH: TSH: 0.8 u[IU]/mL (ref 0.450–4.500)

## 2015-11-25 LAB — HGB A1C W/O EAG: Hgb A1c MFr Bld: 6 % — ABNORMAL HIGH (ref 4.8–5.6)

## 2015-11-25 LAB — HEPATIC FUNCTION PANEL
ALBUMIN: 4.6 g/dL (ref 3.5–4.8)
ALK PHOS: 57 IU/L (ref 39–117)
ALT: 17 IU/L (ref 0–32)
AST: 21 IU/L (ref 0–40)
BILIRUBIN, DIRECT: 0.19 mg/dL (ref 0.00–0.40)
Bilirubin Total: 0.9 mg/dL (ref 0.0–1.2)
TOTAL PROTEIN: 7.1 g/dL (ref 6.0–8.5)

## 2015-11-30 ENCOUNTER — Encounter: Payer: Self-pay | Admitting: *Deleted

## 2015-12-10 ENCOUNTER — Other Ambulatory Visit: Payer: Self-pay | Admitting: Internal Medicine

## 2015-12-10 NOTE — Telephone Encounter (Signed)
Rx refill sent to pharmacy. 

## 2016-01-04 ENCOUNTER — Ambulatory Visit: Payer: 59 | Admitting: Internal Medicine

## 2016-01-31 DIAGNOSIS — M1712 Unilateral primary osteoarthritis, left knee: Secondary | ICD-10-CM | POA: Insufficient documentation

## 2016-02-02 ENCOUNTER — Encounter: Payer: Self-pay | Admitting: Internal Medicine

## 2016-02-02 ENCOUNTER — Ambulatory Visit (INDEPENDENT_AMBULATORY_CARE_PROVIDER_SITE_OTHER): Payer: 59 | Admitting: Internal Medicine

## 2016-02-02 VITALS — BP 124/70 | HR 52 | Temp 98.0°F | Wt 207.0 lb

## 2016-02-02 DIAGNOSIS — E7800 Pure hypercholesterolemia, unspecified: Secondary | ICD-10-CM | POA: Diagnosis not present

## 2016-02-02 DIAGNOSIS — E119 Type 2 diabetes mellitus without complications: Secondary | ICD-10-CM

## 2016-02-02 DIAGNOSIS — K219 Gastro-esophageal reflux disease without esophagitis: Secondary | ICD-10-CM | POA: Diagnosis not present

## 2016-02-02 DIAGNOSIS — E039 Hypothyroidism, unspecified: Secondary | ICD-10-CM

## 2016-02-02 DIAGNOSIS — I10 Essential (primary) hypertension: Secondary | ICD-10-CM

## 2016-02-02 NOTE — Progress Notes (Signed)
Pre visit review using our clinic review tool, if applicable. No additional management support is needed unless otherwise documented below in the visit note. 

## 2016-02-02 NOTE — Progress Notes (Signed)
Patient ID: Lorraine Foley, female   DOB: 31-Mar-1941, 75 y.o.   MRN: [id number]   Subjective:    Patient ID: Lorraine Foley, female    DOB: 25-May-1941, 75 y.o.   MRN: [id number]  HPI  Patient here for a scheduled follow up.  She has adjusted her diet.  Losing weight.  More active.  Feels better.  No chest pain.  No sob.  No acid reflux.  No abdominal pain or cramping.  Bowels stable.  Left knee pain.  Arthritis.  Seeing ortho.     Past Medical History  Diagnosis Date  . Hypertension   . Diabetes mellitus ([place])   . Hypercholesterolemia   . Glaucoma   . GERD (gastroesophageal reflux disease)   . Hypothyroidism    Past Surgical History  Procedure Laterality Date  . 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 Eye surgery Bilateral 08/2014, 2.2016    laser surgery in preparation for glaucoma surgery   Family History  Problem Relation Age of Onset  . Mental illness Father     suicide  . Liver disease Father     alcohol  . Glaucoma Father   . Alcohol abuse Father   . Glaucoma Mother   . Osteoporosis Mother   . Diabetes Sister   . Breast cancer Sister   . Colon cancer Neg Hx   . Blindness Father    Social History   Social History  . Marital Status: Divorced    Spouse Name: N/A  . Number of Children: 1  . Years of Education: N/A   Social History Main Topics  . Smoking status: Never Smoker   . Smokeless tobacco: Never Used  . Alcohol Use: No  . Drug Use: No  . Sexual Activity: Not Asked   Other Topics Concern  . None   Social History Narrative    Outpatient Encounter Prescriptions as of 02/02/2016  Medication Sig  . amLODipine (NORVASC) 5 MG tablet Take 1 tablet by mouth  daily  . aspirin 81 MG tablet Take 81 mg by mouth daily.  . brimonidine (ALPHAGAN) 0.2 % ophthalmic solution Place 1 drop into both eyes 2 (two) times daily.  . calcium-vitamin D (CALCIUM 500+D) 500-200 MG-UNIT per tablet Take 1 tablet by mouth daily.  . Cholecalciferol (VITAMIN D-3) 1000  UNITS CAPS Take 1 capsule by mouth daily.  . cloNIDine (CATAPRES) 0.1 MG tablet Take 1 tablet by mouth 3  times a day  . glucosamine-chondroitin 500-400 MG tablet Take 1 tablet by mouth daily.  Marland Kitchen glucose blood (ACCU-CHEK COMPACT STRIPS) test strip Check sugar daily Dx E11.9  . glucose blood test strip accuchek compact test strips Check blood sugar tid  . latanoprost (XALATAN) 0.005 % ophthalmic solution As directed  . LevOCARNitine (L-CARNITINE) 250 MG CAPS Take 1 capsule by mouth daily.  Marland Kitchen levothyroxine (SYNTHROID, LEVOTHROID) 100 MCG tablet Take 1 tablet by mouth  daily  . Multiple Vitamin (MULTIVITAMIN) tablet Take 1 tablet by mouth daily.  Marland Kitchen omega-3 acid ethyl esters (LOVAZA) 1 G capsule Take 2 g by mouth daily.  . pantoprazole (PROTONIX) 40 MG tablet Take 1 tablet by mouth  daily  . pravastatin (PRAVACHOL) 40 MG tablet Take 2 tablets by mouth  daily  . quinapril (ACCUPRIL) 40 MG tablet Take 1 tablet by mouth two  times daily  . sitaGLIPtin (JANUVIA) 100 MG tablet Take 100 mg by mouth daily.  Marland Kitchen spironolactone-hydrochlorothiazide (ALDACTAZIDE) 25-25 MG tablet Take 1 tablet by  mouth  daily  . timolol (TIMOPTIC) 0.5 % ophthalmic solution    No facility-administered encounter medications on file as of 02/02/2016.    Review of Systems  Constitutional: Negative for appetite change and unexpected weight change.       Has adjusted her diet.  Lost weight.   HENT: Negative for congestion and sinus pressure.   Respiratory: Negative for cough, chest tightness and shortness of breath.   Cardiovascular: Negative for chest pain, palpitations and leg swelling.  Gastrointestinal: Negative for nausea, vomiting, abdominal pain and diarrhea.  Genitourinary: Negative for dysuria and difficulty urinating.  Musculoskeletal: Negative for myalgias and back pain.  Skin: Negative for color change and rash.  Neurological: Negative for dizziness, light-headedness and headaches.  Psychiatric/Behavioral: Negative  for dysphoric mood and agitation.       Objective:    Physical Exam  Constitutional: She appears well-developed and well-nourished. No distress.  HENT:  Nose: Nose normal.  Mouth/Throat: Oropharynx is clear and moist.  Neck: Neck supple. No thyromegaly present.  Cardiovascular: Normal rate and regular rhythm.   Pulmonary/Chest: Breath sounds normal. No respiratory distress. She has no wheezes.  Abdominal: Soft. Bowel sounds are normal. There is no tenderness.  Musculoskeletal: She exhibits no edema or tenderness.  Lymphadenopathy:    She has no cervical adenopathy.  Skin: No rash noted. No erythema.  Psychiatric: She has a normal mood and affect. Her behavior is normal.    BP 124/70 mmHg  Pulse 52  Temp(Src) 98 F (36.7 C) (Oral)  Wt 207 lb (93.895 kg)  LMP 08/11/1995 Wt Readings from Last 3 Encounters:  02/02/16 207 lb (93.895 kg)  10/15/15 211 lb 12.8 oz (96.072 kg)  08/14/15 214 lb (97.07 kg)     Lab Results  Component Value Date   WBC 6.1 07/27/2015   HGB 14.7 07/22/2014   HCT 42.3 07/27/2015   PLT 256 07/27/2015   GLUCOSE 104* 11/24/2015   CHOL 188 11/24/2015   TRIG 162* 11/24/2015   HDL 39* 11/24/2015   LDLCALC 117* 11/24/2015   ALT 17 11/24/2015   AST 21 11/24/2015   NA 139 11/24/2015   K 4.4 11/24/2015   CL 93* 11/24/2015   CREATININE 1.02* 11/24/2015   BUN 21 11/24/2015   CO2 29 11/24/2015   TSH 0.800 11/24/2015   INR 0.9 12/19/2011   HGBA1C 6.0* 11/24/2015        Assessment & Plan:   Problem List Items Addressed This Visit    Diabetes mellitus ([place]) - Primary    Sugars doing better.  She has adjusted her diet.  Lost weight.  Feels better.  Follow met b and a1c.   Lab Results  Component Value Date   HGBA1C 6.0* 11/24/2015        GERD (gastroesophageal reflux disease)    Symptoms controlled on protonix.        Hypercholesterolemia    Low cholesterol diet and exercise.  Follow lipid panel.  On pravastatin.  Lost weight.   Lab  Results  Component Value Date   CHOL 188 11/24/2015   HDL 39* 11/24/2015   LDLCALC 117* 11/24/2015   TRIG 162* 11/24/2015        Hypertension    Blood pressure under good control.  Continue same medication regimen.  Follow pressures.  Follow metabolic panel.        Hypothyroidism    On thyroid replacement.  Follow tsh.            Lativia Velie,  MD

## 2016-02-07 ENCOUNTER — Encounter: Payer: Self-pay | Admitting: Internal Medicine

## 2016-02-07 NOTE — Assessment & Plan Note (Signed)
Low cholesterol diet and exercise.  Follow lipid panel.  On pravastatin.  Lost weight.   Lab Results  Component Value Date   CHOL 188 11/24/2015   HDL 39* 11/24/2015   LDLCALC 117* 11/24/2015   TRIG 162* 11/24/2015

## 2016-02-07 NOTE — Assessment & Plan Note (Signed)
Sugars doing better.  She has adjusted her diet.  Lost weight.  Feels better.  Follow met b and a1c.   Lab Results  Component Value Date   HGBA1C 6.0* 11/24/2015

## 2016-02-07 NOTE — Assessment & Plan Note (Signed)
Blood pressure under good control.  Continue same medication regimen.  Follow pressures.  Follow metabolic panel.   

## 2016-02-07 NOTE — Assessment & Plan Note (Signed)
Symptoms controlled on protonix.   

## 2016-02-07 NOTE — Assessment & Plan Note (Signed)
On thyroid replacement.  Follow tsh.  

## 2016-02-08 ENCOUNTER — Other Ambulatory Visit: Payer: Self-pay | Admitting: Internal Medicine

## 2016-02-08 NOTE — Telephone Encounter (Signed)
Received refill request electronically See drug warning between Spironolactone and Quinapril Okay to refill?

## 2016-02-09 NOTE — Telephone Encounter (Signed)
Ok's refill for clonidine, spironolactone and quinapril.

## 2016-05-24 ENCOUNTER — Other Ambulatory Visit: Payer: Self-pay | Admitting: Internal Medicine

## 2016-05-25 LAB — LIPID PANEL W/O CHOL/HDL RATIO
CHOLESTEROL TOTAL: 219 mg/dL — AB (ref 100–199)
HDL: 44 mg/dL (ref >39)
LDL Calculated: 138 mg/dL — ABNORMAL HIGH (ref 0–99)
TRIGLYCERIDES: 184 mg/dL — AB (ref 0–149)
VLDL Cholesterol Cal: 37 mg/dL (ref 5–40)

## 2016-05-25 LAB — BASIC METABOLIC PANEL
BUN/Creatinine Ratio: 26 (ref 12–28)
BUN: 27 mg/dL (ref 8–27)
CALCIUM: 10.1 mg/dL (ref 8.7–10.3)
CHLORIDE: 92 mmol/L — AB (ref 96–106)
CO2: 29 mmol/L (ref 18–29)
Creatinine, Ser: 1.04 mg/dL — ABNORMAL HIGH (ref 0.57–1.00)
GFR calc Af Amer: 61 mL/min/{1.73_m2} (ref >59)
GFR calc non Af Amer: 53 mL/min/{1.73_m2} — ABNORMAL LOW (ref >59)
GLUCOSE: 110 mg/dL — AB (ref 65–99)
POTASSIUM: 5.2 mmol/L (ref 3.5–5.2)
Sodium: 135 mmol/L (ref 134–144)

## 2016-05-25 LAB — HEPATIC FUNCTION PANEL
ALBUMIN: 4.7 g/dL (ref 3.5–4.8)
ALK PHOS: 52 IU/L (ref 39–117)
ALT: 13 IU/L (ref 0–32)
AST: 16 IU/L (ref 0–40)
Bilirubin Total: 0.7 mg/dL (ref 0.0–1.2)
Bilirubin, Direct: 0.16 mg/dL (ref 0.00–0.40)
Total Protein: 7 g/dL (ref 6.0–8.5)

## 2016-05-25 LAB — HGB A1C W/O EAG: Hgb A1c MFr Bld: 5.9 % — ABNORMAL HIGH (ref 4.8–5.6)

## 2016-05-25 LAB — TSH: TSH: 1.35 u[IU]/mL (ref 0.450–4.500)

## 2016-05-26 ENCOUNTER — Ambulatory Visit (INDEPENDENT_AMBULATORY_CARE_PROVIDER_SITE_OTHER): Payer: 59 | Admitting: Family Medicine

## 2016-05-26 ENCOUNTER — Encounter: Payer: Self-pay | Admitting: Family Medicine

## 2016-05-26 DIAGNOSIS — J069 Acute upper respiratory infection, unspecified: Secondary | ICD-10-CM

## 2016-05-26 MED ORDER — AMOXICILLIN-POT CLAVULANATE 875-125 MG PO TABS: 1.0000 | ORAL_TABLET | Freq: Two times a day (BID) | ORAL | 0 refills | Status: DC

## 2016-05-26 NOTE — Progress Notes (Signed)
  Lorraine AlarEric Sonnenberg, MD Phone: [phone]  Lorraine IrishJanet Louise Electa Foley is a 75 y.o. female who presents today for same-day visit.  Upper respiratory infection: Patient notes starting 4 days ago she developed sore throat followed by rhinorrhea and clear mucus out of her nose and sinus congestion and are frontal sinuses. Notes coughing up some yellow mucus minimally. No documented fevers though did feel warm yesterday. No ear discomfort. No shortness of breath. No wheezing. No she has been stable over the last 2 days. No worsening. Has been taking Tylenol Cold and sinus. Also Robitussin.  ROS see history of present illness  Objective  Physical Exam Vitals:   05/26/16 1422  BP: 124/66  Pulse: (!) 53  Temp: 98.5 F (36.9 C)    BP Readings from Last 3 Encounters:  05/26/16 124/66  02/02/16 124/70  10/18/15 (!) 146/68   Wt Readings from Last 3 Encounters:  05/26/16 207 lb (93.9 kg)  02/02/16 207 lb (93.9 kg)  10/15/15 211 lb 12.8 oz (96.1 kg)    Physical Exam  Constitutional: She is well-developed, well-nourished, and in no distress.  HENT:  Head: Normocephalic and atraumatic.  Mild posterior oropharyngeal erythema, no exudate, normal TMs bilaterally  Eyes: Conjunctivae are normal. Pupils are equal, round, and reactive to light.  Neck: Neck supple.  Cardiovascular: Normal rate, regular rhythm and normal heart sounds.   Pulmonary/Chest: Effort normal. No respiratory distress.  Single wheeze on the right side that did not recur with deep breaths in that area, no other wheezing, no crackles  Lymphadenopathy:    She has no cervical adenopathy.  Neurological: She is alert. Gait normal.  Skin: Skin is warm and dry.     Assessment/Plan: Please see individual problem list.  URI (upper respiratory infection) Patient's symptoms most consistent with viral illness. No focal findings on exam to indicate bacterial illness. Likely viral upper respiratory infection. She will continue  over-the-counter Tylenol and Robitussin. I provided her with a prescription for Augmentin to fill on Sunday if she is not improving. If she develops any new symptoms she will be evaluated prior to filling this antibiotic. She is given return precautions.   No orders of the defined types were placed in this encounter.   Meds ordered this encounter  Medications  . amoxicillin-clavulanate (AUGMENTIN) 875-125 MG tablet    Sig: Take 1 tablet by mouth 2 (two) times daily. Do not fill until 05/29/16    Dispense:  14 tablet    Refill:  0   Lorraine AlarEric Sonnenberg, MD [facility] [place]

## 2016-05-26 NOTE — Assessment & Plan Note (Signed)
Patient's symptoms most consistent with viral illness. No focal findings on exam to indicate bacterial illness. Likely viral upper respiratory infection. She will continue over-the-counter Tylenol and Robitussin. I provided her with a prescription for Augmentin to fill on Sunday if she is not improving. If she develops any new symptoms she will be evaluated prior to filling this antibiotic. She is given return precautions.

## 2016-05-26 NOTE — Patient Instructions (Signed)
Nice to meet you. You likely have a viral illness. We treat this supportively with Tylenol and over-the-counter antihistamines such as Claritin. You can continue Robitussin. I've given you a prescription for an antibiotic to fill on Sunday if you are not improved. If you develop shortness of breath, cough productive of blood, fevers, or any new or changing symptoms please seek medical attention.

## 2016-06-03 ENCOUNTER — Encounter: Payer: Self-pay | Admitting: Internal Medicine

## 2016-06-03 ENCOUNTER — Ambulatory Visit (INDEPENDENT_AMBULATORY_CARE_PROVIDER_SITE_OTHER): Payer: 59 | Admitting: Internal Medicine

## 2016-06-03 VITALS — BP 128/70 | HR 59 | Temp 97.9°F | Ht 59.5 in | Wt 207.6 lb

## 2016-06-03 DIAGNOSIS — I10 Essential (primary) hypertension: Secondary | ICD-10-CM

## 2016-06-03 DIAGNOSIS — E119 Type 2 diabetes mellitus without complications: Secondary | ICD-10-CM

## 2016-06-03 DIAGNOSIS — Z0000 Encounter for general adult medical examination without abnormal findings: Secondary | ICD-10-CM | POA: Diagnosis not present

## 2016-06-03 DIAGNOSIS — N289 Disorder of kidney and ureter, unspecified: Secondary | ICD-10-CM

## 2016-06-03 DIAGNOSIS — E7800 Pure hypercholesterolemia, unspecified: Secondary | ICD-10-CM

## 2016-06-03 DIAGNOSIS — E039 Hypothyroidism, unspecified: Secondary | ICD-10-CM | POA: Diagnosis not present

## 2016-06-03 DIAGNOSIS — K219 Gastro-esophageal reflux disease without esophagitis: Secondary | ICD-10-CM

## 2016-06-03 NOTE — Progress Notes (Signed)
Pre visit review using our clinic review tool, if applicable. No additional management support is needed unless otherwise documented below in the visit note. 

## 2016-06-03 NOTE — Progress Notes (Signed)
Patient ID: Lorraine Foley, female   DOB: 1941-07-03, 75 y.o.   MRN: [id number]   Subjective:    Patient ID: Lorraine Foley, female    DOB: 1940-12-10, 75 y.o.   MRN: [id number]  HPI  Patient here for her physical exam.  She is doing well.  Feels good.  Trying to watch her diet.  previously lost weight.  Stable weight recently.  She plans to get back on her routine of watching her diet.  No chest pain.  No sob.  Breathing stable.  No increased cough and congestion.  Previously evaluated.  Treated with otc.  Cough better.  No abdominal pain or cramping.  Bowels stable.     Past Medical History:  Diagnosis Date  . Diabetes mellitus (HCC)   . GERD (gastroesophageal reflux disease)   . Glaucoma   . Hypercholesterolemia   . Hypertension   . Hypothyroidism    Past Surgical History:  Procedure Laterality Date  . EYE SURGERY Bilateral 08/2014, 2.2016   laser surgery in preparation for glaucoma surgery  . TOTAL HIP ARTHROPLASTY  5/13   right   Family History  Problem Relation Age of Onset  . Mental illness Father     suicide  . Liver disease Father     alcohol  . Glaucoma Father   . Alcohol abuse Father   . Blindness Father   . Glaucoma Mother   . Osteoporosis Mother   . Diabetes Sister   . Breast cancer Sister   . Colon cancer Neg Hx    Social History   Social History  . Marital status: Divorced    Spouse name: N/A  . Number of children: 1  . Years of education: N/A   Social History Main Topics  . Smoking status: Never Smoker  . Smokeless tobacco: Never Used  . Alcohol use No  . Drug use: No  . Sexual activity: Not Asked   Other Topics Concern  . None   Social History Narrative  . None    Outpatient Encounter Prescriptions as of 06/03/2016  Medication Sig  . amLODipine (NORVASC) 5 MG tablet Take 1 tablet by mouth  daily  . aspirin 81 MG tablet Take 81 mg by mouth daily.  . brimonidine (ALPHAGAN) 0.2 % ophthalmic solution Place 1 drop into both eyes 2  (two) times daily.  . calcium-vitamin D (CALCIUM 500+D) 500-200 MG-UNIT per tablet Take 1 tablet by mouth daily.  . Cholecalciferol (VITAMIN D-3) 1000 UNITS CAPS Take 1 capsule by mouth daily.  . cloNIDine (CATAPRES) 0.1 MG tablet Take 1 tablet by mouth 3  times a day  . glucosamine-chondroitin 500-400 MG tablet Take 1 tablet by mouth daily.  Marland Kitchen glucose blood (ACCU-CHEK COMPACT STRIPS) test strip Check sugar daily Dx E11.9  . glucose blood test strip accuchek compact test strips Check blood sugar tid  . latanoprost (XALATAN) 0.005 % ophthalmic solution As directed  . LevOCARNitine (L-CARNITINE) 250 MG CAPS Take 1 capsule by mouth daily.  Marland Kitchen levothyroxine (SYNTHROID, LEVOTHROID) 100 MCG tablet Take 1 tablet by mouth  daily  . Multiple Vitamin (MULTIVITAMIN) tablet Take 1 tablet by mouth daily.  Marland Kitchen omega-3 acid ethyl esters (LOVAZA) 1 G capsule Take 2 g by mouth daily.  . pantoprazole (PROTONIX) 40 MG tablet Take 1 tablet by mouth  daily  . pravastatin (PRAVACHOL) 40 MG tablet Take 2 tablets by mouth  daily  . quinapril (ACCUPRIL) 40 MG tablet Take 1 tablet by mouth two  times  daily  . sitaGLIPtin (JANUVIA) 100 MG tablet Take 100 mg by mouth daily.  Marland Kitchen spironolactone-hydrochlorothiazide (ALDACTAZIDE) 25-25 MG tablet Take 1 tablet by mouth  daily  . timolol (TIMOPTIC) 0.5 % ophthalmic solution   . [DISCONTINUED] amoxicillin-clavulanate (AUGMENTIN) 875-125 MG tablet Take 1 tablet by mouth 2 (two) times daily. Do not fill until 05/29/16   No facility-administered encounter medications on file as of 06/03/2016.     Review of Systems  Constitutional: Negative for appetite change and unexpected weight change.  HENT: Negative for congestion and sinus pressure.   Eyes: Negative for pain and visual disturbance.  Respiratory: Negative for cough, chest tightness and shortness of breath.   Cardiovascular: Negative for chest pain, palpitations and leg swelling.  Gastrointestinal: Negative for abdominal  pain, diarrhea, nausea and vomiting.  Genitourinary: Negative for difficulty urinating and dysuria.  Musculoskeletal: Negative for back pain and joint swelling.  Skin: Negative for color change and rash.  Neurological: Negative for dizziness, light-headedness and headaches.  Hematological: Negative for adenopathy. Does not bruise/bleed easily.  Psychiatric/Behavioral: Negative for agitation and dysphoric mood.       Objective:    Physical Exam  Constitutional: She is oriented to person, place, and time. She appears well-developed and well-nourished. No distress.  HENT:  Nose: Nose normal.  Mouth/Throat: Oropharynx is clear and moist.  Eyes: Right eye exhibits no discharge. Left eye exhibits no discharge. No scleral icterus.  Neck: Neck supple. No thyromegaly present.  Cardiovascular: Normal rate and regular rhythm.   Pulmonary/Chest: Breath sounds normal. No accessory muscle usage. No tachypnea. No respiratory distress. She has no decreased breath sounds. She has no wheezes. She has no rhonchi. Right breast exhibits no inverted nipple, no mass, no nipple discharge and no tenderness (no axillary adenopathy). Left breast exhibits no inverted nipple, no mass, no nipple discharge and no tenderness (no axilarry adenopathy).  Abdominal: Soft. Bowel sounds are normal. There is no tenderness.  Musculoskeletal: She exhibits no edema or tenderness.  Lymphadenopathy:    She has no cervical adenopathy.  Neurological: She is alert and oriented to person, place, and time.  Skin: Skin is warm. No rash noted. No erythema.  Psychiatric: She has a normal mood and affect. Her behavior is normal.    BP 128/70   Pulse (!) 59   Temp 97.9 F (36.6 C) (Oral)   Ht 4' 11.5" (1.511 m)   Wt 207 lb 9.6 oz (94.2 kg)   LMP 08/11/1995   SpO2 96%   BMI 41.23 kg/m  Wt Readings from Last 3 Encounters:  06/03/16 207 lb 9.6 oz (94.2 kg)  05/26/16 207 lb (93.9 kg)  02/02/16 207 lb (93.9 kg)     Lab Results    Component Value Date   WBC 6.1 07/27/2015   HGB 14.7 07/22/2014   HCT 42.3 07/27/2015   PLT 256 07/27/2015   GLUCOSE 110 (H) 05/24/2016   CHOL 219 (H) 05/24/2016   TRIG 184 (H) 05/24/2016   HDL 44 05/24/2016   LDLCALC 138 (H) 05/24/2016   ALT 13 05/24/2016   AST 16 05/24/2016   NA 135 05/24/2016   K 5.2 05/24/2016   CL 92 (L) 05/24/2016   CREATININE 1.04 (H) 05/24/2016   BUN 27 05/24/2016   CO2 29 05/24/2016   TSH 1.350 05/24/2016   INR 0.9 12/19/2011   HGBA1C 5.9 (H) 05/24/2016       Assessment & Plan:   Problem List Items Addressed This Visit    Diabetes mellitus (  [place])    Low carb diet and exercise.  Follow met b and a1c.       Relevant Orders   Hemoglobin A1c   Microalbumin / creatinine urine ratio   GERD (gastroesophageal reflux disease)    Controlled on protonix.  Follow.       Health care maintenance    Physical today 06/03/16.  Mammogram 11/27/14 - Birads I.  She will schedule her mammogram.  Colonoscopy 07/08/13.       Hypercholesterolemia    Low cholesterol diet and exercise.  LDL increased from previous check.  Has only been taking '40mg'$  of pravastatin.  Will increase back to her '80mg'$  q day.  Discussed crestor.  She wants to remain on the pravastatin.  Follow lipid panel and liver function tests.        Relevant Orders   Hepatic function panel   Lipid panel   Hypertension    Blood pressure has been doing better.  Continue same medication regimen.  Follow pressures.  Follow metabolic panel.       Relevant Orders   Basic metabolic panel   CBC with Differential/Platelet   Hypothyroidism    On thyroid replacement.  Follow tsh.       Renal insufficiency    Stay hydrated.  Recent creatinine stable.  Follow metabolic panel.       Severe obesity (BMI >= 40) (HCC)    Diet and exercise.  Follow.        Other Visit Diagnoses   None.      Einar Pheasant, MD

## 2016-06-04 ENCOUNTER — Encounter: Payer: Self-pay | Admitting: Internal Medicine

## 2016-06-04 NOTE — Assessment & Plan Note (Signed)
Low cholesterol diet and exercise.  LDL increased from previous check.  Has only been taking 40mg  of pravastatin.  Will increase back to her 80mg  q day.  Discussed crestor.  She wants to remain on the pravastatin.  Follow lipid panel and liver function tests.

## 2016-06-04 NOTE — Assessment & Plan Note (Signed)
Low carb diet and exercise.  Follow met b and a1c.

## 2016-06-04 NOTE — Assessment & Plan Note (Signed)
Blood pressure has been doing better.  Continue same medication regimen.  Follow pressures.  Follow metabolic panel.  

## 2016-06-04 NOTE — Assessment & Plan Note (Signed)
Physical today 06/03/16.  Mammogram 11/27/14 - Birads I.  She will schedule her mammogram.  Colonoscopy 07/08/13.

## 2016-06-04 NOTE — Assessment & Plan Note (Signed)
Controlled on protonix.  Follow.   

## 2016-06-04 NOTE — Assessment & Plan Note (Signed)
On thyroid replacement.  Follow tsh.  

## 2016-06-04 NOTE — Assessment & Plan Note (Signed)
Diet and exercise.  Follow.  

## 2016-06-04 NOTE — Assessment & Plan Note (Signed)
Stay hydrated.  Recent creatinine stable.  Follow metabolic panel.

## 2016-07-30 ENCOUNTER — Other Ambulatory Visit: Payer: Self-pay | Admitting: Internal Medicine

## 2016-08-01 NOTE — Telephone Encounter (Signed)
ok'd refill for clonidine #270 with one refill.

## 2016-08-01 NOTE — Telephone Encounter (Signed)
Last filled 02/09/16 270 1rf

## 2016-09-12 ENCOUNTER — Other Ambulatory Visit: Payer: Self-pay | Admitting: Internal Medicine

## 2016-10-06 LAB — HM DIABETES EYE EXAM

## 2016-10-13 ENCOUNTER — Ambulatory Visit: Payer: 59 | Admitting: Internal Medicine

## 2016-11-04 NOTE — Discharge Instructions (Signed)
Cataract Surgery, Care After °Refer to this sheet in the next few weeks. These instructions provide you with information about caring for yourself after your procedure. Your health care provider may also give you more specific instructions. Your treatment has been planned according to current medical practices, but problems sometimes occur. Call your health care provider if you have any problems or questions after your procedure. °What can I expect after the procedure? °After the procedure, it is common to have: °· Itching. °· Discomfort. °· Fluid discharge. °· Sensitivity to light and to touch. °· Bruising. °Follow these instructions at home: °Eye Care  °· Check your eye every day for signs of infection. Watch for: °¨ Redness, swelling, or pain. °¨ Fluid, blood, or pus. °¨ Warmth. °¨ Bad smell. °Activity  °· Avoid strenuous activities, such as playing contact sports, for as long as told by your health care provider. °· Do not drive or operate heavy machinery until your health care provider approves. °· Do not bend or lift heavy objects . Bending increases pressure in the eye. You can walk, climb stairs, and do light household chores. °· Ask your health care provider when you can return to work. If you work in a dusty environment, you may be advised to wear protective eyewear for a period of time. °General instructions  °· Take or apply over-the-counter and prescription medicines only as told by your health care provider. This includes eye drops. °· Do not touch or rub your eyes. °· If you were given a protective shield, wear it as told by your health care provider. If you were not given a protective shield, wear sunglasses as told by your health care provider to protect your eyes. °· Keep the area around your eye clean and dry. Avoid swimming or allowing water to hit you directly in the face while showering until told by your health care provider. Keep soap and shampoo out of your eyes. °· Do not put a contact lens  into the affected eye or eyes until your health care provider approves. °· Keep all follow-up visits as told by your health care provider. This is important. °Contact a health care provider if: ° °· You have increased bruising around your eye. °· You have pain that is not helped with medicine. °· You have a fever. °· You have redness, swelling, or pain in your eye. °· You have fluid, blood, or pus coming from your incision. °· Your vision gets worse. °Get help right away if: °· You have sudden vision loss. °This information is not intended to replace advice given to you by your health care provider. Make sure you discuss any questions you have with your health care provider. °Document Released: 03/04/2005 Document Revised: 12/24/2015 Document Reviewed: 06/25/2015 °Elsevier Interactive Patient Education © 2017 [employer] ° ° ° ° °General Anesthesia, Adult, Care After °These instructions provide you with information about caring for yourself after your procedure. Your health care provider may also give you more specific instructions. Your treatment has been planned according to current medical practices, but problems sometimes occur. Call your health care provider if you have any problems or questions after your procedure. °What can I expect after the procedure? °After the procedure, it is common to have: °· Vomiting. °· A sore throat. °· Mental slowness. °It is common to feel: °· Nauseous. °· Cold or shivery. °· Sleepy. °· Tired. °· Sore or achy, even in parts of your body where you did not have surgery. °Follow these instructions at   home: °For at least 24 hours after the procedure:  °· Do not: °¨ Participate in activities where you could fall or become injured. °¨ Drive. °¨ Use heavy machinery. °¨ Drink alcohol. °¨ Take sleeping pills or medicines that cause drowsiness. °¨ Make important decisions or sign legal documents. °¨ Take care of children on your own. °· Rest. °Eating and drinking  °· If you vomit, drink  water, juice, or soup when you can drink without vomiting. °· Drink enough fluid to keep your urine clear or pale yellow. °· Make sure you have little or no nausea before eating solid foods. °· Follow the diet recommended by your health care provider. °General instructions  °· Have a responsible adult stay with you until you are awake and alert. °· Return to your normal activities as told by your health care provider. Ask your health care provider what activities are safe for you. °· Take over-the-counter and prescription medicines only as told by your health care provider. °· If you smoke, do not smoke without supervision. °· Keep all follow-up visits as told by your health care provider. This is important. °Contact a health care provider if: °· You continue to have nausea or vomiting at home, and medicines are not helpful. °· You cannot drink fluids or start eating again. °· You cannot urinate after 8-12 hours. °· You develop a skin rash. °· You have fever. °· You have increasing redness at the site of your procedure. °Get help right away if: °· You have difficulty breathing. °· You have chest pain. °· You have unexpected bleeding. °· You feel that you are having a life-threatening or urgent problem. °This information is not intended to replace advice given to you by your health care provider. Make sure you discuss any questions you have with your health care provider. °Document Released: 11/21/2000 Document Revised: 01/18/2016 Document Reviewed: 07/30/2015 °Elsevier Interactive Patient Education © 2017 [employer] ° °

## 2016-11-07 ENCOUNTER — Encounter: Payer: Self-pay | Admitting: *Deleted

## 2016-11-09 ENCOUNTER — Encounter
Admission: RE | Disposition: A | Discharge status: Home / Self Care | Payer: Self-pay | Source: Ambulatory Visit | Attending: Ophthalmology

## 2016-11-09 ENCOUNTER — Ambulatory Visit: Payer: 59 | Admitting: Anesthesiology

## 2016-11-09 ENCOUNTER — Ambulatory Visit
Admission: RE | Admit: 2016-11-09 | Discharge: 2016-11-09 | Disposition: A | Discharge status: Home / Self Care | Payer: 59 | Source: Ambulatory Visit | Attending: Ophthalmology | Admitting: Ophthalmology

## 2016-11-09 DIAGNOSIS — N289 Disorder of kidney and ureter, unspecified: Secondary | ICD-10-CM | POA: Insufficient documentation

## 2016-11-09 DIAGNOSIS — K219 Gastro-esophageal reflux disease without esophagitis: Secondary | ICD-10-CM | POA: Insufficient documentation

## 2016-11-09 DIAGNOSIS — H5703 Miosis: Secondary | ICD-10-CM | POA: Insufficient documentation

## 2016-11-09 DIAGNOSIS — E1136 Type 2 diabetes mellitus with diabetic cataract: Secondary | ICD-10-CM | POA: Insufficient documentation

## 2016-11-09 DIAGNOSIS — I10 Essential (primary) hypertension: Secondary | ICD-10-CM | POA: Insufficient documentation

## 2016-11-09 HISTORY — PX: CATARACT EXTRACTION W/PHACO: SHX586

## 2016-11-09 HISTORY — DX: Unspecified osteoarthritis, unspecified site: M19.90

## 2016-11-09 LAB — GLUCOSE, CAPILLARY
GLUCOSE-CAPILLARY: 111 mg/dL — AB (ref 65–99)
GLUCOSE-CAPILLARY: 117 mg/dL — AB (ref 65–99)

## 2016-11-09 SURGERY — PHACOEMULSIFICATION, CATARACT, WITH IOL INSERTION
Anesthesia: Monitor Anesthesia Care | Site: Eye | Laterality: Left | Wound class: Clean

## 2016-11-09 MED ORDER — MOXIFLOXACIN HCL 0.5 % OP SOLN
1.0000 [drp] | OPHTHALMIC | Status: DC | PRN
  Administered 2016-11-09 (×2): 1 [drp] via OPHTHALMIC

## 2016-11-09 MED ORDER — LACTATED RINGERS IV SOLN: INTRAVENOUS | Status: DC

## 2016-11-09 MED ORDER — CEFUROXIME OPHTHALMIC INJECTION 1 MG/0.1 ML
INJECTION | OPHTHALMIC | Status: DC | PRN
  Administered 2016-11-09: .3 mL via INTRACAMERAL

## 2016-11-09 MED ORDER — NA HYALUR & NA CHOND-NA HYALUR 0.4-0.35 ML IO KIT
PACK | INTRAOCULAR | Status: DC | PRN
  Administered 2016-11-09: 1 mL via INTRAOCULAR

## 2016-11-09 MED ORDER — BRIMONIDINE TARTRATE-TIMOLOL 0.2-0.5 % OP SOLN
OPHTHALMIC | Status: DC | PRN
  Administered 2016-11-09: 1 [drp] via OPHTHALMIC

## 2016-11-09 MED ORDER — MIDAZOLAM HCL 2 MG/2ML IJ SOLN
INTRAMUSCULAR | Status: DC | PRN
  Administered 2016-11-09: 2 mg via INTRAVENOUS

## 2016-11-09 MED ORDER — LIDOCAINE HCL (PF) 2 % IJ SOLN
INTRAOCULAR | Status: DC | PRN
  Administered 2016-11-09: 1 mL via INTRAOCULAR

## 2016-11-09 MED ORDER — ARMC OPHTHALMIC DILATING DROPS
1.0000 "application " | OPHTHALMIC | Status: DC | PRN
  Administered 2016-11-09 (×2): 1 via OPHTHALMIC

## 2016-11-09 MED ORDER — ACETAMINOPHEN 325 MG PO TABS: 325.0000 mg | ORAL_TABLET | ORAL | Status: DC | PRN

## 2016-11-09 MED ORDER — CARBACHOL 0.01 % IO SOLN
INTRAOCULAR | Status: DC | PRN
  Administered 2016-11-09: 2 mL via INTRAOCULAR

## 2016-11-09 MED ORDER — ACETAMINOPHEN 160 MG/5ML PO SOLN: 325.0000 mg | ORAL | Status: DC | PRN

## 2016-11-09 MED ORDER — GLYCOPYRROLATE 0.2 MG/ML IJ SOLN
INTRAMUSCULAR | Status: DC | PRN
  Administered 2016-11-09: 0.2 mg via INTRAVENOUS

## 2016-11-09 MED ORDER — FENTANYL CITRATE (PF) 100 MCG/2ML IJ SOLN
INTRAMUSCULAR | Status: DC | PRN
  Administered 2016-11-09: 50 ug via INTRAVENOUS

## 2016-11-09 MED ORDER — EPINEPHRINE PF 1 MG/ML IJ SOLN
INTRAMUSCULAR | Status: DC | PRN
  Administered 2016-11-09: 65 mL via OPHTHALMIC

## 2016-11-09 SURGICAL SUPPLY — 25 items
CANNULA ANT/CHMB 27GA (MISCELLANEOUS) ×3 IMPLANT
CARTRIDGE ABBOTT (MISCELLANEOUS) IMPLANT
GLOVE SURG LX 7.5 STRW (GLOVE) ×2
GLOVE SURG LX STRL 7.5 STRW (GLOVE) ×1 IMPLANT
GLOVE SURG TRIUMPH 8.0 PF LTX (GLOVE) ×3 IMPLANT
GOWN STRL REUS W/ TWL LRG LVL3 (GOWN DISPOSABLE) ×2 IMPLANT
GOWN STRL REUS W/TWL LRG LVL3 (GOWN DISPOSABLE) ×4
LENS IOL TECNIS ITEC 21.5 (Intraocular Lens) ×3 IMPLANT
MARKER SKIN DUAL TIP RULER LAB (MISCELLANEOUS) ×3 IMPLANT
NDL RETROBULBAR .5 NSTRL (NEEDLE) IMPLANT
NEEDLE FILTER BLUNT 18X 1/2SAF (NEEDLE) ×2
NEEDLE FILTER BLUNT 18X1 1/2 (NEEDLE) ×1 IMPLANT
PACK CATARACT BRASINGTON (MISCELLANEOUS) ×3 IMPLANT
PACK EYE AFTER SURG (MISCELLANEOUS) ×3 IMPLANT
PACK OPTHALMIC (MISCELLANEOUS) ×3 IMPLANT
RING MALYGIN 7.0 (MISCELLANEOUS) ×3 IMPLANT
SUT ETHILON 10-0 CS-B-6CS-B-6 (SUTURE)
SUT VICRYL  9 0 (SUTURE)
SUT VICRYL 9 0 (SUTURE) IMPLANT
SUTURE EHLN 10-0 CS-B-6CS-B-6 (SUTURE) IMPLANT
SYR 3ML LL SCALE MARK (SYRINGE) ×3 IMPLANT
SYR 5ML LL (SYRINGE) ×3 IMPLANT
SYR TB 1ML LUER SLIP (SYRINGE) ×3 IMPLANT
WATER STERILE IRR 250ML POUR (IV SOLUTION) ×3 IMPLANT
WIPE NON LINTING 3.25X3.25 (MISCELLANEOUS) ×3 IMPLANT

## 2016-11-09 NOTE — Op Note (Signed)
OPERATIVE NOTE  Lorraine BushmanJanet Louise Foley [id number] 11/09/2016  PREOPERATIVE DIAGNOSIS:   Nuclear sclerotic cataract left eye with miotic pupil      H25.12   POSTOPERATIVE DIAGNOSIS:   Nuclear sclerotic cataract left eye with miotic pupil.     PROCEDURE:  Phacoemulsification with posterior chamber intraocular lens implantation of the left eye which required pupil stretching with the Malyugin pupil expansion device   LENS:   Implant Name Type Inv. Item Serial No. Manufacturer Lot No. LRB No. Used  LENS IOL DIOP 21.5 - [id number] Intraocular Lens LENS IOL DIOP 21.5 [id number]   Left 1        ULTRASOUND TIME: 19 % of 1 minutes, 7 seconds.  CDE 12.8   SURGEON:  Deirdre Evenerhadwick R. Yordi Krager, MD   ANESTHESIA: Topical with tetracaine drops and 2% Xylocaine jelly, augmented with 1% preservative-free intracameral lidocaine.   COMPLICATIONS:  None.   DESCRIPTION OF PROCEDURE:  The patient was identified in the holding room and transported to the operating room and placed in the supine position under the operating microscope.  The left eye was identified as the operative eye and it was prepped and draped in the usual sterile ophthalmic fashion.   A 1 millimeter clear-corneal paracentesis was made at the 1:30 position.  The anterior chamber was filled with Viscoat viscoelastic.  0.5 ml of preservative-free 1% lidocaine was injected into the anterior chamber.  A 2.4 millimeter keratome was used to make a near-clear corneal incision at the 10:30 position.  A Malyugin pupil expander was then placed through the main incision and into the anterior chamber of the eye.  The edge of the iris was secured on the lip of the pupil expander and it was released, thereby expanding the pupil to approximately 7 millimeters for completion of the cataract surgery.  Additional Viscoat was placed in the anterior chamber.  A cystotome and capsulorrhexis forceps were used to make a curvilinear capsulorrhexis.   Balanced salt  solution was used to hydrodissect and hydrodelineate the lens nucleus.   Phacoemulsification was used in stop and chop fashion to remove the lens, nucleus and epinucleus.  The remaining cortex was aspirated using the irrigation aspiration handpiece.  Additional Provisc was placed into the eye to distend the capsular bag for lens placement.  A lens was then injected into the capsular bag.  The pupil expanding ring was removed using a Kuglen hook and insertion device. The remaining viscoelastic was aspirated from the capsular bag and the anterior chamber.  The anterior chamber was filled with balanced salt solution to inflate to a physiologic pressure.   Wounds were hydrated with balanced salt solution.  The anterior chamber was inflated to a physiologic pressure with balanced salt solution.  No wound leaks were noted. Cefuroxime 0.1 ml of a 10mg /ml solution was injected into the anterior chamber for a dose of 1 mg of intracameral antibiotic at the completion of the case.   Timolol and Brimonidine drops were applied to the eye.  The patient was taken to the recovery room in stable condition without complications of anesthesia or surgery.  [name] 11/09/2016, 9:07 AM

## 2016-11-09 NOTE — Transfer of Care (Signed)
Immediate Anesthesia Transfer of Care Note  Patient: Lorraine Foley  Procedure(s) Performed: Procedure(s) with comments: CATARACT EXTRACTION PHACO AND INTRAOCULAR LENS PLACEMENT ([facility])  left diabetic complicated (Left) - Diabetic - oral meds  Patient Location: PACU  Anesthesia Type: MAC  Level of Consciousness: awake, alert  and patient cooperative  Airway and Oxygen Therapy: Patient Spontanous Breathing and Patient connected to supplemental oxygen  Post-op Assessment: Post-op Vital signs reviewed, Patient's Cardiovascular Status Stable, Respiratory Function Stable, Patent Airway and No signs of Nausea or vomiting  Post-op Vital Signs: Reviewed and stable  Complications: No apparent anesthesia complications

## 2016-11-09 NOTE — Anesthesia Procedure Notes (Signed)
Procedure Name: MAC Date/Time: 11/09/2016 8:48 AM Performed by: Janna Arch Pre-anesthesia Checklist: Patient identified, Emergency Drugs available, Suction available and Patient being monitored Patient Re-evaluated:Patient Re-evaluated prior to inductionOxygen Delivery Method: Nasal cannula

## 2016-11-09 NOTE — H&P (Signed)
The History and Physical notes are on paper, have been signed, and are to be scanned. The patient remains stable and unchanged from the H&P.   Previous H&P reviewed, patient examined, and there are no changes.  Lorraine Foley 11/09/2016 8:35 AM   

## 2016-11-09 NOTE — Anesthesia Postprocedure Evaluation (Signed)
Anesthesia Post Note  Patient: Lorraine Foley  Procedure(s) Performed: Procedure(s) (LRB): CATARACT EXTRACTION PHACO AND INTRAOCULAR LENS PLACEMENT ([place])  left diabetic complicated (Left)  Patient location during evaluation: PACU Anesthesia Type: MAC Level of consciousness: awake and alert and oriented Pain management: satisfactory to patient Vital Signs Assessment: post-procedure vital signs reviewed and stable Respiratory status: spontaneous breathing, nonlabored ventilation and respiratory function stable Cardiovascular status: blood pressure returned to baseline and stable Postop Assessment: Adequate PO intake and No signs of nausea or vomiting Anesthetic complications: no    Raliegh Ip

## 2016-11-09 NOTE — Anesthesia Preprocedure Evaluation (Signed)
Anesthesia Evaluation  Patient identified by MRN, date of birth, ID band Patient awake    Reviewed: Allergy & Precautions, H&P , NPO status , Patient's Chart, lab work & pertinent test results  Airway Mallampati: II  TM Distance: >3 FB Neck ROM: full    Dental no notable dental hx.    Pulmonary    Pulmonary exam normal        Cardiovascular hypertension, Normal cardiovascular exam     Neuro/Psych    GI/Hepatic GERD  ,  Endo/Other  diabetesHypothyroidism   Renal/GU Renal disease     Musculoskeletal   Abdominal   Peds  Hematology   Anesthesia Other Findings   Reproductive/Obstetrics                             Anesthesia Physical Anesthesia Plan  ASA: II  Anesthesia Plan: MAC   Post-op Pain Management:    Induction:   Airway Management Planned:   Additional Equipment:   Intra-op Plan:   Post-operative Plan:   Informed Consent: I have reviewed the patients History and Physical, chart, labs and discussed the procedure including the risks, benefits and alternatives for the proposed anesthesia with the patient or authorized representative who has indicated his/her understanding and acceptance.     Plan Discussed with:   Anesthesia Plan Comments:         Anesthesia Quick Evaluation

## 2016-11-10 ENCOUNTER — Encounter: Payer: Self-pay | Admitting: Ophthalmology

## 2016-11-11 ENCOUNTER — Ambulatory Visit (INDEPENDENT_AMBULATORY_CARE_PROVIDER_SITE_OTHER): Payer: 59 | Admitting: Internal Medicine

## 2016-11-11 ENCOUNTER — Encounter: Payer: Self-pay | Admitting: Internal Medicine

## 2016-11-11 VITALS — BP 126/68 | HR 62 | Temp 98.6°F | Resp 16 | Ht 60.0 in | Wt 201.0 lb

## 2016-11-11 DIAGNOSIS — E669 Obesity, unspecified: Secondary | ICD-10-CM

## 2016-11-11 DIAGNOSIS — K219 Gastro-esophageal reflux disease without esophagitis: Secondary | ICD-10-CM

## 2016-11-11 DIAGNOSIS — E039 Hypothyroidism, unspecified: Secondary | ICD-10-CM

## 2016-11-11 DIAGNOSIS — I10 Essential (primary) hypertension: Secondary | ICD-10-CM

## 2016-11-11 DIAGNOSIS — E119 Type 2 diabetes mellitus without complications: Secondary | ICD-10-CM

## 2016-11-11 DIAGNOSIS — N289 Disorder of kidney and ureter, unspecified: Secondary | ICD-10-CM

## 2016-11-11 DIAGNOSIS — Z126 Encounter for screening for malignant neoplasm of bladder: Secondary | ICD-10-CM | POA: Diagnosis not present

## 2016-11-11 DIAGNOSIS — E7800 Pure hypercholesterolemia, unspecified: Secondary | ICD-10-CM

## 2016-11-11 NOTE — Progress Notes (Signed)
Patient ID: Lorraine Foley, female   DOB: 1941-02-07, 76 y.o.   MRN: [id number]   Subjective:    Patient ID: Lorraine Foley, female    DOB: Jan 29, 1941, 76 y.o.   MRN: [id number]  HPI  Patient here for a scheduled follow up.  She reports she is doing well.  Feels better.  Has been watching her diet.  Has lost some weight.  Blood pressures averaging 130-140s systolic readings.  No chest pain.  No sob.  No acid reflux.  No abdominal pain.  Bowels moving.  Has more energy.     Past Medical History:  Diagnosis Date  . Arthritis    knees  . Diabetes mellitus (HCC)   . GERD (gastroesophageal reflux disease)   . Glaucoma   . Hypercholesterolemia   . Hypertension   . Hypothyroidism    Past Surgical History:  Procedure Laterality Date  . CATARACT EXTRACTION W/PHACO Left 11/09/2016   Procedure: CATARACT EXTRACTION PHACO AND INTRAOCULAR LENS PLACEMENT (IOC)  left diabetic complicated;  Surgeon: Lockie Mola, MD;  Location: [facility];  Service: Ophthalmology;  Laterality: Left;  Diabetic - oral meds  . EYE SURGERY Bilateral 08/2014, 2.2016   laser surgery in preparation for glaucoma surgery  . TOTAL HIP ARTHROPLASTY  5/13   right   Family History  Problem Relation Age of Onset  . Mental illness Father     suicide  . Liver disease Father     alcohol  . Glaucoma Father   . Alcohol abuse Father   . Blindness Father   . Glaucoma Mother   . Osteoporosis Mother   . Diabetes Sister   . Breast cancer Sister   . Colon cancer Neg Hx    Social History   Social History  . Marital status: Divorced    Spouse name: N/A  . Number of children: 1  . Years of education: N/A   Social History Main Topics  . Smoking status: Never Smoker  . Smokeless tobacco: Never Used  . Alcohol use No  . Drug use: No  . Sexual activity: Not Asked   Other Topics Concern  . None   Social History Narrative  . None    Outpatient Encounter Prescriptions as of 11/11/2016  Medication  Sig  . amLODipine (NORVASC) 5 MG tablet TAKE 1 TABLET BY MOUTH  DAILY  . aspirin 81 MG tablet Take 81 mg by mouth daily.  . calcium-vitamin D (CALCIUM 500+D) 500-200 MG-UNIT per tablet Take 1 tablet by mouth daily.  . Cholecalciferol (VITAMIN D-3) 1000 UNITS CAPS Take 1 capsule by mouth daily.  . cloNIDine (CATAPRES) 0.1 MG tablet TAKE 1 TABLET BY MOUTH 3  TIMES A DAY  . glucosamine-chondroitin 500-400 MG tablet Take 1 tablet by mouth daily.  Marland Kitchen glucose blood (ACCU-CHEK COMPACT STRIPS) test strip Check sugar daily Dx E11.9  . glucose blood test strip accuchek compact test strips Check blood sugar tid  . latanoprost (XALATAN) 0.005 % ophthalmic solution As directed  . LevOCARNitine (L-CARNITINE) 250 MG CAPS Take 1 capsule by mouth daily.  Marland Kitchen levothyroxine (SYNTHROID, LEVOTHROID) 100 MCG tablet TAKE 1 TABLET BY MOUTH  DAILY  . Multiple Vitamin (MULTIVITAMIN) tablet Take 1 tablet by mouth daily.  Marland Kitchen omega-3 acid ethyl esters (LOVAZA) 1 G capsule Take 2 g by mouth daily.  . pantoprazole (PROTONIX) 40 MG tablet TAKE 1 TABLET BY MOUTH  DAILY  . pravastatin (PRAVACHOL) 40 MG tablet TAKE 2 TABLETS BY MOUTH  DAILY  .  quinapril (ACCUPRIL) 40 MG tablet TAKE 1 TABLET BY MOUTH TWO  TIMES DAILY  . spironolactone-hydrochlorothiazide (ALDACTAZIDE) 25-25 MG tablet TAKE 1 TABLET BY MOUTH  DAILY  . timolol (TIMOPTIC) 0.5 % ophthalmic solution    No facility-administered encounter medications on file as of 11/11/2016.     Review of Systems  Constitutional: Negative for appetite change and unexpected weight change.  HENT: Negative for congestion and sinus pressure.   Respiratory: Negative for cough, chest tightness and shortness of breath.   Cardiovascular: Negative for chest pain, palpitations and leg swelling.  Gastrointestinal: Negative for abdominal pain, diarrhea, nausea and vomiting.  Genitourinary: Negative for difficulty urinating and dysuria.  Musculoskeletal: Negative for back pain and joint  swelling.  Skin: Negative for color change and rash.  Neurological: Negative for dizziness, light-headedness and headaches.  Psychiatric/Behavioral: Negative for agitation and dysphoric mood.       Objective:     Blood pressure rechecked by me:  132/72  Physical Exam  Constitutional: She appears well-developed and well-nourished. No distress.  HENT:  Nose: Nose normal.  Mouth/Throat: Oropharynx is clear and moist.  Neck: Neck supple. No thyromegaly present.  Cardiovascular: Normal rate and regular rhythm.   Pulmonary/Chest: Breath sounds normal. No respiratory distress. She has no wheezes.  Abdominal: Soft. Bowel sounds are normal. There is no tenderness.  Musculoskeletal: She exhibits no edema or tenderness.  Lymphadenopathy:    She has no cervical adenopathy.  Skin: No rash noted. No erythema.  Psychiatric: She has a normal mood and affect. Her behavior is normal.    BP 126/68 (BP Location: Left Arm, Patient Position: Sitting, Cuff Size: Normal)   Pulse 62   Temp 98.6 F (37 C) (Oral)   Resp 16   Ht 5' (1.524 m)   Wt 201 lb (91.2 kg)   LMP 08/11/1995   SpO2 95%   BMI 39.26 kg/m  Wt Readings from Last 3 Encounters:  11/11/16 201 lb (91.2 kg)  11/09/16 200 lb (90.7 kg)  06/03/16 207 lb 9.6 oz (94.2 kg)     Lab Results  Component Value Date   WBC 6.1 07/27/2015   HGB 14.7 07/22/2014   HCT 42.3 07/27/2015   PLT 256 07/27/2015   GLUCOSE 110 (H) 05/24/2016   CHOL 219 (H) 05/24/2016   TRIG 184 (H) 05/24/2016   HDL 44 05/24/2016   LDLCALC 138 (H) 05/24/2016   ALT 13 05/24/2016   AST 16 05/24/2016   NA 135 05/24/2016   K 5.2 05/24/2016   CL 92 (L) 05/24/2016   CREATININE 1.04 (H) 05/24/2016   BUN 27 05/24/2016   CO2 29 05/24/2016   TSH 1.350 05/24/2016   INR 0.9 12/19/2011   HGBA1C 5.9 (H) 05/24/2016       Assessment & Plan:   Problem List Items Addressed This Visit    Diabetes mellitus ([place])    Low carb diet and exercise.  She has adjusted her diet.   Lost weight.  Follow met b and a1c.        GERD (gastroesophageal reflux disease)    Controlled on current regimen.  Follow.        Hypercholesterolemia    Low cholesterol diet and exercise.  Follow lipid panel and liver function tests.  On pravastatin.        Hypertension    Blood pressure under good control.  Continue same medication regimen.  Follow pressures.  Follow metabolic panel.        Hypothyroidism  On thyroid replacement.  Follow tsh.       Obesity (BMI 35.0-39.9 without comorbidity)    Continue diet and exercise.        Renal insufficiency    Creatinine 1.04 last check.  Stay hydrated.  Follow.        Other Visit Diagnoses    Screening for bladder cancer    -  Primary   Relevant Orders   MM DIGITAL SCREENING BILATERAL       Einar Pheasant, MD

## 2016-11-11 NOTE — Progress Notes (Signed)
Pre-visit discussion using our clinic review tool. No additional management support is needed unless otherwise documented below in the visit note.  

## 2016-11-13 ENCOUNTER — Encounter: Payer: Self-pay | Admitting: Internal Medicine

## 2016-11-13 NOTE — Assessment & Plan Note (Signed)
Low carb diet and exercise.  She has adjusted her diet.  Lost weight.  Follow met b and a1c.

## 2016-11-13 NOTE — Assessment & Plan Note (Signed)
Controlled on current regimen.  Follow.  

## 2016-11-13 NOTE — Assessment & Plan Note (Signed)
On thyroid replacement.  Follow tsh.  

## 2016-11-13 NOTE — Assessment & Plan Note (Signed)
Creatinine 1.04 last check.  Stay hydrated.  Follow.

## 2016-11-13 NOTE — Assessment & Plan Note (Signed)
Low cholesterol diet and exercise.  Follow lipid panel and liver function tests.  On pravastatin.   

## 2016-11-13 NOTE — Assessment & Plan Note (Signed)
Blood pressure under good control.  Continue same medication regimen.  Follow pressures.  Follow metabolic panel.   

## 2016-11-13 NOTE — Assessment & Plan Note (Signed)
Continue diet and exercise. 

## 2016-11-18 ENCOUNTER — Telehealth: Payer: Self-pay | Admitting: Internal Medicine

## 2016-11-18 NOTE — Telephone Encounter (Signed)
Pt called back returning your call. Thank you!  Call pt @ [phone]

## 2016-11-18 NOTE — Telephone Encounter (Signed)
Patient advised of appointment scheduled for mammogram 12/15/16 @ 4:20 pm [facility].  Patient verbalized an understanding.

## 2016-11-19 LAB — CBC WITH DIFFERENTIAL/PLATELET
BASOS: 1 %
Basophils Absolute: 0 10*3/uL (ref 0.0–0.2)
EOS (ABSOLUTE): 0.3 10*3/uL (ref 0.0–0.4)
EOS: 4 %
HEMATOCRIT: 39.4 % (ref 34.0–46.6)
HEMOGLOBIN: 13.7 g/dL (ref 11.1–15.9)
IMMATURE GRANS (ABS): 0 10*3/uL (ref 0.0–0.1)
IMMATURE GRANULOCYTES: 0 %
LYMPHS: 24 %
Lymphocytes Absolute: 1.5 10*3/uL (ref 0.7–3.1)
MCH: 30.1 pg (ref 26.6–33.0)
MCHC: 34.8 g/dL (ref 31.5–35.7)
MCV: 87 fL (ref 79–97)
MONOCYTES: 10 %
Monocytes Absolute: 0.6 10*3/uL (ref 0.1–0.9)
Neutrophils Absolute: 3.7 10*3/uL (ref 1.4–7.0)
Neutrophils: 61 %
PLATELETS: 240 10*3/uL (ref 150–379)
RBC: 4.55 x10E6/uL (ref 3.77–5.28)
RDW: 12.8 % (ref 12.3–15.4)
WBC: 6 10*3/uL (ref 3.4–10.8)

## 2016-11-19 LAB — HEMOGLOBIN A1C
Est. average glucose Bld gHb Est-mCnc: 131 mg/dL
Hgb A1c MFr Bld: 6.2 % — ABNORMAL HIGH (ref 4.8–5.6)

## 2016-11-19 LAB — LIPID PANEL
Chol/HDL Ratio: 4 ratio units (ref 0.0–4.4)
Cholesterol, Total: 173 mg/dL (ref 100–199)
HDL: 43 mg/dL (ref >39)
LDL Calculated: 107 mg/dL — ABNORMAL HIGH (ref 0–99)
Triglycerides: 114 mg/dL (ref 0–149)
VLDL CHOLESTEROL CAL: 23 mg/dL (ref 5–40)

## 2016-11-19 LAB — HEPATIC FUNCTION PANEL
ALBUMIN: 4.2 g/dL (ref 3.5–4.8)
ALK PHOS: 55 IU/L (ref 39–117)
ALT: 11 IU/L (ref 0–32)
AST: 10 IU/L (ref 0–40)
BILIRUBIN TOTAL: 0.6 mg/dL (ref 0.0–1.2)
Bilirubin, Direct: 0.13 mg/dL (ref 0.00–0.40)
TOTAL PROTEIN: 6.8 g/dL (ref 6.0–8.5)

## 2016-11-19 LAB — BASIC METABOLIC PANEL
BUN / CREAT RATIO: 32 — AB (ref 12–28)
BUN: 32 mg/dL — ABNORMAL HIGH (ref 8–27)
CALCIUM: 9.4 mg/dL (ref 8.7–10.3)
CHLORIDE: 95 mmol/L — AB (ref 96–106)
CO2: 29 mmol/L (ref 18–29)
Creatinine, Ser: 1.01 mg/dL — ABNORMAL HIGH (ref 0.57–1.00)
GFR, EST AFRICAN AMERICAN: 63 mL/min/{1.73_m2} (ref >59)
GFR, EST NON AFRICAN AMERICAN: 55 mL/min/{1.73_m2} — AB (ref >59)
Glucose: 108 mg/dL — ABNORMAL HIGH (ref 65–99)
POTASSIUM: 5.3 mmol/L — AB (ref 3.5–5.2)
SODIUM: 136 mmol/L (ref 134–144)

## 2016-11-19 LAB — MICROALBUMIN / CREATININE URINE RATIO
Creatinine, Urine: 18.4 mg/dL
Microalb/Creat Ratio: <16.3 mg/g creat (ref 0.0–30.0)
Microalbumin, Urine: <3 ug/mL

## 2016-11-21 ENCOUNTER — Telehealth: Payer: Self-pay | Admitting: Internal Medicine

## 2016-11-21 ENCOUNTER — Telehealth: Payer: Self-pay

## 2016-11-21 DIAGNOSIS — E875 Hyperkalemia: Secondary | ICD-10-CM

## 2016-11-21 NOTE — Telephone Encounter (Signed)
-----   Message from Dale Durhamharlene Scott, MD sent at 11/21/2016  5:41 AM EDT ----- Notify pt that her kidney function is stable (actually improved).  Potassium just slightly increased.  Has varied previously, but is overall relatively stable.  Will need to follow.  Overall sugar control looks good.  Cholesterol has improved.  hgb and liver function tests are wnl.  Recheck potassium in the next 10 days to confirm stable.  Gets labs at [facility]  I will place the order.  Not a fasting lab.  Confirm with pt not taking any potassium supplements.

## 2016-11-21 NOTE — Telephone Encounter (Signed)
Order placed for f/u potassium lab.  

## 2016-11-21 NOTE — Telephone Encounter (Signed)
Left message to return call to our office.  

## 2016-11-22 NOTE — Telephone Encounter (Signed)
Patient returned call all information given. Per patient request copy of labs have been mailed. She will go to outside lab to have done.

## 2016-12-03 LAB — POTASSIUM: POTASSIUM: 4.8 mmol/L (ref 3.5–5.2)

## 2016-12-05 ENCOUNTER — Telehealth: Payer: Self-pay

## 2016-12-05 NOTE — Telephone Encounter (Signed)
Left message to return call to our office.  

## 2016-12-05 NOTE — Telephone Encounter (Signed)
-----   Message from Dale Wildwood Crest, MD sent at 12/03/2016 10:20 AM EDT ----- Notify pt that her potassium is wnl.

## 2016-12-05 NOTE — Telephone Encounter (Signed)
Patient called back informed of results no questions

## 2016-12-12 ENCOUNTER — Other Ambulatory Visit: Payer: Self-pay | Admitting: Internal Medicine

## 2016-12-15 ENCOUNTER — Ambulatory Visit
Admission: RE | Admit: 2016-12-15 | Discharge: 2016-12-15 | Disposition: A | Discharge status: Home / Self Care | Payer: 59 | Source: Ambulatory Visit | Attending: Internal Medicine | Admitting: Internal Medicine

## 2016-12-15 DIAGNOSIS — Z1231 Encounter for screening mammogram for malignant neoplasm of breast: Secondary | ICD-10-CM | POA: Diagnosis present

## 2016-12-15 DIAGNOSIS — Z126 Encounter for screening for malignant neoplasm of bladder: Secondary | ICD-10-CM

## 2017-02-27 ENCOUNTER — Encounter: Payer: Self-pay | Admitting: *Deleted

## 2017-02-28 NOTE — Discharge Instructions (Signed)

## 2017-03-08 ENCOUNTER — Ambulatory Visit: Payer: 59 | Admitting: Anesthesiology

## 2017-03-08 ENCOUNTER — Encounter
Admission: RE | Disposition: A | Discharge status: Home / Self Care | Payer: Self-pay | Source: Ambulatory Visit | Attending: Ophthalmology

## 2017-03-08 ENCOUNTER — Ambulatory Visit
Admission: RE | Admit: 2017-03-08 | Discharge: 2017-03-08 | Disposition: A | Discharge status: Home / Self Care | Payer: 59 | Source: Ambulatory Visit | Attending: Ophthalmology | Admitting: Ophthalmology

## 2017-03-08 DIAGNOSIS — Z888 Allergy status to other drugs, medicaments and biological substances status: Secondary | ICD-10-CM | POA: Diagnosis not present

## 2017-03-08 DIAGNOSIS — Z7982 Long term (current) use of aspirin: Secondary | ICD-10-CM | POA: Diagnosis not present

## 2017-03-08 DIAGNOSIS — Z9889 Other specified postprocedural states: Secondary | ICD-10-CM | POA: Insufficient documentation

## 2017-03-08 DIAGNOSIS — E119 Type 2 diabetes mellitus without complications: Secondary | ICD-10-CM | POA: Diagnosis not present

## 2017-03-08 DIAGNOSIS — E039 Hypothyroidism, unspecified: Secondary | ICD-10-CM | POA: Insufficient documentation

## 2017-03-08 DIAGNOSIS — H42 Glaucoma in diseases classified elsewhere: Secondary | ICD-10-CM | POA: Insufficient documentation

## 2017-03-08 DIAGNOSIS — H2511 Age-related nuclear cataract, right eye: Secondary | ICD-10-CM | POA: Insufficient documentation

## 2017-03-08 DIAGNOSIS — E1139 Type 2 diabetes mellitus with other diabetic ophthalmic complication: Secondary | ICD-10-CM | POA: Diagnosis not present

## 2017-03-08 DIAGNOSIS — M1990 Unspecified osteoarthritis, unspecified site: Secondary | ICD-10-CM | POA: Insufficient documentation

## 2017-03-08 DIAGNOSIS — Z79899 Other long term (current) drug therapy: Secondary | ICD-10-CM | POA: Insufficient documentation

## 2017-03-08 DIAGNOSIS — E7800 Pure hypercholesterolemia, unspecified: Secondary | ICD-10-CM | POA: Insufficient documentation

## 2017-03-08 DIAGNOSIS — I10 Essential (primary) hypertension: Secondary | ICD-10-CM | POA: Insufficient documentation

## 2017-03-08 HISTORY — PX: CATARACT EXTRACTION W/PHACO: SHX586

## 2017-03-08 SURGERY — PHACOEMULSIFICATION, CATARACT, WITH IOL INSERTION
Anesthesia: Monitor Anesthesia Care | Laterality: Right | Wound class: Clean

## 2017-03-08 MED ORDER — NA HYALUR & NA CHOND-NA HYALUR 0.4-0.35 ML IO KIT
PACK | INTRAOCULAR | Status: DC | PRN
  Administered 2017-03-08: 1 mL via INTRAOCULAR

## 2017-03-08 MED ORDER — CEFUROXIME OPHTHALMIC INJECTION 1 MG/0.1 ML
INJECTION | OPHTHALMIC | Status: DC | PRN
  Administered 2017-03-08: 0.1 mL via INTRACAMERAL

## 2017-03-08 MED ORDER — FENTANYL CITRATE (PF) 100 MCG/2ML IJ SOLN
INTRAMUSCULAR | Status: DC | PRN
  Administered 2017-03-08: 50 ug via INTRAVENOUS

## 2017-03-08 MED ORDER — ACETAMINOPHEN 160 MG/5ML PO SOLN: 325.0000 mg | ORAL | Status: DC | PRN

## 2017-03-08 MED ORDER — ACETAMINOPHEN 325 MG PO TABS: 325.0000 mg | ORAL_TABLET | ORAL | Status: DC | PRN

## 2017-03-08 MED ORDER — LACTATED RINGERS IV SOLN: INTRAVENOUS | Status: DC

## 2017-03-08 MED ORDER — MOXIFLOXACIN HCL 0.5 % OP SOLN
1.0000 [drp] | OPHTHALMIC | Status: DC | PRN
  Administered 2017-03-08 (×3): 1 [drp] via OPHTHALMIC

## 2017-03-08 MED ORDER — BRIMONIDINE TARTRATE-TIMOLOL 0.2-0.5 % OP SOLN
OPHTHALMIC | Status: DC | PRN
  Administered 2017-03-08: 1 [drp] via OPHTHALMIC

## 2017-03-08 MED ORDER — ARMC OPHTHALMIC DILATING DROPS
1.0000 "application " | OPHTHALMIC | Status: DC | PRN
  Administered 2017-03-08 (×3): 1 via OPHTHALMIC

## 2017-03-08 MED ORDER — CARBACHOL 0.01 % IO SOLN
INTRAOCULAR | Status: DC | PRN
  Administered 2017-03-08: 0.5 mL via INTRAOCULAR

## 2017-03-08 MED ORDER — MIDAZOLAM HCL 2 MG/2ML IJ SOLN
INTRAMUSCULAR | Status: DC | PRN
  Administered 2017-03-08: 2 mg via INTRAVENOUS

## 2017-03-08 MED ORDER — EPINEPHRINE PF 1 MG/ML IJ SOLN
INTRAOCULAR | Status: DC | PRN
  Administered 2017-03-08: 65 mL via OPHTHALMIC

## 2017-03-08 SURGICAL SUPPLY — 25 items
CANNULA ANT/CHMB 27GA (MISCELLANEOUS) ×3 IMPLANT
CARTRIDGE ABBOTT (MISCELLANEOUS) IMPLANT
GLOVE SURG LX 7.5 STRW (GLOVE) ×2
GLOVE SURG LX STRL 7.5 STRW (GLOVE) ×1 IMPLANT
GLOVE SURG TRIUMPH 8.0 PF LTX (GLOVE) ×3 IMPLANT
GOWN STRL REUS W/ TWL LRG LVL3 (GOWN DISPOSABLE) ×2 IMPLANT
GOWN STRL REUS W/TWL LRG LVL3 (GOWN DISPOSABLE) ×4
LENS IOL TECNIS ITEC 21.5 (Intraocular Lens) ×3 IMPLANT
MARKER SKIN DUAL TIP RULER LAB (MISCELLANEOUS) ×3 IMPLANT
NDL RETROBULBAR .5 NSTRL (NEEDLE) IMPLANT
NEEDLE FILTER BLUNT 18X 1/2SAF (NEEDLE) ×2
NEEDLE FILTER BLUNT 18X1 1/2 (NEEDLE) ×1 IMPLANT
PACK CATARACT BRASINGTON (MISCELLANEOUS) ×3 IMPLANT
PACK EYE AFTER SURG (MISCELLANEOUS) ×3 IMPLANT
PACK OPTHALMIC (MISCELLANEOUS) ×3 IMPLANT
RING MALYGIN 7.0 (MISCELLANEOUS) IMPLANT
SUT ETHILON 10-0 CS-B-6CS-B-6 (SUTURE)
SUT VICRYL  9 0 (SUTURE)
SUT VICRYL 9 0 (SUTURE) IMPLANT
SUTURE EHLN 10-0 CS-B-6CS-B-6 (SUTURE) IMPLANT
SYR 3ML LL SCALE MARK (SYRINGE) ×3 IMPLANT
SYR 5ML LL (SYRINGE) ×3 IMPLANT
SYR TB 1ML LUER SLIP (SYRINGE) ×3 IMPLANT
WATER STERILE IRR 250ML POUR (IV SOLUTION) ×3 IMPLANT
WIPE NON LINTING 3.25X3.25 (MISCELLANEOUS) ×3 IMPLANT

## 2017-03-08 NOTE — Transfer of Care (Signed)
Immediate Anesthesia Transfer of Care Note  Patient: Lorraine Foley  Procedure(s) Performed: Procedure(s): CATARACT EXTRACTION PHACO AND INTRAOCULAR LENS PLACEMENT (IOC)  Right Diabetic complicated (Right)  Patient Location: PACU  Anesthesia Type: MAC  Level of Consciousness: awake, alert  and patient cooperative  Airway and Oxygen Therapy: Patient Spontanous Breathing and Patient connected to supplemental oxygen  Post-op Assessment: Post-op Vital signs reviewed, Patient's Cardiovascular Status Stable, Respiratory Function Stable, Patent Airway and No signs of Nausea or vomiting  Post-op Vital Signs: Reviewed and stable  Complications: No apparent anesthesia complications

## 2017-03-08 NOTE — Op Note (Signed)
LOCATION:  [facility]   PREOPERATIVE DIAGNOSIS:    Nuclear sclerotic cataract right eye. H25.11   POSTOPERATIVE DIAGNOSIS:  Nuclear sclerotic cataract right eye.     PROCEDURE:  Phacoemusification with posterior chamber intraocular lens placement of the right eye   LENS:   Implant Name Type Inv. Item Serial No. Manufacturer Lot No. LRB No. Used  LENS IOL DIOP 21.5 - [id number] Intraocular Lens LENS IOL DIOP 21.5 [id number]   Right 1        ULTRASOUND TIME: 15 % of 1 minutes, 18 seconds.  CDE 11.5   SURGEON:  Deirdre Evenerhadwick R. Aidon Klemens, MD   ANESTHESIA:  Topical with tetracaine drops and 2% Xylocaine jelly, augmented with 1% preservative-free intracameral lidocaine.    COMPLICATIONS:  None.   DESCRIPTION OF PROCEDURE:  The patient was identified in the holding room and transported to the operating room and placed in the supine position under the operating microscope.  The right eye was identified as the operative eye and it was prepped and draped in the usual sterile ophthalmic fashion.   A 1 millimeter clear-corneal paracentesis was made at the 12:00 position.  0.5 ml of preservative-free 1% lidocaine was injected into the anterior chamber. The anterior chamber was filled with Viscoat viscoelastic.  A 2.4 millimeter keratome was used to make a near-clear corneal incision at the 9:00 position.  A curvilinear capsulorrhexis was made with a cystotome and capsulorrhexis forceps.  Balanced salt solution was used to hydrodissect and hydrodelineate the nucleus.   Phacoemulsification was then used in stop and chop fashion to remove the lens nucleus and epinucleus.  The remaining cortex was then removed using the irrigation and aspiration handpiece. Provisc was then placed into the capsular bag to distend it for lens placement.  A lens was then injected into the capsular bag.  The remaining viscoelastic was aspirated.   Wounds were hydrated with balanced salt solution.  The anterior  chamber was inflated to a physiologic pressure with balanced salt solution.  No wound leaks were noted. Miostat was placed into the anterior chamber.  Cefuroxime 0.1 ml of a 10mg /ml solution was injected into the anterior chamber for a dose of 1 mg of intracameral antibiotic at the completion of the case.    Timolol and Brimonidine drops were applied to the eye.  The patient was taken to the recovery room in stable condition without complications of anesthesia or surgery.   Kazia Grisanti 03/08/2017, 8:30 AM

## 2017-03-08 NOTE — Anesthesia Preprocedure Evaluation (Signed)
Anesthesia Evaluation  Patient identified by MRN, date of birth, ID band Patient awake    Reviewed: Allergy & Precautions, NPO status , Patient's Chart, lab work & pertinent test results  History of Anesthesia Complications Negative for: history of anesthetic complications  Airway Mallampati: I  TM Distance: >3 FB Neck ROM: Full    Dental no notable dental hx.    Pulmonary neg pulmonary ROS,    Pulmonary exam normal breath sounds clear to auscultation       Cardiovascular Exercise Tolerance: Good hypertension, Normal cardiovascular exam Rhythm:Regular Rate:Normal     Neuro/Psych negative neurological ROS  negative psych ROS   GI/Hepatic GERD  ,  Endo/Other  diabetes, Type 2Hypothyroidism   Renal/GU Renal InsufficiencyRenal disease     Musculoskeletal  (+) Arthritis , Osteoarthritis,    Abdominal   Peds  Hematology negative hematology ROS (+)   Anesthesia Other Findings   Reproductive/Obstetrics                             Anesthesia Physical Anesthesia Plan  ASA: II  Anesthesia Plan: MAC   Post-op Pain Management:    Induction: Intravenous  PONV Risk Score and Plan: 1 and Ondansetron and Propofol  Airway Management Planned:   Additional Equipment:   Intra-op Plan:   Post-operative Plan:   Informed Consent: I have reviewed the patients History and Physical, chart, labs and discussed the procedure including the risks, benefits and alternatives for the proposed anesthesia with the patient or authorized representative who has indicated his/her understanding and acceptance.     Plan Discussed with:   Anesthesia Plan Comments:         Anesthesia Quick Evaluation

## 2017-03-08 NOTE — Anesthesia Postprocedure Evaluation (Signed)
Anesthesia Post Note  Patient: Lorraine Foley  Procedure(s) Performed: Procedure(s) (LRB): CATARACT EXTRACTION PHACO AND INTRAOCULAR LENS PLACEMENT (IOC)  Right Diabetic complicated (Right)  Patient location during evaluation: PACU Anesthesia Type: MAC Level of consciousness: awake and alert, oriented and patient cooperative Pain management: pain level controlled Vital Signs Assessment: post-procedure vital signs reviewed and stable Respiratory status: spontaneous breathing, nonlabored ventilation and respiratory function stable Cardiovascular status: blood pressure returned to baseline and stable Postop Assessment: adequate PO intake Anesthetic complications: no    Darrin Nipper

## 2017-03-08 NOTE — H&P (Signed)
The History and Physical notes are on paper, have been signed, and are to be scanned. The patient remains stable and unchanged from the H&P.   Previous H&P reviewed, patient examined, and there are no changes.  Lorraine Foley 03/08/2017 7:38 AM   

## 2017-03-17 ENCOUNTER — Ambulatory Visit: Payer: 59 | Admitting: Internal Medicine

## 2017-03-28 ENCOUNTER — Telehealth: Payer: Self-pay | Admitting: Internal Medicine

## 2017-03-28 DIAGNOSIS — E119 Type 2 diabetes mellitus without complications: Secondary | ICD-10-CM

## 2017-03-28 DIAGNOSIS — E7800 Pure hypercholesterolemia, unspecified: Secondary | ICD-10-CM

## 2017-03-28 DIAGNOSIS — I10 Essential (primary) hypertension: Secondary | ICD-10-CM

## 2017-03-28 DIAGNOSIS — N289 Disorder of kidney and ureter, unspecified: Secondary | ICD-10-CM

## 2017-03-28 DIAGNOSIS — E039 Hypothyroidism, unspecified: Secondary | ICD-10-CM

## 2017-03-28 NOTE — Telephone Encounter (Signed)
Orders placed for [facility] labs.   

## 2017-03-29 LAB — BASIC METABOLIC PANEL
BUN / CREAT RATIO: 25 (ref 12–28)
BUN: 27 mg/dL (ref 8–27)
CALCIUM: 9.8 mg/dL (ref 8.7–10.3)
CHLORIDE: 98 mmol/L (ref 96–106)
CO2: 26 mmol/L (ref 20–29)
CREATININE: 1.1 mg/dL — AB (ref 0.57–1.00)
GFR calc non Af Amer: 49 mL/min/{1.73_m2} — ABNORMAL LOW (ref >59)
GFR, EST AFRICAN AMERICAN: 56 mL/min/{1.73_m2} — AB (ref >59)
Glucose: 107 mg/dL — ABNORMAL HIGH (ref 65–99)
Potassium: 5.3 mmol/L — ABNORMAL HIGH (ref 3.5–5.2)
Sodium: 139 mmol/L (ref 134–144)

## 2017-03-29 LAB — VITAMIN D 25 HYDROXY (VIT D DEFICIENCY, FRACTURES): VIT D 25 HYDROXY: 23.5 ng/mL — AB (ref 30.0–100.0)

## 2017-03-29 LAB — HEPATIC FUNCTION PANEL
ALT: 12 IU/L (ref 0–32)
AST: 15 IU/L (ref 0–40)
Albumin: 4.6 g/dL (ref 3.5–4.8)
Alkaline Phosphatase: 60 IU/L (ref 39–117)
Bilirubin Total: 0.8 mg/dL (ref 0.0–1.2)
Bilirubin, Direct: 0.16 mg/dL (ref 0.00–0.40)
TOTAL PROTEIN: 7.4 g/dL (ref 6.0–8.5)

## 2017-03-29 LAB — HEMOGLOBIN A1C
ESTIMATED AVERAGE GLUCOSE: 166 mg/dL
HEMOGLOBIN A1C: 7.4 % — AB (ref 4.8–5.6)

## 2017-03-29 LAB — LIPID PANEL
CHOLESTEROL TOTAL: 189 mg/dL (ref 100–199)
Chol/HDL Ratio: 4.7 ratio — ABNORMAL HIGH (ref 0.0–4.4)
HDL: 40 mg/dL (ref >39)
LDL CALC: 122 mg/dL — AB (ref 0–99)
TRIGLYCERIDES: 136 mg/dL (ref 0–149)
VLDL CHOLESTEROL CAL: 27 mg/dL (ref 5–40)

## 2017-03-29 LAB — TSH: TSH: 0.851 u[IU]/mL (ref 0.450–4.500)

## 2017-03-30 ENCOUNTER — Telehealth: Payer: Self-pay | Admitting: Internal Medicine

## 2017-03-30 DIAGNOSIS — E875 Hyperkalemia: Secondary | ICD-10-CM

## 2017-03-30 NOTE — Telephone Encounter (Signed)
Order placed for f/u potassium check - to be drawn at [facility].

## 2017-04-03 ENCOUNTER — Ambulatory Visit (INDEPENDENT_AMBULATORY_CARE_PROVIDER_SITE_OTHER): Payer: 59 | Admitting: Internal Medicine

## 2017-04-03 ENCOUNTER — Encounter: Payer: Self-pay | Admitting: Internal Medicine

## 2017-04-03 VITALS — BP 138/60 | HR 60 | Temp 98.3°F | Resp 12 | Ht 60.0 in | Wt 207.0 lb

## 2017-04-03 DIAGNOSIS — E559 Vitamin D deficiency, unspecified: Secondary | ICD-10-CM

## 2017-04-03 DIAGNOSIS — Z6841 Body Mass Index (BMI) 40.0 and over, adult: Secondary | ICD-10-CM

## 2017-04-03 DIAGNOSIS — K219 Gastro-esophageal reflux disease without esophagitis: Secondary | ICD-10-CM

## 2017-04-03 DIAGNOSIS — E119 Type 2 diabetes mellitus without complications: Secondary | ICD-10-CM | POA: Diagnosis not present

## 2017-04-03 DIAGNOSIS — Z23 Encounter for immunization: Secondary | ICD-10-CM

## 2017-04-03 DIAGNOSIS — I10 Essential (primary) hypertension: Secondary | ICD-10-CM | POA: Diagnosis not present

## 2017-04-03 DIAGNOSIS — E7800 Pure hypercholesterolemia, unspecified: Secondary | ICD-10-CM | POA: Diagnosis not present

## 2017-04-03 DIAGNOSIS — E039 Hypothyroidism, unspecified: Secondary | ICD-10-CM

## 2017-04-03 DIAGNOSIS — N289 Disorder of kidney and ureter, unspecified: Secondary | ICD-10-CM | POA: Diagnosis not present

## 2017-04-03 LAB — HM DIABETES FOOT EXAM

## 2017-04-03 NOTE — Progress Notes (Signed)
Patient ID: Lorraine Foley, female   DOB: 1941/01/24, 76 y.o.   MRN: [id number]   Subjective:    Patient ID: Lorraine Foley, female    DOB: [birth year], 76 y.o.   MRN: [id number]  HPI  Patient here for a scheduled follow up.  She reports she has been under increased stress recently.  Has had a lot of family members pass away.  She has not been watching her diet as well.  Not exercising.  Plans to get back in her previous routine.  Was doing well with her diet and walking - previously.  Discussed her labs.  Discussed elevated a1c - 7.4.  Discussed cholesterol results.  Discussed diet and exercise.  No chest pain.  Breathing has been doing well.  No acid reflux.  No abdominal pain.  Bowels moving.  Blood pressure has been doing well.  States averaging same as reading today in the office.     Past Medical History:  Diagnosis Date  . Arthritis    knees  . Diabetes mellitus ([place])   . GERD (gastroesophageal reflux disease)   . Glaucoma   . Hypercholesterolemia   . Hypertension   . Hypothyroidism    Past Surgical History:  Procedure Laterality Date  . CATARACT EXTRACTION W/PHACO Left 11/09/2016   Procedure: CATARACT EXTRACTION PHACO AND INTRAOCULAR LENS PLACEMENT ([place])  left diabetic complicated;  Surgeon: Leandrew Koyanagi, MD;  Location: [place];  Service: Ophthalmology;  Laterality: Left;  Diabetic - oral meds  . CATARACT EXTRACTION W/PHACO Right 03/08/2017   Procedure: CATARACT EXTRACTION PHACO AND INTRAOCULAR LENS PLACEMENT ([place])  Right Diabetic complicated;  Surgeon: Leandrew Koyanagi, MD;  Location: [place];  Service: Ophthalmology;  Laterality: Right;  . EYE SURGERY Bilateral 08/2014, 2.2016   laser surgery in preparation for glaucoma surgery  . TOTAL HIP ARTHROPLASTY  5/13   right   Family History  Problem Relation Age of Onset  . Mental illness Father        suicide  . Liver disease Father        alcohol  . Glaucoma Father   . Alcohol abuse Father   .  Blindness Father   . Glaucoma Mother   . Osteoporosis Mother   . Diabetes Sister   . Breast cancer Sister 35  . Colon cancer Neg Hx    Social History   Social History  . Marital status: Divorced    Spouse name: N/A  . Number of children: 1  . Years of education: N/A   Social History Main Topics  . Smoking status: Never Smoker  . Smokeless tobacco: Never Used  . Alcohol use No  . Drug use: No  . Sexual activity: Not Asked   Other Topics Concern  . None   Social History Narrative  . None    Outpatient Encounter Prescriptions as of 04/03/2017  Medication Sig  . amLODipine (NORVASC) 5 MG tablet TAKE 1 TABLET BY MOUTH  DAILY  . aspirin 81 MG tablet Take 81 mg by mouth daily.  . cloNIDine (CATAPRES) 0.1 MG tablet TAKE 1 TABLET BY MOUTH 3  TIMES A DAY  . glucose blood (ACCU-CHEK COMPACT STRIPS) test strip Check sugar daily Dx E11.9  . glucose blood test strip accuchek compact test strips Check blood sugar tid  . latanoprost (XALATAN) 0.005 % ophthalmic solution As directed  . LevOCARNitine (L-CARNITINE) 250 MG CAPS Take 1 capsule by mouth daily.  Marland Kitchen levothyroxine (SYNTHROID, LEVOTHROID) 100 MCG tablet TAKE 1 TABLET  BY MOUTH  DAILY  . pantoprazole (PROTONIX) 40 MG tablet TAKE 1 TABLET BY MOUTH  DAILY  . pravastatin (PRAVACHOL) 40 MG tablet TAKE 2 TABLETS BY MOUTH  DAILY  . quinapril (ACCUPRIL) 40 MG tablet TAKE 1 TABLET BY MOUTH TWO  TIMES DAILY  . spironolactone-hydrochlorothiazide (ALDACTAZIDE) 25-25 MG tablet TAKE 1 TABLET BY MOUTH  DAILY  . timolol (TIMOPTIC) 0.5 % ophthalmic solution   . calcium-vitamin D (CALCIUM 500+D) 500-200 MG-UNIT per tablet Take 1 tablet by mouth daily.  . Cholecalciferol (VITAMIN D-3) 1000 UNITS CAPS Take 1 capsule by mouth daily.  Marland Kitchen glucosamine-chondroitin 500-400 MG tablet Take 1 tablet by mouth daily.  . Multiple Vitamin (MULTIVITAMIN) tablet Take 1 tablet by mouth daily.  Marland Kitchen omega-3 acid ethyl esters (LOVAZA) 1 G capsule Take 2 g by mouth daily.     No facility-administered encounter medications on file as of 04/03/2017.     Review of Systems  Constitutional: Negative for appetite change and unexpected weight change.  HENT: Negative for congestion and sinus pressure.   Respiratory: Negative for chest tightness and shortness of breath.        No significant cough or congestion.   Cardiovascular: Negative for chest pain, palpitations and leg swelling.  Gastrointestinal: Negative for abdominal pain, diarrhea, nausea and vomiting.  Genitourinary: Negative for difficulty urinating and dysuria.  Musculoskeletal: Negative for back pain.       Persistent knee pain.  Has seen Dr Marry Guan.  Not ready for any further intervention.   Skin: Negative for color change and rash.  Neurological: Negative for dizziness, light-headedness and headaches.  Psychiatric/Behavioral: Negative for agitation and dysphoric mood.       Objective:    Physical Exam  Constitutional: She appears well-developed and well-nourished. No distress.  HENT:  Nose: Nose normal.  Mouth/Throat: Oropharynx is clear and moist.  Neck: Neck supple. No thyromegaly present.  Cardiovascular: Normal rate and regular rhythm.   Pulmonary/Chest: Breath sounds normal. No respiratory distress. She has no wheezes.  Abdominal: Soft. Bowel sounds are normal. There is no tenderness.  Musculoskeletal: She exhibits no edema or tenderness.  Feet:  No lesion.  Intact to pin prick (with monofilament) and light touch.  DP pulses palpable and equal bilaterally.   Lymphadenopathy:    She has no cervical adenopathy.  Skin: No rash noted. No erythema.  Psychiatric: She has a normal mood and affect. Her behavior is normal.    BP 138/60 (BP Location: Left Arm, Patient Position: Sitting, Cuff Size: Large)   Pulse 60   Temp 98.3 F (36.8 C) (Oral)   Resp 12   Ht 5' (1.524 m)   Wt 207 lb (93.9 kg)   LMP 08/11/1995   SpO2 96%   BMI 40.43 kg/m  Wt Readings from Last 3 Encounters:  04/03/17  207 lb (93.9 kg)  03/08/17 204 lb (92.5 kg)  11/11/16 201 lb (91.2 kg)     Lab Results  Component Value Date   WBC 6.0 11/18/2016   HGB 13.7 11/18/2016   HCT 39.4 11/18/2016   PLT 240 11/18/2016   GLUCOSE 107 (H) 03/28/2017   CHOL 189 03/28/2017   TRIG 136 03/28/2017   HDL 40 03/28/2017   LDLCALC 122 (H) 03/28/2017   ALT 12 03/28/2017   AST 15 03/28/2017   NA 139 03/28/2017   K 5.3 (H) 03/28/2017   CL 98 03/28/2017   CREATININE 1.10 (H) 03/28/2017   BUN 27 03/28/2017   CO2 26 03/28/2017  TSH 0.851 03/28/2017   INR 0.9 12/19/2011   HGBA1C 7.4 (H) 03/28/2017       Assessment & Plan:   Problem List Items Addressed This Visit    BMI 40.0-44.9, adult ([place])    Discussed diet and exercise.  She plans to get back in her routine.  Follow.        Diabetes mellitus ([place])    Low carb diet and exercise.  Discussed her recent labs and diet and exercise.  a1c 7.4.  She plans to get back in her routine.  Follow met b and a1c.  Hold on changing medication.  Follow.        GERD (gastroesophageal reflux disease)    Controlled on protonix.        Hypercholesterolemia    On pravastatin.  Low cholesterol diet and exercise.  Follow lipid panel and liver function tests.  Discussed recent labs.  She plans to get back in her routine of diet and exercise.        Hypertension    Blood pressure under good control.  Continue same medication regimen.  Follow pressures.  Follow metabolic panel.        Hypothyroidism    On thyroid replacement.  Follow tsh.        Renal insufficiency    Creatinine stable - 1.1.  Avoid antiinflammatories.  Follow metabolic panel.        Vitamin D deficiency    Start vitamin D supplements.         Other Visit Diagnoses    Need for pneumococcal vaccine    -  Primary       Einar Pheasant, MD

## 2017-04-03 NOTE — Progress Notes (Signed)
Pre-visit discussion using our clinic review tool. No additional management support is needed unless otherwise documented below in the visit note.  

## 2017-04-03 NOTE — Patient Instructions (Signed)
Vitamin D3 1000 units per day 

## 2017-04-04 ENCOUNTER — Encounter: Payer: Self-pay | Admitting: Internal Medicine

## 2017-04-04 DIAGNOSIS — E559 Vitamin D deficiency, unspecified: Secondary | ICD-10-CM | POA: Insufficient documentation

## 2017-04-04 NOTE — Assessment & Plan Note (Signed)
Discussed diet and exercise.  She plans to get back in her routine.  Follow.

## 2017-04-04 NOTE — Assessment & Plan Note (Signed)
Controlled on protonix.   

## 2017-04-04 NOTE — Assessment & Plan Note (Signed)
On pravastatin.  Low cholesterol diet and exercise.  Follow lipid panel and liver function tests.  Discussed recent labs.  She plans to get back in her routine of diet and exercise.

## 2017-04-04 NOTE — Assessment & Plan Note (Signed)
Low carb diet and exercise.  Discussed her recent labs and diet and exercise.  a1c 7.4.  She plans to get back in her routine.  Follow met b and a1c.  Hold on changing medication.  Follow.   

## 2017-04-04 NOTE — Assessment & Plan Note (Signed)
Blood pressure under good control.  Continue same medication regimen.  Follow pressures.  Follow metabolic panel.   

## 2017-04-04 NOTE — Assessment & Plan Note (Signed)
Creatinine stable - 1.1.  Avoid antiinflammatories.  Follow metabolic panel.

## 2017-04-04 NOTE — Assessment & Plan Note (Signed)
Start vitamin D supplements.

## 2017-04-04 NOTE — Assessment & Plan Note (Signed)
On thyroid replacement.  Follow tsh.  

## 2017-04-11 LAB — POTASSIUM: Potassium: 4.3 mmol/L (ref 3.5–5.2)

## 2017-04-18 ENCOUNTER — Other Ambulatory Visit: Payer: Self-pay | Admitting: Internal Medicine

## 2017-07-06 ENCOUNTER — Encounter: Payer: Self-pay | Admitting: Internal Medicine

## 2017-07-06 ENCOUNTER — Other Ambulatory Visit: Payer: Self-pay

## 2017-07-06 ENCOUNTER — Ambulatory Visit (INDEPENDENT_AMBULATORY_CARE_PROVIDER_SITE_OTHER): Payer: 59 | Admitting: Internal Medicine

## 2017-07-06 VITALS — BP 150/76 | HR 56 | Temp 97.5°F | Resp 16 | Ht 60.0 in | Wt 205.8 lb

## 2017-07-06 DIAGNOSIS — N289 Disorder of kidney and ureter, unspecified: Secondary | ICD-10-CM

## 2017-07-06 DIAGNOSIS — E039 Hypothyroidism, unspecified: Secondary | ICD-10-CM | POA: Diagnosis not present

## 2017-07-06 DIAGNOSIS — E559 Vitamin D deficiency, unspecified: Secondary | ICD-10-CM

## 2017-07-06 DIAGNOSIS — Z0000 Encounter for general adult medical examination without abnormal findings: Secondary | ICD-10-CM | POA: Diagnosis not present

## 2017-07-06 DIAGNOSIS — Z6841 Body Mass Index (BMI) 40.0 and over, adult: Secondary | ICD-10-CM | POA: Diagnosis not present

## 2017-07-06 DIAGNOSIS — I10 Essential (primary) hypertension: Secondary | ICD-10-CM | POA: Diagnosis not present

## 2017-07-06 DIAGNOSIS — E7800 Pure hypercholesterolemia, unspecified: Secondary | ICD-10-CM | POA: Diagnosis not present

## 2017-07-06 DIAGNOSIS — M25562 Pain in left knee: Secondary | ICD-10-CM

## 2017-07-06 DIAGNOSIS — E119 Type 2 diabetes mellitus without complications: Secondary | ICD-10-CM

## 2017-07-06 NOTE — Progress Notes (Signed)
Patient ID: Lorraine Foley, female   DOB: 1941/06/09, 76 y.o.   MRN: [id number]   Subjective:    Patient ID: Lorraine Foley, female    DOB: [birth year], 76 y.o.   MRN: [id number]  HPI  Patient here for her physical exam.  She reports she is doing relatively well.  Having some increased discomfort in her left knee.  Noticed more when she went to [place].  Was doing a lot of standing.  Request referral back to Dr Marry Guan.  Has seen him previously.  States she is trying to stay active.  No chest pain.  No sob.  No acid reflux.  No abdominal pain.  Bowels moving.  States her blood pressure has been doing well for her.     Past Medical History:  Diagnosis Date  . Arthritis    knees  . Diabetes mellitus ([place])   . GERD (gastroesophageal reflux disease)   . Glaucoma   . Hypercholesterolemia   . Hypertension   . Hypothyroidism    Past Surgical History:  Procedure Laterality Date  . EYE SURGERY Bilateral 08/2014, 2.2016   laser surgery in preparation for glaucoma surgery  . TOTAL HIP ARTHROPLASTY  5/13   right   Family History  Problem Relation Age of Onset  . Mental illness Father        suicide  . Liver disease Father        alcohol  . Glaucoma Father   . Alcohol abuse Father   . Blindness Father   . Glaucoma Mother   . Osteoporosis Mother   . Diabetes Sister   . Breast cancer Sister 34  . Colon cancer Neg Hx    Social History   Socioeconomic History  . Marital status: Divorced    Spouse name: None  . Number of children: 1  . Years of education: None  . Highest education level: None  Social Needs  . Financial resource strain: None  . Food insecurity - worry: None  . Food insecurity - inability: None  . Transportation needs - medical: None  . Transportation needs - non-medical: None  Occupational History  . None  Tobacco Use  . Smoking status: Never Smoker  . Smokeless tobacco: Never Used  Substance and Sexual Activity  . Alcohol use: No    Alcohol/week: 0.0 oz  .  Drug use: No  . Sexual activity: None  Other Topics Concern  . None  Social History Narrative  . None    Outpatient Encounter Medications as of 07/06/2017  Medication Sig  . amLODipine (NORVASC) 5 MG tablet TAKE 1 TABLET BY MOUTH  DAILY  . aspirin 81 MG tablet Take 81 mg by mouth daily.  . calcium-vitamin D (CALCIUM 500+D) 500-200 MG-UNIT per tablet Take 1 tablet by mouth daily.  . Cholecalciferol (VITAMIN D-3) 1000 UNITS CAPS Take 1 capsule by mouth daily.  . cloNIDine (CATAPRES) 0.1 MG tablet TAKE 1 TABLET BY MOUTH 3  TIMES A DAY  . glucosamine-chondroitin 500-400 MG tablet Take 1 tablet by mouth daily.  Marland Kitchen glucose blood (ACCU-CHEK COMPACT STRIPS) test strip Check sugar daily Dx E11.9  . glucose blood test strip accuchek compact test strips Check blood sugar tid  . latanoprost (XALATAN) 0.005 % ophthalmic solution As directed  . LevOCARNitine (L-CARNITINE) 250 MG CAPS Take 1 capsule by mouth daily.  Marland Kitchen levothyroxine (SYNTHROID, LEVOTHROID) 100 MCG tablet TAKE 1 TABLET BY MOUTH  DAILY  . Multiple Vitamin (MULTIVITAMIN) tablet Take 1 tablet by mouth  daily.  . omega-3 acid ethyl esters (LOVAZA) 1 G capsule Take 2 g by mouth daily.  . pantoprazole (PROTONIX) 40 MG tablet TAKE 1 TABLET BY MOUTH  DAILY  . pravastatin (PRAVACHOL) 40 MG tablet TAKE 2 TABLETS BY MOUTH  DAILY  . quinapril (ACCUPRIL) 40 MG tablet TAKE 1 TABLET BY MOUTH TWO  TIMES DAILY  . spironolactone-hydrochlorothiazide (ALDACTAZIDE) 25-25 MG tablet TAKE 1 TABLET BY MOUTH  DAILY  . timolol (TIMOPTIC) 0.5 % ophthalmic solution    No facility-administered encounter medications on file as of 07/06/2017.     Review of Systems  Constitutional: Negative for appetite change and unexpected weight change.  HENT: Negative for congestion and sinus pressure.   Eyes: Negative for pain and visual disturbance.  Respiratory: Negative for cough, chest tightness and shortness of breath.   Cardiovascular: Negative for chest pain,  palpitations and leg swelling.  Gastrointestinal: Negative for abdominal pain, diarrhea and nausea.  Genitourinary: Negative for difficulty urinating and dysuria.  Musculoskeletal: Negative for back pain.       Left knee pain as outlined.    Skin: Negative for color change and rash.  Neurological: Negative for dizziness, light-headedness and headaches.  Hematological: Negative for adenopathy. Does not bruise/bleed easily.  Psychiatric/Behavioral: Negative for agitation and dysphoric mood.       Objective:    Physical Exam  Constitutional: She is oriented to person, place, and time. She appears well-developed and well-nourished. No distress.  HENT:  Nose: Nose normal.  Mouth/Throat: Oropharynx is clear and moist.  Eyes: Right eye exhibits no discharge. Left eye exhibits no discharge. No scleral icterus.  Neck: Neck supple. No thyromegaly present.  Cardiovascular: Normal rate and regular rhythm.  Pulmonary/Chest: Breath sounds normal. No accessory muscle usage. No tachypnea. No respiratory distress. She has no decreased breath sounds. She has no wheezes. She has no rhonchi. Right breast exhibits no inverted nipple, no mass, no nipple discharge and no tenderness (no axillary adenopathy). Left breast exhibits no inverted nipple, no mass, no nipple discharge and no tenderness (no axilarry adenopathy).  Abdominal: Soft. Bowel sounds are normal. There is no tenderness.  Musculoskeletal: She exhibits no tenderness.  No increased edema.    Lymphadenopathy:    She has no cervical adenopathy.  Neurological: She is alert and oriented to person, place, and time.  Skin: Skin is warm. No rash noted. No erythema.  Psychiatric: She has a normal mood and affect. Her behavior is normal.    BP (!) 150/76 (BP Location: Left Arm, Patient Position: Sitting, Cuff Size: Large)   Pulse (!) 56   Temp (!) 97.5 F (36.4 C) (Oral)   Resp 16   Ht 5' (1.524 m)   Wt 205 lb 12.8 oz (93.4 kg)   LMP 08/11/1995    SpO2 97%   BMI 40.19 kg/m  Wt Readings from Last 3 Encounters:  07/06/17 205 lb 12.8 oz (93.4 kg)  04/03/17 207 lb (93.9 kg)  03/08/17 204 lb (92.5 kg)     Lab Results  Component Value Date   WBC 6.0 11/18/2016   HGB 13.7 11/18/2016   HCT 39.4 11/18/2016   PLT 240 11/18/2016   GLUCOSE 107 (H) 03/28/2017   CHOL 189 03/28/2017   TRIG 136 03/28/2017   HDL 40 03/28/2017   LDLCALC 122 (H) 03/28/2017   ALT 12 03/28/2017   AST 15 03/28/2017   NA 139 03/28/2017   K 4.3 04/10/2017   CL 98 03/28/2017   CREATININE 1.10 (H) 03/28/2017  BUN 27 03/28/2017   CO2 26 03/28/2017   TSH 0.851 03/28/2017   INR 0.9 12/19/2011   HGBA1C 7.4 (H) 03/28/2017       Assessment & Plan:   Problem List Items Addressed This Visit    BMI 40.0-44.9, adult ([place])    Discussed diet and exercise.  Follow.        Diabetes mellitus ([place])    Low carb diet and exercise.  Follow met b and a1c.        Relevant Orders   Hemoglobin Q2V   Basic metabolic panel   Health care maintenance    Physical today 07/06/17.  Mammogram 12/15/16 - Birads I.  Colonoscopy 07/08/13.        Hypercholesterolemia    On pravastatin.  Low cholesterol diet and exercise.  Follow lipid panel and liver function tests.        Relevant Orders   Lipid panel   Hepatic function panel   Hypertension    Blood pressure slightly elevated today.  Has been under better control.  Same medication regimen.  Follow pressures.  Follow metabolic panel.        Hypothyroidism    On thyroid replacement.  Follow tsh.        Renal insufficiency    Last creatinine 1.1.  Stay hydrated.  Avoid antiinflammatories.  Follow met b.        Vitamin D deficiency    Follow vitamin D level.        Relevant Orders   VITAMIN D 25 Hydroxy (Vit-D Deficiency, Fractures)    Other Visit Diagnoses    Routine general medical examination at a health care facility    -  Primary   Left knee pain, unspecified chronicity       Persistent.  request  referral to Dr Marry Guan.     Relevant Orders   Ambulatory referral to Orthopedic Surgery       Einar Pheasant, MD

## 2017-07-06 NOTE — Assessment & Plan Note (Signed)
Physical today 07/06/17.  Mammogram 12/15/16 - Birads I.  Colonoscopy 07/08/13.

## 2017-07-09 ENCOUNTER — Encounter: Payer: Self-pay | Admitting: Internal Medicine

## 2017-07-09 NOTE — Assessment & Plan Note (Signed)
Blood pressure slightly elevated today.  Has been under better control.  Same medication regimen.  Follow pressures.  Follow metabolic panel.

## 2017-07-09 NOTE — Assessment & Plan Note (Signed)
Follow vitamin D level.  

## 2017-07-09 NOTE — Assessment & Plan Note (Signed)
On pravastatin.  Low cholesterol diet and exercise.  Follow lipid panel and liver function tests.   

## 2017-07-09 NOTE — Assessment & Plan Note (Signed)
Last creatinine 1.1.  Stay hydrated.  Avoid antiinflammatories.  Follow met b.

## 2017-07-09 NOTE — Assessment & Plan Note (Signed)
Low carb diet and exercise.  Follow met b and a1c.   

## 2017-07-09 NOTE — Assessment & Plan Note (Signed)
On thyroid replacement.  Follow tsh.  

## 2017-07-09 NOTE — Assessment & Plan Note (Signed)
Discussed diet and exercise.  Follow.  

## 2017-07-18 LAB — HM DIABETES EYE EXAM

## 2017-07-22 ENCOUNTER — Other Ambulatory Visit: Payer: Self-pay | Admitting: Internal Medicine

## 2017-07-25 ENCOUNTER — Encounter: Payer: Self-pay | Admitting: *Deleted

## 2017-08-19 LAB — HEPATIC FUNCTION PANEL
ALK PHOS: 59 IU/L (ref 39–117)
ALT: 14 IU/L (ref 0–32)
AST: 17 IU/L (ref 0–40)
Albumin: 4.7 g/dL (ref 3.5–4.8)
BILIRUBIN TOTAL: 0.7 mg/dL (ref 0.0–1.2)
Bilirubin, Direct: 0.15 mg/dL (ref 0.00–0.40)
Total Protein: 7.4 g/dL (ref 6.0–8.5)

## 2017-08-19 LAB — BASIC METABOLIC PANEL
BUN / CREAT RATIO: 22 (ref 12–28)
BUN: 26 mg/dL (ref 8–27)
CO2: 27 mmol/L (ref 20–29)
CREATININE: 1.17 mg/dL — AB (ref 0.57–1.00)
Calcium: 10 mg/dL (ref 8.7–10.3)
Chloride: 99 mmol/L (ref 96–106)
GFR calc non Af Amer: 45 mL/min/{1.73_m2} — ABNORMAL LOW (ref >59)
GFR, EST AFRICAN AMERICAN: 52 mL/min/{1.73_m2} — AB (ref >59)
GLUCOSE: 115 mg/dL — AB (ref 65–99)
Potassium: 5 mmol/L (ref 3.5–5.2)
SODIUM: 142 mmol/L (ref 134–144)

## 2017-08-19 LAB — HEMOGLOBIN A1C
ESTIMATED AVERAGE GLUCOSE: 163 mg/dL
Hgb A1c MFr Bld: 7.3 % — ABNORMAL HIGH (ref 4.8–5.6)

## 2017-08-19 LAB — LIPID PANEL
CHOLESTEROL TOTAL: 187 mg/dL (ref 100–199)
Chol/HDL Ratio: 4.3 ratio (ref 0.0–4.4)
HDL: 44 mg/dL (ref >39)
LDL CALC: 106 mg/dL — AB (ref 0–99)
TRIGLYCERIDES: 184 mg/dL — AB (ref 0–149)
VLDL CHOLESTEROL CAL: 37 mg/dL (ref 5–40)

## 2017-08-19 LAB — VITAMIN D 25 HYDROXY (VIT D DEFICIENCY, FRACTURES): VIT D 25 HYDROXY: 32.9 ng/mL (ref 30.0–100.0)

## 2017-08-23 ENCOUNTER — Telehealth: Payer: Self-pay | Admitting: Internal Medicine

## 2017-08-23 NOTE — Telephone Encounter (Signed)
Patient called back about Lab results.  Per CRM, results can be given.  [facility] so, a telephone encounter has been created. / Attempted call back to the patient, no answer. /

## 2017-08-23 NOTE — Telephone Encounter (Signed)
° °  Relation to pt: self  Call back number: [phone] (W)   Reason for call:  Patient returning call regarding lab results, please call [phone] (W)  Copied from CRM [phone]. Topic: Quick Communication - Lab Results >> Aug 21, 2017  8:33 AM Bronwen BettersBooth, Brock T, CMA wrote: Called patient to inform them of [id number] lab results. When patient returns call, triage nurse may disclose results.

## 2017-08-23 NOTE — Telephone Encounter (Signed)
Result has been routed.

## 2017-08-24 NOTE — Telephone Encounter (Signed)
Pt notified of results and recommendations per notes of Dr. Lorin PicketScott on 12/24. Pt verbalized understanding. Unable to document in result note due to [facility] not being [facility] to [facility].

## 2017-09-13 ENCOUNTER — Other Ambulatory Visit: Payer: Self-pay | Admitting: Internal Medicine

## 2017-09-21 ENCOUNTER — Ambulatory Visit: Payer: 59 | Admitting: Internal Medicine

## 2017-09-21 DIAGNOSIS — E559 Vitamin D deficiency, unspecified: Secondary | ICD-10-CM | POA: Diagnosis not present

## 2017-09-21 DIAGNOSIS — E7800 Pure hypercholesterolemia, unspecified: Secondary | ICD-10-CM

## 2017-09-21 DIAGNOSIS — E039 Hypothyroidism, unspecified: Secondary | ICD-10-CM

## 2017-09-21 DIAGNOSIS — I10 Essential (primary) hypertension: Secondary | ICD-10-CM

## 2017-09-21 DIAGNOSIS — E119 Type 2 diabetes mellitus without complications: Secondary | ICD-10-CM

## 2017-09-21 DIAGNOSIS — K219 Gastro-esophageal reflux disease without esophagitis: Secondary | ICD-10-CM

## 2017-09-21 DIAGNOSIS — Z6841 Body Mass Index (BMI) 40.0 and over, adult: Secondary | ICD-10-CM

## 2017-09-21 MED ORDER — AMLODIPINE BESYLATE 5 MG PO TABS: 5.0000 mg | ORAL_TABLET | Freq: Two times a day (BID) | ORAL | 1 refills | Status: DC

## 2017-09-21 NOTE — Progress Notes (Signed)
Patient ID: Lorraine Foley, female   DOB: 1940/09/16, 77 y.o.   MRN: [id number]   Subjective:    Patient ID: Lorraine Foley, female    DOB: 1940/09/25, 77 y.o.   MRN: [id number]  HPI  Patient here for a scheduled follow up.  States she is doing relatively well.  Having pain in her left knee.  S/p injection.  Not helping.  Plans to f/u with ortho.  Limiting her exercise some.  No chest pain.  No sob.  No acid reflux.  No abdominal pain.  Bowels moving.  No urine change.  Blood pressures running a little higher.  Discussed diet and exercise.     Past Medical History:  Diagnosis Date  . Arthritis    knees  . Diabetes mellitus ([place])   . GERD (gastroesophageal reflux disease)   . Glaucoma   . Hypercholesterolemia   . Hypertension   . Hypothyroidism    Past Surgical History:  Procedure Laterality Date  . CATARACT EXTRACTION W/PHACO Left 11/09/2016   Procedure: CATARACT EXTRACTION PHACO AND INTRAOCULAR LENS PLACEMENT ([place])  left diabetic complicated;  Surgeon: Leandrew Koyanagi, MD;  Location: [facility];  Service: Ophthalmology;  Laterality: Left;  Diabetic - oral meds  . CATARACT EXTRACTION W/PHACO Right 03/08/2017   Procedure: CATARACT EXTRACTION PHACO AND INTRAOCULAR LENS PLACEMENT ([place])  Right Diabetic complicated;  Surgeon: Leandrew Koyanagi, MD;  Location: [facility];  Service: Ophthalmology;  Laterality: Right;  . EYE SURGERY Bilateral 08/2014, 2.2016   laser surgery in preparation for glaucoma surgery  . TOTAL HIP ARTHROPLASTY  5/13   right   Family History  Problem Relation Age of Onset  . Mental illness Father        suicide  . Liver disease Father        alcohol  . Glaucoma Father   . Alcohol abuse Father   . Blindness Father   . Glaucoma Mother   . Osteoporosis Mother   . Diabetes Sister   . Breast cancer Sister 45  . Colon cancer Neg Hx    Social History   Socioeconomic History  . Marital status: Divorced    Spouse name: None  . Number of  children: 1  . Years of education: None  . Highest education level: None  Social Needs  . Financial resource strain: None  . Food insecurity - worry: None  . Food insecurity - inability: None  . Transportation needs - medical: None  . Transportation needs - non-medical: None  Occupational History  . None  Tobacco Use  . Smoking status: Never Smoker  . Smokeless tobacco: Never Used  Substance and Sexual Activity  . Alcohol use: No    Alcohol/week: 0.0 oz  . Drug use: No  . Sexual activity: None  Other Topics Concern  . None  Social History Narrative  . None    Review of Systems  Constitutional: Negative for appetite change and unexpected weight change.  HENT: Negative for congestion and sinus pressure.   Respiratory: Negative for cough, chest tightness and shortness of breath.   Cardiovascular: Negative for chest pain, palpitations and leg swelling.  Gastrointestinal: Negative for abdominal pain, diarrhea, nausea and vomiting.  Genitourinary: Negative for difficulty urinating and dysuria.  Musculoskeletal: Negative for myalgias.       Knee pain as outlined.    Skin: Negative for color change and rash.  Neurological: Negative for dizziness, light-headedness and headaches.  Psychiatric/Behavioral: Negative for agitation and dysphoric mood.  Objective:    Physical Exam  Constitutional: She appears well-developed and well-nourished. No distress.  HENT:  Nose: Nose normal.  Mouth/Throat: Oropharynx is clear and moist.  Neck: Neck supple. No thyromegaly present.  Cardiovascular: Normal rate and regular rhythm.  Pulmonary/Chest: Breath sounds normal. No respiratory distress. She has no wheezes.  Abdominal: Soft. Bowel sounds are normal. There is no tenderness.  Musculoskeletal: She exhibits no edema or tenderness.  Lymphadenopathy:    She has no cervical adenopathy.  Skin: No rash noted. No erythema.  Psychiatric: She has a normal mood and affect. Her behavior is  normal.    BP (!) 142/74 (BP Location: Left Arm, Patient Position: Sitting, Cuff Size: Large)   Pulse (!) 56   Temp 98.1 F (36.7 C)   Resp 18   Wt 210 lb 9.6 oz (95.5 kg)   LMP 08/11/1995   SpO2 97%   BMI 41.13 kg/m  Wt Readings from Last 3 Encounters:  09/21/17 210 lb 9.6 oz (95.5 kg)  07/06/17 205 lb 12.8 oz (93.4 kg)  04/03/17 207 lb (93.9 kg)     Lab Results  Component Value Date   WBC 6.0 11/18/2016   HGB 13.7 11/18/2016   HCT 39.4 11/18/2016   PLT 240 11/18/2016   GLUCOSE 115 (H) 08/18/2017   CHOL 187 08/18/2017   TRIG 184 (H) 08/18/2017   HDL 44 08/18/2017   LDLCALC 106 (H) 08/18/2017   ALT 14 08/18/2017   AST 17 08/18/2017   NA 142 08/18/2017   K 5.0 08/18/2017   CL 99 08/18/2017   CREATININE 1.17 (H) 08/18/2017   BUN 26 08/18/2017   CO2 27 08/18/2017   TSH 0.851 03/28/2017   INR 0.9 12/19/2011   HGBA1C 7.3 (H) 08/18/2017       Assessment & Plan:   Problem List Items Addressed This Visit    BMI 40.0-44.9, adult ([place])    Discussed diet and exercise.  Follow.        Diabetes mellitus ([place])    Low carb diet and exercise.  Follow met b and a1c.  Discussed diet and exercise.  Follow.       GERD (gastroesophageal reflux disease)    Controlled on current medication regimen.  Follow.       Hypercholesterolemia    On pravastatin.  Low cholesterol diet and exercise.  Follow lipid panel and liver function tests.        Relevant Medications   amLODipine (NORVASC) 5 MG tablet   Hypertension    Blood pressure remaining elevated.  Increase amlodipine to '5mg'$  bid.  Follow pressures.  Follow metabolic panel.        Relevant Medications   amLODipine (NORVASC) 5 MG tablet   Hypothyroidism    On thyroid replacement.  Follow tsh.        Vitamin D deficiency    Follow vitamin D level.           Einar Pheasant, MD

## 2017-09-21 NOTE — Patient Instructions (Signed)
Increase amlodipine to 5mg twice a day.  

## 2017-09-24 ENCOUNTER — Encounter: Payer: Self-pay | Admitting: Internal Medicine

## 2017-09-24 NOTE — Assessment & Plan Note (Signed)
On thyroid replacement.  Follow tsh.  

## 2017-09-24 NOTE — Assessment & Plan Note (Signed)
Discussed diet and exercise.  Follow.  

## 2017-09-24 NOTE — Assessment & Plan Note (Signed)
On pravastatin.  Low cholesterol diet and exercise.  Follow lipid panel and liver function tests.   

## 2017-09-24 NOTE — Assessment & Plan Note (Signed)
Follow vitamin D level.  

## 2017-09-24 NOTE — Assessment & Plan Note (Addendum)
Low carb diet and exercise.  Follow met b and a1c.  Discussed diet and exercise.  Follow.

## 2017-09-24 NOTE — Assessment & Plan Note (Signed)
Controlled on current medication regimen.  Follow.   

## 2017-09-24 NOTE — Assessment & Plan Note (Signed)
Blood pressure remaining elevated.  Increase amlodipine to 5mg  bid.  Follow pressures.  Follow metabolic panel.

## 2017-10-03 ENCOUNTER — Other Ambulatory Visit: Payer: Self-pay | Admitting: Internal Medicine

## 2017-11-13 ENCOUNTER — Other Ambulatory Visit: Payer: Self-pay | Admitting: Internal Medicine

## 2017-12-18 ENCOUNTER — Telehealth: Payer: Self-pay

## 2017-12-18 NOTE — Telephone Encounter (Signed)
Copied from CRM [phone]. Topic: General - Other >> Dec 18, 2017  4:04 PM Percival SpanishKennedy, Cheryl W wrote:   Pt call to ask if her RX for labs [facility].

## 2017-12-18 NOTE — Telephone Encounter (Signed)
Patient is requesting lab orders for [facility]

## 2017-12-19 ENCOUNTER — Telehealth: Payer: Self-pay | Admitting: Internal Medicine

## 2017-12-19 DIAGNOSIS — E119 Type 2 diabetes mellitus without complications: Secondary | ICD-10-CM

## 2017-12-19 DIAGNOSIS — I10 Essential (primary) hypertension: Secondary | ICD-10-CM

## 2017-12-19 DIAGNOSIS — E7800 Pure hypercholesterolemia, unspecified: Secondary | ICD-10-CM

## 2017-12-19 NOTE — Telephone Encounter (Signed)
Orders placed for [facility] labs.   

## 2017-12-19 NOTE — Telephone Encounter (Signed)
Orders placed for labs.  She also needs a f/u appt scheduled with me.

## 2017-12-20 ENCOUNTER — Telehealth: Payer: Self-pay | Admitting: Internal Medicine

## 2017-12-20 LAB — LIPID PANEL
CHOL/HDL RATIO: 4.3 ratio (ref 0.0–4.4)
CHOLESTEROL TOTAL: 208 mg/dL — AB (ref 100–199)
HDL: 48 mg/dL (ref >39)
LDL Calculated: 125 mg/dL — ABNORMAL HIGH (ref 0–99)
TRIGLYCERIDES: 174 mg/dL — AB (ref 0–149)
VLDL Cholesterol Cal: 35 mg/dL (ref 5–40)

## 2017-12-20 LAB — BASIC METABOLIC PANEL
BUN / CREAT RATIO: 23 (ref 12–28)
BUN: 24 mg/dL (ref 8–27)
CALCIUM: 10 mg/dL (ref 8.7–10.3)
CHLORIDE: 101 mmol/L (ref 96–106)
CO2: 28 mmol/L (ref 20–29)
CREATININE: 1.03 mg/dL — AB (ref 0.57–1.00)
GFR calc non Af Amer: 53 mL/min/{1.73_m2} — ABNORMAL LOW (ref >59)
GFR, EST AFRICAN AMERICAN: 61 mL/min/{1.73_m2} (ref >59)
Glucose: 115 mg/dL — ABNORMAL HIGH (ref 65–99)
Potassium: 5 mmol/L (ref 3.5–5.2)
Sodium: 144 mmol/L (ref 134–144)

## 2017-12-20 LAB — HEPATIC FUNCTION PANEL
ALK PHOS: 47 IU/L (ref 39–117)
ALT: 16 IU/L (ref 0–32)
AST: 19 IU/L (ref 0–40)
Albumin: 4.6 g/dL (ref 3.5–4.8)
Bilirubin Total: 0.5 mg/dL (ref 0.0–1.2)
Bilirubin, Direct: 0.12 mg/dL (ref 0.00–0.40)
TOTAL PROTEIN: 7.1 g/dL (ref 6.0–8.5)

## 2017-12-20 LAB — HEMOGLOBIN A1C
ESTIMATED AVERAGE GLUCOSE: 151 mg/dL
Hgb A1c MFr Bld: 6.9 % — ABNORMAL HIGH (ref 4.8–5.6)

## 2017-12-20 LAB — MICROALBUMIN / CREATININE URINE RATIO
Creatinine, Urine: 17.2 mg/dL
Microalbumin, Urine: <3 ug/mL

## 2017-12-20 LAB — CBC WITH DIFFERENTIAL/PLATELET
BASOS ABS: 0 10*3/uL (ref 0.0–0.2)
Basos: 0 %
EOS (ABSOLUTE): 0.3 10*3/uL (ref 0.0–0.4)
EOS: 4 %
HEMATOCRIT: 41.5 % (ref 34.0–46.6)
Hemoglobin: 13.9 g/dL (ref 11.1–15.9)
IMMATURE GRANULOCYTES: 0 %
Immature Grans (Abs): 0 10*3/uL (ref 0.0–0.1)
Lymphocytes Absolute: 2 10*3/uL (ref 0.7–3.1)
Lymphs: 27 %
MCH: 29.8 pg (ref 26.6–33.0)
MCHC: 33.5 g/dL (ref 31.5–35.7)
MCV: 89 fL (ref 79–97)
MONOCYTES: 6 %
MONOS ABS: 0.5 10*3/uL (ref 0.1–0.9)
NEUTROS PCT: 63 %
Neutrophils Absolute: 4.6 10*3/uL (ref 1.4–7.0)
Platelets: 304 10*3/uL (ref 150–379)
RBC: 4.66 x10E6/uL (ref 3.77–5.28)
RDW: 13.2 % (ref 12.3–15.4)
WBC: 7.4 10*3/uL (ref 3.4–10.8)

## 2017-12-20 NOTE — Telephone Encounter (Signed)
Copied from CRM [phone]. Topic: Quick Communication - Lab Results >> Dec 20, 2017  3:05 PM Larry Sierrasavis, Trisha L, LPN wrote: Called patient to inform them of lab results. When patient returns call, triage nurse may disclose results.  Please contact [phone]

## 2017-12-20 NOTE — Telephone Encounter (Signed)
LMTCB. OK for [facility] to schedule

## 2017-12-22 ENCOUNTER — Telehealth: Payer: Self-pay | Admitting: Internal Medicine

## 2017-12-22 NOTE — Telephone Encounter (Signed)
During result call pt stated she has a new glucose meter and is needing supplies; new meter is Nutritional therapistneTouch VEIRO Flex  Requests RX be [facility] [place], [address]

## 2017-12-22 NOTE — Telephone Encounter (Signed)
Called [facility] to inform them of lab result notes message left on VM to call back  [facility]

## 2017-12-26 MED ORDER — ONETOUCH ULTRASOFT LANCETS MISC: 12 refills | Status: DC

## 2017-12-26 MED ORDER — GLUCOSE BLOOD VI STRP: ORAL_STRIP | 12 refills | Status: DC

## 2017-12-26 NOTE — Addendum Note (Signed)
Addended by: Larry Sierras on: 12/26/2017 10:23 AM   Modules accepted: Orders

## 2017-12-26 NOTE — Telephone Encounter (Signed)
rx sent to [facility] 

## 2018-01-12 ENCOUNTER — Telehealth: Payer: Self-pay | Admitting: Internal Medicine

## 2018-01-12 NOTE — Telephone Encounter (Signed)
Copied from CRM [id number]. Topic: Quick Communication - Rx Refill/Question >> Jan 12, 2018  5:07 PM Mcneil, Ja-Kwan wrote: Medication:  glucose blood test strip (ONETOUCH VERIO), Lancets (ONETOUCH ULTRASOFT) lancets  Preferred Pharmacy (with phone number or street name): [facility] - [place], [place] - [address]    [phone] (Phone) [phone] (Fax)    Agent: Please be advised that RX refills may take up to 3 business days. We ask that you follow-up with your pharmacy.

## 2018-01-15 NOTE — Telephone Encounter (Signed)
Attempted to call patient, no answer. Left message for pt to call [facility] and have her refills for onetouch ultra soft lancets and glucose blood test strips transferred to [facility]. She can call back for any questions or concerns.

## 2018-01-25 ENCOUNTER — Telehealth: Payer: Self-pay | Admitting: Internal Medicine

## 2018-01-25 NOTE — Telephone Encounter (Signed)
Copied from CRM [id number]. Topic: Quick Communication - Rx Refill/Question >> Jan 25, 2018  1:20 PM Rudi Coco, NT wrote: Medication: Lancent one touch Delica Pcs one touch verio strips  Has the patient contacted their pharmacy? yes (Agent: If no, request that the patient contact the pharmacy for the refill.) (Agent: If yes, when and what did the pharmacy advise?)  Preferred Pharmacy (with phone number or street name): [facility] - [place], [place] - [address] [facility] [address] [place] [address] [place] [address] Phone: [phone] Fax: [phone]    Agent: Please be advised that RX refills may take up to 3 business days. We ask that you follow-up with your pharmacy.

## 2018-01-26 ENCOUNTER — Other Ambulatory Visit: Payer: Self-pay

## 2018-01-26 MED ORDER — GLUCOSE BLOOD VI STRP
ORAL_STRIP | 12 refills | Status: DC
Start: 2018-01-26 — End: 2020-10-22

## 2018-01-26 MED ORDER — ONETOUCH ULTRASOFT LANCETS MISC: 12 refills | Status: AC

## 2018-01-26 NOTE — Telephone Encounter (Signed)
Patient is requesting new order for test strips and lancets for new meter.

## 2018-01-26 NOTE — Telephone Encounter (Signed)
Rx's have been sent to [facility]

## 2018-01-29 ENCOUNTER — Other Ambulatory Visit: Payer: Self-pay | Admitting: Internal Medicine

## 2018-03-10 ENCOUNTER — Other Ambulatory Visit: Payer: Self-pay | Admitting: Internal Medicine

## 2018-04-06 ENCOUNTER — Encounter: Payer: Self-pay | Admitting: Internal Medicine

## 2018-04-06 ENCOUNTER — Ambulatory Visit: Payer: 59 | Admitting: Internal Medicine

## 2018-04-06 VITALS — BP 136/72 | HR 65 | Temp 98.1°F | Resp 18 | Wt 207.0 lb

## 2018-04-06 DIAGNOSIS — E7800 Pure hypercholesterolemia, unspecified: Secondary | ICD-10-CM | POA: Diagnosis not present

## 2018-04-06 DIAGNOSIS — E119 Type 2 diabetes mellitus without complications: Secondary | ICD-10-CM

## 2018-04-06 DIAGNOSIS — E039 Hypothyroidism, unspecified: Secondary | ICD-10-CM

## 2018-04-06 DIAGNOSIS — Z8619 Personal history of other infectious and parasitic diseases: Secondary | ICD-10-CM

## 2018-04-06 DIAGNOSIS — N289 Disorder of kidney and ureter, unspecified: Secondary | ICD-10-CM

## 2018-04-06 DIAGNOSIS — E559 Vitamin D deficiency, unspecified: Secondary | ICD-10-CM

## 2018-04-06 DIAGNOSIS — I10 Essential (primary) hypertension: Secondary | ICD-10-CM | POA: Diagnosis not present

## 2018-04-06 NOTE — Progress Notes (Signed)
Patient ID: Lorraine Foley, female   DOB: Apr 16, 1941, 77 y.o.   MRN: [id number]   Subjective:    Patient ID: Lorraine Foley, female    DOB: Aug 31, 1940, 77 y.o.   MRN: [id number]  HPI  Patient here for a scheduled follow up.  States she is doing relatively well.  Reports blood pressure has been averaging 378-588 systolic range.  Did not bring in any recorded blood sugars.  Discussed diet and exercise.  She does try to watch what she eats.  No chest pain.  Breathing stable.  No acid reflux.  No abdominal pain.  Bowels moving.  Overall she feels she is doing well.  Discussed new shingles vaccine.     Past Medical History:  Diagnosis Date  . Arthritis    knees  . Diabetes mellitus ([place])   . GERD (gastroesophageal reflux disease)   . Glaucoma   . Hypercholesterolemia   . Hypertension   . Hypothyroidism    Past Surgical History:  Procedure Laterality Date  . CATARACT EXTRACTION W/PHACO Left 11/09/2016   Procedure: CATARACT EXTRACTION PHACO AND INTRAOCULAR LENS PLACEMENT ([place])  left diabetic complicated;  Surgeon: Leandrew Koyanagi, MD;  Location: [facility];  Service: Ophthalmology;  Laterality: Left;  Diabetic - oral meds  . CATARACT EXTRACTION W/PHACO Right 03/08/2017   Procedure: CATARACT EXTRACTION PHACO AND INTRAOCULAR LENS PLACEMENT ([place])  Right Diabetic complicated;  Surgeon: Leandrew Koyanagi, MD;  Location: [facility];  Service: Ophthalmology;  Laterality: Right;  . EYE SURGERY Bilateral 08/2014, 2.2016   laser surgery in preparation for glaucoma surgery  . TOTAL HIP ARTHROPLASTY  5/13   right   Family History  Problem Relation Age of Onset  . Mental illness Father        suicide  . Liver disease Father        alcohol  . Glaucoma Father   . Alcohol abuse Father   . Blindness Father   . Glaucoma Mother   . Osteoporosis Mother   . Diabetes Sister   . Breast cancer Sister 58  . Colon cancer Neg Hx    Social History   Socioeconomic History  . Marital  status: Divorced    Spouse name: Not on file  . Number of children: 1  . Years of education: Not on file  . Highest education level: Not on file  Occupational History  . Not on file  Social Needs  . Financial resource strain: Not on file  . Food insecurity:    Worry: Not on file    Inability: Not on file  . Transportation needs:    Medical: Not on file    Non-medical: Not on file  Tobacco Use  . Smoking status: Never Smoker  . Smokeless tobacco: Never Used  Substance and Sexual Activity  . Alcohol use: No    Alcohol/week: 0.0 standard drinks  . Drug use: No  . Sexual activity: Not on file  Lifestyle  . Physical activity:    Days per week: Not on file    Minutes per session: Not on file  . Stress: Not on file  Relationships  . Social connections:    Talks on phone: Not on file    Gets together: Not on file    Attends religious service: Not on file    Active member of club or organization: Not on file    Attends meetings of clubs or organizations: Not on file    Relationship status: Not on file  Other Topics Concern  . Not on file  Social History Narrative  . Not on file    Outpatient Encounter Medications as of 04/06/2018  Medication Sig  . amLODipine (NORVASC) 5 MG tablet TAKE 1 TABLET BY MOUTH TWO  TIMES DAILY  . aspirin 81 MG tablet Take 81 mg by mouth daily.  . calcium-vitamin D (CALCIUM 500+D) 500-200 MG-UNIT per tablet Take 1 tablet by mouth daily.  . Cholecalciferol (VITAMIN D-3) 1000 UNITS CAPS Take 1 capsule by mouth daily.  . cloNIDine (CATAPRES) 0.1 MG tablet TAKE 1 TABLET BY MOUTH 3  TIMES A DAY  . glucosamine-chondroitin 500-400 MG tablet Take 1 tablet by mouth daily.  Marland Kitchen glucose blood test strip Use as instructed to check blood sugars once daily. Dx E11.9  . Lancets (ONETOUCH ULTRASOFT) lancets Use as instructed to check blood sugars once daily. Dx E11.9  . latanoprost (XALATAN) 0.005 % ophthalmic solution As directed  . LevOCARNitine (L-CARNITINE) 250  MG CAPS Take 1 capsule by mouth daily.  Marland Kitchen levothyroxine (SYNTHROID, LEVOTHROID) 100 MCG tablet TAKE 1 TABLET BY MOUTH  DAILY  . Multiple Vitamin (MULTIVITAMIN) tablet Take 1 tablet by mouth daily.  Marland Kitchen omega-3 acid ethyl esters (LOVAZA) 1 G capsule Take 2 g by mouth daily.  . pravastatin (PRAVACHOL) 40 MG tablet TAKE 2 TABLETS BY MOUTH  DAILY  . quinapril (ACCUPRIL) 40 MG tablet TAKE 1 TABLET BY MOUTH TWO  TIMES DAILY  . spironolactone-hydrochlorothiazide (ALDACTAZIDE) 25-25 MG tablet TAKE 1 TABLET BY MOUTH  DAILY  . timolol (TIMOPTIC) 0.5 % ophthalmic solution   . [DISCONTINUED] pantoprazole (PROTONIX) 40 MG tablet TAKE 1 TABLET BY MOUTH  DAILY   No facility-administered encounter medications on file as of 04/06/2018.     Review of Systems  Constitutional: Negative for appetite change and unexpected weight change.  HENT: Negative for congestion and sinus pressure.   Respiratory: Negative for cough, chest tightness and shortness of breath.   Cardiovascular: Negative for chest pain, palpitations and leg swelling.  Gastrointestinal: Negative for abdominal pain, diarrhea, nausea and vomiting.  Genitourinary: Negative for difficulty urinating and dysuria.  Musculoskeletal: Negative for joint swelling and myalgias.       Some persistent knee pain.   Sees ortho.    Skin: Negative for color change and rash.  Neurological: Negative for dizziness, light-headedness and headaches.  Psychiatric/Behavioral: Negative for agitation and dysphoric mood.       Objective:    Physical Exam  Constitutional: She appears well-developed and well-nourished. No distress.  HENT:  Nose: Nose normal.  Mouth/Throat: Oropharynx is clear and moist.  Neck: Neck supple. No thyromegaly present.  Cardiovascular: Normal rate and regular rhythm.  Pulmonary/Chest: Breath sounds normal. No respiratory distress. She has no wheezes.  Abdominal: Soft. Bowel sounds are normal. There is no tenderness.  Musculoskeletal: She  exhibits no edema or tenderness.  Lymphadenopathy:    She has no cervical adenopathy.  Skin: No rash noted. No erythema.  Psychiatric: She has a normal mood and affect. Her behavior is normal.    BP 136/72 (BP Location: Left Arm, Patient Position: Sitting, Cuff Size: Normal)   Pulse 65   Temp 98.1 F (36.7 C) (Oral)   Resp 18   Wt 207 lb (93.9 kg)   LMP 08/11/1995   SpO2 97%   BMI 40.43 kg/m  Wt Readings from Last 3 Encounters:  04/06/18 207 lb (93.9 kg)  09/21/17 210 lb 9.6 oz (95.5 kg)  07/06/17 205 lb 12.8 oz (93.4 kg)  Lab Results  Component Value Date   WBC 7.4 12/19/2017   HGB 13.9 12/19/2017   HCT 41.5 12/19/2017   PLT 304 12/19/2017   GLUCOSE 115 (H) 12/19/2017   CHOL 208 (H) 12/19/2017   TRIG 174 (H) 12/19/2017   HDL 48 12/19/2017   LDLCALC 125 (H) 12/19/2017   ALT 16 12/19/2017   AST 19 12/19/2017   NA 144 12/19/2017   K 5.0 12/19/2017   CL 101 12/19/2017   CREATININE 1.03 (H) 12/19/2017   BUN 24 12/19/2017   CO2 28 12/19/2017   TSH 0.851 03/28/2017   INR 0.9 12/19/2011   HGBA1C 6.9 (H) 12/19/2017       Assessment & Plan:   Problem List Items Addressed This Visit    Diabetes mellitus ([place])    Low carb diet and exercise.  Discussed diet and exercise today. She is trying to watch what she eats.  Follow met b and a1c.        Relevant Orders   Hemoglobin C1J   Basic metabolic panel   Hypercholesterolemia    On pravastatin.  Low cholesterol diet and exercise.  Follow lipid panel and liver function tests.        Relevant Orders   Hepatic function panel   Lipid panel   Hypertension    Blood pressure stable.  Continue current medication regimen.  Follow pressures.  Follow metabolic panel.        Hypothyroidism    On thyroid replacement.  Follow tsh.       Relevant Orders   TSH   Renal insufficiency    Stay hydrated.  Avoid antiinflammatories.  Follow metabolic panel.        Vitamin D deficiency    Follow vitamin D level.           Other Visit Diagnoses    History of chicken pox    -  Primary   Relevant Orders   Varicella zoster antibody, IgG       Einar Pheasant, MD

## 2018-04-09 ENCOUNTER — Encounter: Payer: Self-pay | Admitting: Internal Medicine

## 2018-04-09 NOTE — Assessment & Plan Note (Signed)
On pravastatin.  Low cholesterol diet and exercise.  Follow lipid panel and liver function tests.   

## 2018-04-09 NOTE — Assessment & Plan Note (Signed)
Follow vitamin D level.  

## 2018-04-09 NOTE — Assessment & Plan Note (Signed)
Blood pressure stable.  Continue current medication regimen.  Follow pressures.  Follow metabolic panel.   

## 2018-04-09 NOTE — Assessment & Plan Note (Signed)
Stay hydrated.  Avoid antiinflammatories.  Follow metabolic panel.   

## 2018-04-09 NOTE — Assessment & Plan Note (Signed)
Low carb diet and exercise.  Discussed diet and exercise today. She is trying to watch what she eats.  Follow met b and a1c.

## 2018-04-09 NOTE — Assessment & Plan Note (Signed)
On thyroid replacement.  Follow tsh.  

## 2018-04-21 LAB — TSH: TSH: 1.23 u[IU]/mL (ref 0.450–4.500)

## 2018-04-21 LAB — LIPID PANEL
CHOL/HDL RATIO: 4.4 ratio (ref 0.0–4.4)
Cholesterol, Total: 199 mg/dL (ref 100–199)
HDL: 45 mg/dL (ref >39)
LDL CALC: 121 mg/dL — AB (ref 0–99)
Triglycerides: 165 mg/dL — ABNORMAL HIGH (ref 0–149)
VLDL CHOLESTEROL CAL: 33 mg/dL (ref 5–40)

## 2018-04-21 LAB — BASIC METABOLIC PANEL
BUN / CREAT RATIO: 43 — AB (ref 12–28)
BUN: 50 mg/dL — ABNORMAL HIGH (ref 8–27)
CHLORIDE: 97 mmol/L (ref 96–106)
CO2: 26 mmol/L (ref 20–29)
CREATININE: 1.17 mg/dL — AB (ref 0.57–1.00)
Calcium: 10.8 mg/dL — ABNORMAL HIGH (ref 8.7–10.3)
GFR calc Af Amer: 52 mL/min/{1.73_m2} — ABNORMAL LOW (ref >59)
GFR calc non Af Amer: 45 mL/min/{1.73_m2} — ABNORMAL LOW (ref >59)
GLUCOSE: 142 mg/dL — AB (ref 65–99)
Potassium: 6.2 mmol/L — ABNORMAL HIGH (ref 3.5–5.2)
SODIUM: 138 mmol/L (ref 134–144)

## 2018-04-21 LAB — HEPATIC FUNCTION PANEL
ALBUMIN: 4.9 g/dL — AB (ref 3.5–4.8)
ALT: 14 IU/L (ref 0–32)
AST: 13 IU/L (ref 0–40)
Alkaline Phosphatase: 40 IU/L (ref 39–117)
BILIRUBIN TOTAL: 0.4 mg/dL (ref 0.0–1.2)
BILIRUBIN, DIRECT: 0.1 mg/dL (ref 0.00–0.40)
TOTAL PROTEIN: 7.3 g/dL (ref 6.0–8.5)

## 2018-04-21 LAB — VARICELLA ZOSTER ANTIBODY, IGG: Varicella zoster IgG: 262 index (ref >165)

## 2018-04-21 LAB — HEMOGLOBIN A1C
ESTIMATED AVERAGE GLUCOSE: 160 mg/dL
Hgb A1c MFr Bld: 7.2 % — ABNORMAL HIGH (ref 4.8–5.6)

## 2018-04-23 ENCOUNTER — Other Ambulatory Visit
Admission: RE | Admit: 2018-04-23 | Discharge: 2018-04-23 | Disposition: A | Discharge status: Home / Self Care | Payer: 59 | Source: Ambulatory Visit | Attending: Internal Medicine | Admitting: Internal Medicine

## 2018-04-23 ENCOUNTER — Telehealth: Payer: Self-pay | Admitting: Internal Medicine

## 2018-04-23 DIAGNOSIS — E875 Hyperkalemia: Secondary | ICD-10-CM

## 2018-04-23 LAB — POTASSIUM: Potassium: 4.7 mmol/L (ref 3.5–5.1)

## 2018-04-23 NOTE — Telephone Encounter (Signed)
Pt notified of lab results.  Not taking any potassium supplements.  Not using an increased amount of salt substitutes.  Will hold spironolactone/hctz.  Stay hydrated.  Stat potassium today.  Orders placed for stat labs.

## 2018-04-23 NOTE — Addendum Note (Signed)
Addended by: Charm BargesSCOTT, Besnik Febus S on: 04/23/2018 04:38 PM   Modules accepted: Orders

## 2018-04-23 NOTE — Telephone Encounter (Signed)
I had placed the order for the potassium to be run stat.  Please call [facility] and get results.  Needs asap.

## 2018-04-23 NOTE — Telephone Encounter (Signed)
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

## 2018-04-23 NOTE — Telephone Encounter (Signed)
Discussed with pt.  She will go to hospital now for f/u stat potassium check.  Order placed for potassium to be checked at hospital (stat) and to call me with results.

## 2018-04-23 NOTE — Telephone Encounter (Signed)
Patient did not have the lab drawn as STAT. The branch she had labs done at had already sent off the specimen. Attempted to upgrade lab to STAT but customer service at [facility] stated that they would not be able to result until tomorrow morning. Advised that results should be faxed to our office ASAP.

## 2018-04-23 NOTE — Telephone Encounter (Signed)
We have not received these labs yet

## 2018-04-24 LAB — POTASSIUM: POTASSIUM: 5 mmol/L (ref 3.5–5.2)

## 2018-04-24 LAB — CALCIUM: Calcium: 10.3 mg/dL (ref 8.7–10.3)

## 2018-06-18 ENCOUNTER — Other Ambulatory Visit: Payer: Self-pay | Admitting: Internal Medicine

## 2018-07-10 NOTE — Discharge Instructions (Signed)
°  Instructions after Total Knee Replacement ° ° Lorraine Foley P. Lorraine Foley, Jr., M.D.    ° Dept. of Orthopaedics & Sports Medicine ° [facility] ° [address] ° [place], [place]  [address] ° Phone: [phone]   Fax: [phone] ° °  °DIET: °• Drink plenty of non-alcoholic fluids. °• Resume your normal diet. Include foods high in fiber. ° °ACTIVITY:  °• You may use crutches or a walker with weight-bearing as tolerated, unless instructed otherwise. °• You may be weaned off of the walker or crutches by your Physical Therapist.  °• Do NOT place pillows under the knee. Anything placed under the knee could limit your ability to straighten the knee.   °• Continue doing gentle exercises. Exercising will reduce the pain and swelling, increase motion, and prevent muscle weakness.   °• Please continue to use the TED compression stockings for 6 weeks. You may remove the stockings at night, but should reapply them in the morning. °• Do not drive or operate any equipment until instructed. ° °WOUND CARE:  °• Continue to use the PolarCare or ice packs periodically to reduce pain and swelling. °• You may bathe or shower after the staples are removed at the first office visit following surgery. ° °MEDICATIONS: °• You may resume your regular medications. °• Please take the pain medication as prescribed on the medication. °• Do not take pain medication on an empty stomach. °• You have been given a prescription for a blood thinner (Lovenox or Coumadin). Please take the medication as instructed. (NOTE: After completing a 2 week course of Lovenox, take one Enteric-coated aspirin once a day. This along with elevation will help reduce the possibility of phlebitis in your operated leg.) °• Do not drive or drink alcoholic beverages when taking pain medications. ° °CALL THE OFFICE FOR: °• Temperature above 101 degrees °• Excessive bleeding or drainage on the dressing. °• Excessive swelling, coldness, or paleness of the toes. °• Persistent  nausea and vomiting. ° °FOLLOW-UP:  °• You should have an appointment to return to the office in 10-14 days after surgery. °• Arrangements have been made for continuation of Physical Therapy (either home therapy or outpatient therapy). °  °

## 2018-07-19 ENCOUNTER — Ambulatory Visit (INDEPENDENT_AMBULATORY_CARE_PROVIDER_SITE_OTHER): Payer: 59 | Admitting: Internal Medicine

## 2018-07-19 ENCOUNTER — Encounter: Payer: Self-pay | Admitting: Internal Medicine

## 2018-07-19 VITALS — BP 140/76 | HR 68 | Temp 97.6°F | Resp 18 | Ht 60.0 in | Wt 204.4 lb

## 2018-07-19 DIAGNOSIS — I10 Essential (primary) hypertension: Secondary | ICD-10-CM

## 2018-07-19 DIAGNOSIS — Z01818 Encounter for other preprocedural examination: Secondary | ICD-10-CM

## 2018-07-19 DIAGNOSIS — N289 Disorder of kidney and ureter, unspecified: Secondary | ICD-10-CM

## 2018-07-19 DIAGNOSIS — E039 Hypothyroidism, unspecified: Secondary | ICD-10-CM

## 2018-07-19 DIAGNOSIS — E119 Type 2 diabetes mellitus without complications: Secondary | ICD-10-CM

## 2018-07-19 DIAGNOSIS — E7800 Pure hypercholesterolemia, unspecified: Secondary | ICD-10-CM

## 2018-07-19 DIAGNOSIS — E559 Vitamin D deficiency, unspecified: Secondary | ICD-10-CM

## 2018-07-19 NOTE — Progress Notes (Signed)
Patient ID: Lorraine Foley, female   DOB: 08/17/1941, 77 y.o.   MRN: [id number]   Subjective:    Patient ID: Lorraine Foley, female    DOB: 10/27/1940, 77 y.o.   MRN: [id number]  HPI  Patient here for a scheduled follow up.  Was just evaluated by ortho.  Planning for knee surgery 08/06/18.  He placed her on celebrex.  Taking bid.  Discussed risk of medication and discussed possible side effects.  Helps with her pain.  Discussed if staying on celebrex, trying to take least effective dose.  States he has been doing well other than her knee.  No chest pain.  No sob.  No acid reflux.  No abdominal pain.  Bowels moving.  No urine change.  States blood pressures averaging 122-142/64-70s.  Reviewed outside blood sugar readings - averaging 105-140.     Past Medical History:  Diagnosis Date  . Arthritis    knees  . Diabetes mellitus ([facility])   . GERD (gastroesophageal reflux disease)   . Glaucoma   . Hypercholesterolemia   . Hypertension   . Hypothyroidism    Past Surgical History:  Procedure Laterality Date  . CATARACT EXTRACTION W/PHACO Left 11/09/2016   Procedure: CATARACT EXTRACTION PHACO AND INTRAOCULAR LENS PLACEMENT ([facility])  left diabetic complicated;  Surgeon: Chadwick Brasington, MD;  Location: [facility];  Service: Ophthalmology;  Laterality: Left;  Diabetic - oral meds  . CATARACT EXTRACTION W/PHACO Right 03/08/2017   Procedure: CATARACT EXTRACTION PHACO AND INTRAOCULAR LENS PLACEMENT ([facility])  Right Diabetic complicated;  Surgeon: Brasington, Chadwick, MD;  Location: [facility];  Service: Ophthalmology;  Laterality: Right;  . EYE SURGERY Bilateral 08/2014, 2.2016   laser surgery in preparation for glaucoma surgery  . TOTAL HIP ARTHROPLASTY  5/13   right   Family History  Problem Relation Age of Onset  . Mental illness Father        suicide  . Liver disease Father        alcohol  . Glaucoma Father   . Alcohol abuse Father   . Blindness Father   . Glaucoma Mother   .  Osteoporosis Mother   . Diabetes Sister   . Breast cancer Sister 48  . Colon cancer Neg Hx    Social History   Socioeconomic History  . Marital status: Divorced    Spouse name: Not on file  . Number of children: 1  . Years of education: Not on file  . Highest education level: Not on file  Occupational History  . Not on file  Social Needs  . Financial resource strain: Not on file  . Food insecurity:    Worry: Not on file    Inability: Not on file  . Transportation needs:    Medical: Not on file    Non-medical: Not on file  Tobacco Use  . Smoking status: Never Smoker  . Smokeless tobacco: Never Used  Substance and Sexual Activity  . Alcohol use: No    Alcohol/week: 0.0 standard drinks  . Drug use: No  . Sexual activity: Not on file  Lifestyle  . Physical activity:    Days per week: Not on file    Minutes per session: Not on file  . Stress: Not on file  Relationships  . Social connections:    Talks on phone: Not on file    Gets together: Not on file    Attends religious service: Not on file    Active member of club or   organization: Not on file    Attends meetings of clubs or organizations: Not on file    Relationship status: Not on file  Other Topics Concern  . Not on file  Social History Narrative  . Not on file    Outpatient Encounter Medications as of 07/19/2018  Medication Sig  . acetaminophen (TYLENOL) 650 MG CR tablet Take 650 mg by mouth daily as needed for pain.  Marland Kitchen amLODipine (NORVASC) 5 MG tablet TAKE 1 TABLET BY MOUTH TWO  TIMES DAILY (Patient taking differently: Take 5 mg by mouth daily. )  . aspirin 81 MG tablet Take 81 mg by mouth at bedtime.   . Cholecalciferol (VITAMIN D-3) 1000 UNITS CAPS Take 1 capsule by mouth daily.  . cloNIDine (CATAPRES) 0.1 MG tablet TAKE 1 TABLET BY MOUTH 3  TIMES A DAY (Patient taking differently: Take 0.1 mg by mouth 3 (three) times daily. )  . glucosamine-chondroitin 500-400 MG tablet Take 1 tablet by mouth daily.  Marland Kitchen  glucose blood test strip Use as instructed to check blood sugars once daily. Dx E11.9  . Lancets (ONETOUCH ULTRASOFT) lancets Use as instructed to check blood sugars once daily. Dx E11.9  . latanoprost (XALATAN) 0.005 % ophthalmic solution Place 1 drop into both eyes at bedtime. As directed   . levothyroxine (SYNTHROID, LEVOTHROID) 100 MCG tablet TAKE 1 TABLET BY MOUTH  DAILY (Patient taking differently: Take 100 mcg by mouth daily before breakfast. )  . OVER THE COUNTER MEDICATION Take 1 tablet by mouth daily. OSTEO-SHEATH  . pravastatin (PRAVACHOL) 40 MG tablet TAKE 2 TABLETS BY MOUTH  DAILY (Patient taking differently: Take 40 mg by mouth daily. )  . quinapril (ACCUPRIL) 40 MG tablet TAKE 1 TABLET BY MOUTH TWO  TIMES DAILY  . spironolactone-hydrochlorothiazide (ALDACTAZIDE) 25-25 MG tablet TAKE 1 TABLET BY MOUTH  DAILY (Patient taking differently: Take 1 tablet by mouth daily. )  . timolol (TIMOPTIC) 0.5 % ophthalmic solution Place 1 drop into both eyes 2 (two) times daily.   . Turmeric 500 MG CAPS Take 1 capsule by mouth daily.  . vitamin C (ASCORBIC ACID) 500 MG tablet Take 500 mg by mouth daily.   No facility-administered encounter medications on file as of 07/19/2018.     Review of Systems  Constitutional: Negative for appetite change and unexpected weight change.  HENT: Negative for congestion and sinus pressure.   Respiratory: Negative for cough, chest tightness and shortness of breath.   Cardiovascular: Negative for chest pain, palpitations and leg swelling.  Gastrointestinal: Negative for abdominal pain, diarrhea, nausea and vomiting.  Genitourinary: Negative for difficulty urinating and dysuria.  Musculoskeletal: Negative for myalgias.       Knee pain as outlined.  Planning for surgery.    Skin: Negative for color change and rash.  Neurological: Negative for dizziness, light-headedness and headaches.  Psychiatric/Behavioral: Negative for agitation and dysphoric mood.         Objective:    Physical Exam  Constitutional: She appears well-developed and well-nourished. No distress.  HENT:  Nose: Nose normal.  Mouth/Throat: Oropharynx is clear and moist.  Neck: Neck supple. No thyromegaly present.  Cardiovascular: Normal rate and regular rhythm.  Pulmonary/Chest: Breath sounds normal. No respiratory distress. She has no wheezes.  Abdominal: Soft. Bowel sounds are normal. There is no tenderness.  Musculoskeletal: She exhibits no edema or tenderness.  Lymphadenopathy:    She has no cervical adenopathy.  Skin: No rash noted. No erythema.  Psychiatric: She has a normal mood and  affect. Her behavior is normal.    BP 140/76 (BP Location: Left Arm, Patient Position: Sitting, Cuff Size: Normal)   Pulse 68   Temp 97.6 F (36.4 C) (Oral)   Resp 18   Ht 5' (1.524 m)   Wt 204 lb 6.4 oz (92.7 kg)   LMP 08/11/1995   SpO2 97%   BMI 39.92 kg/m  Wt Readings from Last 3 Encounters:  07/19/18 204 lb 6.4 oz (92.7 kg)  04/06/18 207 lb (93.9 kg)  09/21/17 210 lb 9.6 oz (95.5 kg)     Lab Results  Component Value Date   WBC 7.4 12/19/2017   HGB 13.9 12/19/2017   HCT 41.5 12/19/2017   PLT 304 12/19/2017   GLUCOSE 142 (H) 04/20/2018   CHOL 199 04/20/2018   TRIG 165 (H) 04/20/2018   HDL 45 04/20/2018   LDLCALC 121 (H) 04/20/2018   ALT 14 04/20/2018   AST 13 04/20/2018   NA 138 04/20/2018   K 4.7 04/23/2018   CL 97 04/20/2018   CREATININE 1.17 (H) 04/20/2018   BUN 50 (H) 04/20/2018   CO2 26 04/20/2018   TSH 1.230 04/20/2018   INR 0.9 12/19/2011   HGBA1C 7.2 (H) 04/20/2018       Assessment & Plan:   Problem List Items Addressed This Visit    Diabetes mellitus ([place])    Sugars as outlined.  Low carb diet and exercise.  Follow met b and a1c.  She has adjusted her diet.        Hypercholesterolemia    On pravastatin.  Low cholesterol diet and exercise.  Follow lipid panel and liver function tests.        Hypertension    Blood pressure has been under  reasonable control.  Continue same medication regimen.  Follow pressures.  Follow metabolic panel.  Has ordered through ortho.  Will need close intra op and post op monitoring of her heart rate and blood pressure to avoid extremes.        Hypothyroidism    On thyroid replacement.  Follow tsh.       Pre-op evaluation - Primary    Planning for knee surgery.  Currently without chest pain or sob.  EKG - SR with no acute ischemic changes.  I feel she is at low risk from a cardiac standpoint to proceed with planned surgery.  Sugars appear to be doing better.  a1c scheduled to be checked.  Try to avoid antiinflammatories.  Follow renal function.  Will need close intra op and post op monitoring of her heart rate and blood pressure to avoid extremes.        Relevant Orders   EKG 12-Lead (Completed)   Renal insufficiency    Stay hydrated.  Discussed with her regarding avoiding antiinflammatories.  On celebrex.  Will try to decrease dose.  Follow metabolic panel.        Vitamin D deficiency    Follow vitamin D level.            Einar Pheasant, MD

## 2018-07-22 ENCOUNTER — Encounter: Payer: Self-pay | Admitting: Internal Medicine

## 2018-07-22 NOTE — Assessment & Plan Note (Signed)
Blood pressure has been under reasonable control.  Continue same medication regimen.  Follow pressures.  Follow metabolic panel.  Has ordered through ortho.  Will need close intra op and post op monitoring of her heart rate and blood pressure to avoid extremes.

## 2018-07-22 NOTE — Assessment & Plan Note (Signed)
Stay hydrated.  Discussed with her regarding avoiding antiinflammatories.  On celebrex.  Will try to decrease dose.  Follow metabolic panel.

## 2018-07-22 NOTE — Assessment & Plan Note (Signed)
Sugars as outlined.  Low carb diet and exercise.  Follow met b and a1c.  She has adjusted her diet.

## 2018-07-22 NOTE — Assessment & Plan Note (Signed)
Follow vitamin D level.  

## 2018-07-22 NOTE — Assessment & Plan Note (Signed)
On thyroid replacement.  Follow tsh.  

## 2018-07-22 NOTE — Assessment & Plan Note (Signed)
Planning for knee surgery.  Currently without chest pain or sob.  EKG - SR with no acute ischemic changes.  I feel she is at low risk from a cardiac standpoint to proceed with planned surgery.  Sugars appear to be doing better.  a1c scheduled to be checked.  Try to avoid antiinflammatories.  Follow renal function.  Will need close intra op and post op monitoring of her heart rate and blood pressure to avoid extremes.

## 2018-07-22 NOTE — Assessment & Plan Note (Signed)
On pravastatin.  Low cholesterol diet and exercise.  Follow lipid panel and liver function tests.   

## 2018-07-24 ENCOUNTER — Encounter
Admission: RE | Admit: 2018-07-24 | Discharge: 2018-07-24 | Disposition: A | Discharge status: Home / Self Care | Payer: 59 | Source: Ambulatory Visit | Attending: Orthopedic Surgery | Admitting: Orthopedic Surgery

## 2018-07-24 ENCOUNTER — Other Ambulatory Visit: Payer: Self-pay

## 2018-07-24 DIAGNOSIS — Z01812 Encounter for preprocedural laboratory examination: Secondary | ICD-10-CM | POA: Diagnosis not present

## 2018-07-24 HISTORY — DX: Pneumonia, unspecified organism: J18.9

## 2018-07-24 LAB — URINALYSIS, ROUTINE W REFLEX MICROSCOPIC
Bacteria, UA: NONE SEEN
Bilirubin Urine: NEGATIVE
GLUCOSE, UA: NEGATIVE mg/dL
HGB URINE DIPSTICK: NEGATIVE
Ketones, ur: 5 mg/dL — AB
Nitrite: NEGATIVE
PROTEIN: NEGATIVE mg/dL
Specific Gravity, Urine: 1.008 (ref 1.005–1.030)
pH: 5 (ref 5.0–8.0)

## 2018-07-24 LAB — SURGICAL PCR SCREEN
MRSA, PCR: NEGATIVE
STAPHYLOCOCCUS AUREUS: POSITIVE — AB

## 2018-07-24 LAB — COMPREHENSIVE METABOLIC PANEL
ALK PHOS: 36 U/L — AB (ref 38–126)
ALT: 15 U/L (ref 0–44)
AST: 17 U/L (ref 15–41)
Albumin: 4.6 g/dL (ref 3.5–5.0)
Anion gap: 11 (ref 5–15)
BUN: 37 mg/dL — AB (ref 8–23)
CALCIUM: 9.8 mg/dL (ref 8.9–10.3)
CO2: 29 mmol/L (ref 22–32)
CREATININE: 1.16 mg/dL — AB (ref 0.44–1.00)
Chloride: 99 mmol/L (ref 98–111)
GFR calc Af Amer: 53 mL/min — ABNORMAL LOW (ref >60)
GFR calc non Af Amer: 45 mL/min — ABNORMAL LOW (ref >60)
Glucose, Bld: 130 mg/dL — ABNORMAL HIGH (ref 70–99)
Potassium: 4.2 mmol/L (ref 3.5–5.1)
SODIUM: 139 mmol/L (ref 135–145)
Total Bilirubin: 1 mg/dL (ref 0.3–1.2)
Total Protein: 7.6 g/dL (ref 6.5–8.1)

## 2018-07-24 LAB — APTT: APTT: 30 s (ref 24–36)

## 2018-07-24 LAB — CBC
HCT: 40 % (ref 36.0–46.0)
HEMOGLOBIN: 13.6 g/dL (ref 12.0–15.0)
MCH: 30.4 pg (ref 26.0–34.0)
MCHC: 34 g/dL (ref 30.0–36.0)
MCV: 89.3 fL (ref 80.0–100.0)
Platelets: 261 10*3/uL (ref 150–400)
RBC: 4.48 MIL/uL (ref 3.87–5.11)
RDW: 12.4 % (ref 11.5–15.5)
WBC: 6.7 10*3/uL (ref 4.0–10.5)
nRBC: 0 % (ref 0.0–0.2)

## 2018-07-24 LAB — C-REACTIVE PROTEIN

## 2018-07-24 LAB — HEMOGLOBIN A1C
HEMOGLOBIN A1C: 6.3 % — AB (ref 4.8–5.6)
MEAN PLASMA GLUCOSE: 134.11 mg/dL

## 2018-07-24 LAB — TYPE AND SCREEN
ABO/RH(D): O NEG
Antibody Screen: NEGATIVE

## 2018-07-24 LAB — SEDIMENTATION RATE: Sed Rate: 13 mm/hr (ref 0–30)

## 2018-07-24 LAB — HM DIABETES EYE EXAM

## 2018-07-24 LAB — PROTIME-INR
INR: 1
Prothrombin Time: 13.1 seconds (ref 11.4–15.2)

## 2018-07-24 NOTE — Patient Instructions (Addendum)
  Your procedure is scheduled on: Monday August 06, 2018 Report to Same Day Surgery 2nd floor Medical Mall [facility](Medical Mall Entrance-take elevator on left to 2nd floor.  Check in with surgery information desk.) To find out your arrival time, call [phone] 1:00-3:00 PM on Friday August 03, 2018  Remember: Instructions that are not followed completely may result in serious medical risk, up to and including death, or upon the discretion of your surgeon and anesthesiologist your surgery may need to be rescheduled.    __x__ 1. Do not eat food (including mints, candies, chewing gum) after midnight the night before your procedure. You may drink water up to 2 hours before you are scheduled to arrive at the hospital for your procedure.  Do not drink anything within 2 hours of your scheduled arrival to the hospital.   __x__ 2. No Alcohol for 24 hours before or after surgery.   __x__ 3. No Smoking or e-cigarettes for 24 hours before surgery.  Do not use any chewable tobacco products for at least 6 hours before surgery.   __x__ 4. Notify your doctor if there is any change in your medical condition (cold, fever, infections).   __x__ 5. On the morning of surgery brush your teeth with toothpaste and water.  You may rinse your mouth with mouthwash if you wish.  Do not swallow any toothpaste or mouthwash.  Please read over the following fact sheets that you were given:   [facility] Pre[facility] for [facility] and/or MRSA Information    __x__ Use CHG Soap or Sage wipes as directed on instruction sheet   Do not wear jewelry, make-up, hairpins, clips or nail polish on the day of surgery.  Do not wear lotions, powders, deodorant, or perfumes.   Do not shave below the face/neck 48 hours prior to surgery.   Do not bring valuables to the hospital.    [facility] is not responsible for any belongings or valuables.               Contacts, dentures or bridgework may not be worn into surgery.  Leave your suitcase  in the car. After surgery it may be brought to your room.  For patients admitted to the hospital, discharge time is determined by your treatment team.  __x__ On the morning of surgery, take the following medicines with a SMALL SIP OF WATER:  1. Amlodipine/Norvasc  2. Clonidine/Catapres  3. Levothyroxine/Synthroid  Do NOT take your Quinapril/Accupril or Spironolactone-HCTZ on the morning of surgery.  __x__ Follow recommendations from Cardiologist, Pulmonologist or PCP regarding stopping Aspirin, Coumadin, Plavix, Eliquis, Effient, Pradaxa, and Pletal.  __x__ At least 7 days prior to surgery (Last dose Sunday July 29, 2018): Stop Anti-inflammatories such as aspirin, Advil, Ibuprofen, Motrin, Aleve, Naproxen, Naprosyn, BC/Goodies powders or aspirin products. You may continue to take Tylenol and Celebrex.   __x__ TODAY: Stop supplements (Glucosamine, Osteosheath, Turmeric, Vitamin C) until after surgery. You may continue to take your Vitamin D.

## 2018-07-26 LAB — URINE CULTURE
Culture: NO GROWTH
Special Requests: NORMAL

## 2018-08-05 MED ORDER — TRANEXAMIC ACID-NACL 1000-0.7 MG/100ML-% IV SOLN
1000.0000 mg | INTRAVENOUS | Status: DC
  Filled 2018-08-05: qty 100

## 2018-08-05 MED ORDER — CEFAZOLIN SODIUM-DEXTROSE 2-4 GM/100ML-% IV SOLN: 2.0000 g | INTRAVENOUS | Status: DC

## 2018-08-06 ENCOUNTER — Telehealth: Payer: Self-pay

## 2018-08-06 ENCOUNTER — Inpatient Hospital Stay: Payer: 59 | Admitting: Anesthesiology

## 2018-08-06 ENCOUNTER — Encounter: Payer: Self-pay | Admitting: Orthopedic Surgery

## 2018-08-06 ENCOUNTER — Other Ambulatory Visit: Payer: Self-pay

## 2018-08-06 ENCOUNTER — Inpatient Hospital Stay
Admission: RE | Admit: 2018-08-06 | Discharge: 2018-08-08 | DRG: 470 | Disposition: A | Discharge status: Home Health | Payer: 59 | Source: Ambulatory Visit | Attending: Orthopedic Surgery | Admitting: Orthopedic Surgery

## 2018-08-06 ENCOUNTER — Encounter
Admission: RE | Disposition: A | Discharge status: Home Health | Payer: Self-pay | Source: Ambulatory Visit | Attending: Orthopedic Surgery

## 2018-08-06 ENCOUNTER — Inpatient Hospital Stay: Payer: 59

## 2018-08-06 DIAGNOSIS — E039 Hypothyroidism, unspecified: Secondary | ICD-10-CM | POA: Diagnosis present

## 2018-08-06 DIAGNOSIS — I10 Essential (primary) hypertension: Secondary | ICD-10-CM | POA: Diagnosis present

## 2018-08-06 DIAGNOSIS — M1712 Unilateral primary osteoarthritis, left knee: Secondary | ICD-10-CM | POA: Diagnosis present

## 2018-08-06 DIAGNOSIS — Z833 Family history of diabetes mellitus: Secondary | ICD-10-CM | POA: Diagnosis not present

## 2018-08-06 DIAGNOSIS — Z6839 Body mass index (BMI) 39.0-39.9, adult: Secondary | ICD-10-CM

## 2018-08-06 DIAGNOSIS — M25562 Pain in left knee: Secondary | ICD-10-CM | POA: Diagnosis present

## 2018-08-06 DIAGNOSIS — Z79890 Hormone replacement therapy (postmenopausal): Secondary | ICD-10-CM

## 2018-08-06 DIAGNOSIS — H409 Unspecified glaucoma: Secondary | ICD-10-CM | POA: Diagnosis present

## 2018-08-06 DIAGNOSIS — E7800 Pure hypercholesterolemia, unspecified: Secondary | ICD-10-CM | POA: Diagnosis present

## 2018-08-06 DIAGNOSIS — Z96651 Presence of right artificial knee joint: Secondary | ICD-10-CM | POA: Diagnosis present

## 2018-08-06 DIAGNOSIS — K219 Gastro-esophageal reflux disease without esophagitis: Secondary | ICD-10-CM | POA: Diagnosis present

## 2018-08-06 DIAGNOSIS — Z79899 Other long term (current) drug therapy: Secondary | ICD-10-CM

## 2018-08-06 DIAGNOSIS — E119 Type 2 diabetes mellitus without complications: Secondary | ICD-10-CM | POA: Diagnosis present

## 2018-08-06 DIAGNOSIS — E785 Hyperlipidemia, unspecified: Secondary | ICD-10-CM | POA: Diagnosis present

## 2018-08-06 DIAGNOSIS — Z96659 Presence of unspecified artificial knee joint: Secondary | ICD-10-CM

## 2018-08-06 DIAGNOSIS — Z888 Allergy status to other drugs, medicaments and biological substances status: Secondary | ICD-10-CM

## 2018-08-06 HISTORY — PX: KNEE ARTHROPLASTY: SHX992

## 2018-08-06 LAB — GLUCOSE, CAPILLARY
GLUCOSE-CAPILLARY: 113 mg/dL — AB (ref 70–99)
Glucose-Capillary: 135 mg/dL — ABNORMAL HIGH (ref 70–99)
Glucose-Capillary: 146 mg/dL — ABNORMAL HIGH (ref 70–99)
Glucose-Capillary: 191 mg/dL — ABNORMAL HIGH (ref 70–99)

## 2018-08-06 LAB — ABO/RH: ABO/RH(D): O NEG

## 2018-08-06 SURGERY — ARTHROPLASTY, KNEE, TOTAL, USING IMAGELESS COMPUTER-ASSISTED NAVIGATION
Anesthesia: Choice | Site: Knee | Laterality: Left

## 2018-08-06 MED ORDER — MIDAZOLAM HCL 5 MG/5ML IJ SOLN
INTRAMUSCULAR | Status: DC | PRN
  Administered 2018-08-06: 0.5 mg via INTRAVENOUS

## 2018-08-06 MED ORDER — SPIRONOLACTONE 25 MG PO TABS
25.0000 mg | ORAL_TABLET | Freq: Every day | ORAL | Status: DC
  Administered 2018-08-06 – 2018-08-08 (×3): 25 mg via ORAL
  Filled 2018-08-06 (×3): qty 1

## 2018-08-06 MED ORDER — VITAMIN D 25 MCG (1000 UNIT) PO TABS
1000.0000 [IU] | ORAL_TABLET | Freq: Every day | ORAL | Status: DC
  Administered 2018-08-07 – 2018-08-08 (×2): 1000 [IU] via ORAL
  Filled 2018-08-06 (×2): qty 1

## 2018-08-06 MED ORDER — ACETAMINOPHEN 500 MG PO TABS: 500.0000 mg | ORAL_TABLET | Freq: Every evening | ORAL | Status: DC | PRN

## 2018-08-06 MED ORDER — DIPHENHYDRAMINE-APAP (SLEEP) 25-500 MG PO TABS: 1.0000 | ORAL_TABLET | Freq: Every evening | ORAL | Status: DC | PRN

## 2018-08-06 MED ORDER — GABAPENTIN 300 MG PO CAPS
300.0000 mg | ORAL_CAPSULE | Freq: Once | ORAL | Status: AC
  Administered 2018-08-06: 300 mg via ORAL

## 2018-08-06 MED ORDER — PROPOFOL 10 MG/ML IV BOLUS
INTRAVENOUS | Status: DC | PRN
  Administered 2018-08-06 (×4): 18 mg via INTRAVENOUS

## 2018-08-06 MED ORDER — FAMOTIDINE 20 MG PO TABS
20.0000 mg | ORAL_TABLET | Freq: Once | ORAL | Status: AC
  Administered 2018-08-06: 20 mg via ORAL

## 2018-08-06 MED ORDER — LATANOPROST 0.005 % OP SOLN
1.0000 [drp] | Freq: Every day | OPHTHALMIC | Status: DC
  Administered 2018-08-06 – 2018-08-07 (×2): 1 [drp] via OPHTHALMIC
  Filled 2018-08-06: qty 2.5

## 2018-08-06 MED ORDER — LEVOTHYROXINE SODIUM 100 MCG PO TABS
100.0000 ug | ORAL_TABLET | Freq: Every day | ORAL | Status: DC
  Administered 2018-08-07 – 2018-08-08 (×2): 100 ug via ORAL
  Filled 2018-08-06 (×2): qty 1

## 2018-08-06 MED ORDER — SENNOSIDES-DOCUSATE SODIUM 8.6-50 MG PO TABS
1.0000 | ORAL_TABLET | Freq: Two times a day (BID) | ORAL | Status: DC
  Administered 2018-08-06 – 2018-08-07 (×3): 1 via ORAL
  Filled 2018-08-06 (×3): qty 1

## 2018-08-06 MED ORDER — FERROUS SULFATE 325 (65 FE) MG PO TABS
325.0000 mg | ORAL_TABLET | Freq: Two times a day (BID) | ORAL | Status: DC
  Administered 2018-08-06 – 2018-08-08 (×4): 325 mg via ORAL
  Filled 2018-08-06 (×4): qty 1

## 2018-08-06 MED ORDER — FENTANYL CITRATE (PF) 100 MCG/2ML IJ SOLN
INTRAMUSCULAR | Status: AC
  Filled 2018-08-06: qty 2

## 2018-08-06 MED ORDER — BUPIVACAINE HCL (PF) 0.5 % IJ SOLN
INTRAMUSCULAR | Status: AC
  Filled 2018-08-06: qty 10

## 2018-08-06 MED ORDER — FENTANYL CITRATE (PF) 100 MCG/2ML IJ SOLN: 25.0000 ug | INTRAMUSCULAR | Status: DC | PRN

## 2018-08-06 MED ORDER — GLYCOPYRROLATE 0.2 MG/ML IJ SOLN
INTRAMUSCULAR | Status: DC | PRN
  Administered 2018-08-06: 0.2 mg via INTRAVENOUS

## 2018-08-06 MED ORDER — CLONIDINE HCL 0.1 MG PO TABS
0.1000 mg | ORAL_TABLET | Freq: Three times a day (TID) | ORAL | Status: DC
  Administered 2018-08-06 – 2018-08-08 (×3): 0.1 mg via ORAL
  Filled 2018-08-06 (×6): qty 1

## 2018-08-06 MED ORDER — MAGNESIUM HYDROXIDE 400 MG/5ML PO SUSP
30.0000 mL | Freq: Every day | ORAL | Status: DC
  Administered 2018-08-07: 30 mL via ORAL
  Filled 2018-08-06: qty 30

## 2018-08-06 MED ORDER — CELECOXIB 200 MG PO CAPS
200.0000 mg | ORAL_CAPSULE | Freq: Two times a day (BID) | ORAL | Status: DC
  Administered 2018-08-06 – 2018-08-08 (×4): 200 mg via ORAL
  Filled 2018-08-06 (×4): qty 1

## 2018-08-06 MED ORDER — ENOXAPARIN SODIUM 30 MG/0.3ML ~~LOC~~ SOLN
30.0000 mg | Freq: Two times a day (BID) | SUBCUTANEOUS | Status: DC
  Administered 2018-08-07 – 2018-08-08 (×3): 30 mg via SUBCUTANEOUS
  Filled 2018-08-06 (×3): qty 0.3

## 2018-08-06 MED ORDER — PHENOL 1.4 % MT LIQD
1.0000 | OROMUCOSAL | Status: DC | PRN
  Filled 2018-08-06: qty 177

## 2018-08-06 MED ORDER — CEFAZOLIN SODIUM-DEXTROSE 2-3 GM-%(50ML) IV SOLR
INTRAVENOUS | Status: DC | PRN
  Administered 2018-08-06: 2 g via INTRAVENOUS

## 2018-08-06 MED ORDER — AMLODIPINE BESYLATE 5 MG PO TABS
5.0000 mg | ORAL_TABLET | Freq: Every day | ORAL | Status: DC
  Administered 2018-08-07 – 2018-08-08 (×2): 5 mg via ORAL
  Filled 2018-08-06 (×2): qty 1

## 2018-08-06 MED ORDER — CELECOXIB 200 MG PO CAPS
ORAL_CAPSULE | ORAL | Status: AC
  Administered 2018-08-06: 400 mg via ORAL
  Filled 2018-08-06: qty 2

## 2018-08-06 MED ORDER — SODIUM CHLORIDE (PF) 0.9 % IJ SOLN
INTRAMUSCULAR | Status: DC | PRN
  Administered 2018-08-06: 40 mL

## 2018-08-06 MED ORDER — PROPOFOL 500 MG/50ML IV EMUL
INTRAVENOUS | Status: DC | PRN
  Administered 2018-08-06: 50 ug/kg/min via INTRAVENOUS

## 2018-08-06 MED ORDER — TRAMADOL HCL 50 MG PO TABS: 50.0000 mg | ORAL_TABLET | ORAL | Status: DC | PRN

## 2018-08-06 MED ORDER — DEXAMETHASONE SODIUM PHOSPHATE 10 MG/ML IJ SOLN
INTRAMUSCULAR | Status: AC
  Administered 2018-08-06: 8 mg via INTRAVENOUS
  Filled 2018-08-06: qty 1

## 2018-08-06 MED ORDER — SODIUM CHLORIDE 0.9 % IV SOLN
INTRAVENOUS | Status: DC
  Administered 2018-08-06 (×2): via INTRAVENOUS

## 2018-08-06 MED ORDER — TRANEXAMIC ACID-NACL 1000-0.7 MG/100ML-% IV SOLN
INTRAVENOUS | Status: DC | PRN
  Administered 2018-08-06: 1000 mg via INTRAVENOUS

## 2018-08-06 MED ORDER — SODIUM CHLORIDE (PF) 0.9 % IJ SOLN
INTRAMUSCULAR | Status: AC
  Filled 2018-08-06: qty 50

## 2018-08-06 MED ORDER — BUPIVACAINE LIPOSOME 1.3 % IJ SUSP
INTRAMUSCULAR | Status: DC | PRN
  Administered 2018-08-06: 20 mL

## 2018-08-06 MED ORDER — FLEET ENEMA 7-19 GM/118ML RE ENEM: 1.0000 | ENEMA | Freq: Once | RECTAL | Status: DC | PRN

## 2018-08-06 MED ORDER — FAMOTIDINE 20 MG PO TABS
ORAL_TABLET | ORAL | Status: AC
  Administered 2018-08-06: 20 mg via ORAL
  Filled 2018-08-06: qty 1

## 2018-08-06 MED ORDER — CELECOXIB 200 MG PO CAPS
400.0000 mg | ORAL_CAPSULE | Freq: Once | ORAL | Status: AC
  Administered 2018-08-06: 400 mg via ORAL

## 2018-08-06 MED ORDER — METOCLOPRAMIDE HCL 5 MG/ML IJ SOLN: 5.0000 mg | Freq: Three times a day (TID) | INTRAMUSCULAR | Status: DC | PRN

## 2018-08-06 MED ORDER — TRANEXAMIC ACID-NACL 1000-0.7 MG/100ML-% IV SOLN
1000.0000 mg | Freq: Once | INTRAVENOUS | Status: AC
  Administered 2018-08-06: 1000 mg via INTRAVENOUS
  Filled 2018-08-06: qty 100

## 2018-08-06 MED ORDER — ONDANSETRON HCL 4 MG/2ML IJ SOLN: 4.0000 mg | Freq: Once | INTRAMUSCULAR | Status: DC | PRN

## 2018-08-06 MED ORDER — GABAPENTIN 300 MG PO CAPS
300.0000 mg | ORAL_CAPSULE | Freq: Every day | ORAL | Status: DC
  Administered 2018-08-06 – 2018-08-07 (×2): 300 mg via ORAL
  Filled 2018-08-06 (×2): qty 1

## 2018-08-06 MED ORDER — BUPIVACAINE LIPOSOME 1.3 % IJ SUSP
INTRAMUSCULAR | Status: AC
  Filled 2018-08-06: qty 60

## 2018-08-06 MED ORDER — OXYCODONE HCL 5 MG PO TABS: 10.0000 mg | ORAL_TABLET | ORAL | Status: DC | PRN

## 2018-08-06 MED ORDER — SODIUM CHLORIDE 0.9 % IV SOLN
INTRAVENOUS | Status: DC
  Administered 2018-08-06 (×2): via INTRAVENOUS

## 2018-08-06 MED ORDER — HYDROCHLOROTHIAZIDE 25 MG PO TABS
25.0000 mg | ORAL_TABLET | Freq: Every day | ORAL | Status: DC
  Administered 2018-08-06 – 2018-08-08 (×3): 25 mg via ORAL
  Filled 2018-08-06 (×3): qty 1

## 2018-08-06 MED ORDER — ONDANSETRON HCL 4 MG/2ML IJ SOLN: 4.0000 mg | Freq: Four times a day (QID) | INTRAMUSCULAR | Status: DC | PRN

## 2018-08-06 MED ORDER — SALINE SPRAY 0.65 % NA SOLN
2.0000 | Freq: Every day | NASAL | Status: DC
  Administered 2018-08-07 – 2018-08-08 (×2): 2 via NASAL
  Filled 2018-08-06: qty 44

## 2018-08-06 MED ORDER — PANTOPRAZOLE SODIUM 40 MG PO TBEC
40.0000 mg | DELAYED_RELEASE_TABLET | Freq: Two times a day (BID) | ORAL | Status: DC
  Administered 2018-08-06 – 2018-08-08 (×4): 40 mg via ORAL
  Filled 2018-08-06 (×4): qty 1

## 2018-08-06 MED ORDER — TETRACAINE HCL 1 % IJ SOLN
INTRAMUSCULAR | Status: DC | PRN
  Administered 2018-08-06: 4 mg via INTRASPINAL

## 2018-08-06 MED ORDER — SODIUM CHLORIDE 0.9 % IV SOLN
INTRAVENOUS | Status: DC | PRN
  Administered 2018-08-06: 30 ug/min via INTRAVENOUS

## 2018-08-06 MED ORDER — ACETAMINOPHEN 10 MG/ML IV SOLN
INTRAVENOUS | Status: AC
  Filled 2018-08-06: qty 100

## 2018-08-06 MED ORDER — DEXAMETHASONE SODIUM PHOSPHATE 10 MG/ML IJ SOLN
8.0000 mg | Freq: Once | INTRAMUSCULAR | Status: AC
  Administered 2018-08-06: 8 mg via INTRAVENOUS

## 2018-08-06 MED ORDER — ACETAMINOPHEN 325 MG PO TABS
325.0000 mg | ORAL_TABLET | Freq: Four times a day (QID) | ORAL | Status: DC | PRN
  Administered 2018-08-08: 650 mg via ORAL
  Filled 2018-08-06: qty 2

## 2018-08-06 MED ORDER — METOCLOPRAMIDE HCL 10 MG PO TABS
10.0000 mg | ORAL_TABLET | Freq: Three times a day (TID) | ORAL | Status: DC
  Administered 2018-08-06 – 2018-08-08 (×7): 10 mg via ORAL
  Filled 2018-08-06 (×6): qty 1

## 2018-08-06 MED ORDER — METOCLOPRAMIDE HCL 10 MG PO TABS: 5.0000 mg | ORAL_TABLET | Freq: Three times a day (TID) | ORAL | Status: DC | PRN

## 2018-08-06 MED ORDER — BUPIVACAINE HCL (PF) 0.5 % IJ SOLN
INTRAMUSCULAR | Status: DC | PRN
  Administered 2018-08-06: 2.6 mL

## 2018-08-06 MED ORDER — CEFAZOLIN SODIUM-DEXTROSE 2-4 GM/100ML-% IV SOLN
INTRAVENOUS | Status: AC
  Filled 2018-08-06: qty 100

## 2018-08-06 MED ORDER — HYDROMORPHONE HCL 1 MG/ML IJ SOLN: 0.5000 mg | INTRAMUSCULAR | Status: DC | PRN

## 2018-08-06 MED ORDER — ALUM & MAG HYDROXIDE-SIMETH 200-200-20 MG/5ML PO SUSP: 30.0000 mL | ORAL | Status: DC | PRN

## 2018-08-06 MED ORDER — SPIRONOLACTONE-HCTZ 25-25 MG PO TABS: 1.0000 | ORAL_TABLET | Freq: Every day | ORAL | Status: DC

## 2018-08-06 MED ORDER — DIPHENHYDRAMINE HCL 12.5 MG/5ML PO ELIX: 12.5000 mg | ORAL_SOLUTION | ORAL | Status: DC | PRN

## 2018-08-06 MED ORDER — BUPIVACAINE HCL (PF) 0.25 % IJ SOLN
INTRAMUSCULAR | Status: AC
  Filled 2018-08-06: qty 60

## 2018-08-06 MED ORDER — ONDANSETRON HCL 4 MG PO TABS: 4.0000 mg | ORAL_TABLET | Freq: Four times a day (QID) | ORAL | Status: DC | PRN

## 2018-08-06 MED ORDER — MENTHOL 3 MG MT LOZG
1.0000 | LOZENGE | OROMUCOSAL | Status: DC | PRN
  Filled 2018-08-06: qty 9

## 2018-08-06 MED ORDER — CHLORHEXIDINE GLUCONATE 4 % EX LIQD: 60.0000 mL | Freq: Once | CUTANEOUS | Status: DC

## 2018-08-06 MED ORDER — BUPIVACAINE HCL (PF) 0.25 % IJ SOLN
INTRAMUSCULAR | Status: DC | PRN
  Administered 2018-08-06: 60 mL

## 2018-08-06 MED ORDER — DIPHENHYDRAMINE HCL 25 MG PO CAPS: 25.0000 mg | ORAL_CAPSULE | Freq: Every evening | ORAL | Status: DC | PRN

## 2018-08-06 MED ORDER — VITAMIN C 500 MG PO TABS
500.0000 mg | ORAL_TABLET | Freq: Every day | ORAL | Status: DC
  Administered 2018-08-07 – 2018-08-08 (×2): 500 mg via ORAL
  Filled 2018-08-06 (×2): qty 1

## 2018-08-06 MED ORDER — OXYCODONE HCL 5 MG PO TABS
5.0000 mg | ORAL_TABLET | ORAL | Status: DC | PRN
  Filled 2018-08-06: qty 1

## 2018-08-06 MED ORDER — BACITRACIN 50000 UNITS IM SOLR
INTRAMUSCULAR | Status: AC
  Filled 2018-08-06: qty 1

## 2018-08-06 MED ORDER — LIDOCAINE HCL (PF) 2 % IJ SOLN
INTRAMUSCULAR | Status: AC
  Filled 2018-08-06: qty 10

## 2018-08-06 MED ORDER — PRAVASTATIN SODIUM 20 MG PO TABS
80.0000 mg | ORAL_TABLET | Freq: Every day | ORAL | Status: DC
  Administered 2018-08-06 – 2018-08-08 (×3): 80 mg via ORAL
  Filled 2018-08-06 (×3): qty 4

## 2018-08-06 MED ORDER — INSULIN ASPART 100 UNIT/ML ~~LOC~~ SOLN
0.0000 [IU] | Freq: Three times a day (TID) | SUBCUTANEOUS | Status: DC
  Administered 2018-08-06 – 2018-08-07 (×2): 2 [IU] via SUBCUTANEOUS
  Filled 2018-08-06 (×2): qty 1

## 2018-08-06 MED ORDER — BISACODYL 10 MG RE SUPP: 10.0000 mg | Freq: Every day | RECTAL | Status: DC | PRN

## 2018-08-06 MED ORDER — CEFAZOLIN SODIUM-DEXTROSE 2-4 GM/100ML-% IV SOLN
2.0000 g | Freq: Four times a day (QID) | INTRAVENOUS | Status: AC
  Administered 2018-08-06 – 2018-08-07 (×4): 2 g via INTRAVENOUS
  Filled 2018-08-06 (×7): qty 100

## 2018-08-06 MED ORDER — PROPOFOL 500 MG/50ML IV EMUL
INTRAVENOUS | Status: AC
  Filled 2018-08-06: qty 50

## 2018-08-06 MED ORDER — ACETAMINOPHEN 10 MG/ML IV SOLN
1000.0000 mg | Freq: Four times a day (QID) | INTRAVENOUS | Status: AC
  Administered 2018-08-06 – 2018-08-07 (×4): 1000 mg via INTRAVENOUS
  Filled 2018-08-06 (×6): qty 100

## 2018-08-06 MED ORDER — QUINAPRIL HCL 10 MG PO TABS
40.0000 mg | ORAL_TABLET | Freq: Two times a day (BID) | ORAL | Status: DC
  Administered 2018-08-06 – 2018-08-08 (×4): 40 mg via ORAL
  Filled 2018-08-06 (×5): qty 4

## 2018-08-06 MED ORDER — TIMOLOL MALEATE 0.5 % OP SOLN
1.0000 [drp] | Freq: Two times a day (BID) | OPHTHALMIC | Status: DC
  Administered 2018-08-06 – 2018-08-08 (×4): 1 [drp] via OPHTHALMIC
  Filled 2018-08-06 (×2): qty 5

## 2018-08-06 MED ORDER — GENTAMICIN SULFATE 40 MG/ML IJ SOLN
INTRAMUSCULAR | Status: AC
  Filled 2018-08-06: qty 8

## 2018-08-06 MED ORDER — GABAPENTIN 300 MG PO CAPS
ORAL_CAPSULE | ORAL | Status: AC
  Administered 2018-08-06: 300 mg via ORAL
  Filled 2018-08-06: qty 1

## 2018-08-06 MED ORDER — ACETAMINOPHEN 10 MG/ML IV SOLN
INTRAVENOUS | Status: DC | PRN
  Administered 2018-08-06: 1000 mg via INTRAVENOUS

## 2018-08-06 MED ORDER — FENTANYL CITRATE (PF) 100 MCG/2ML IJ SOLN
INTRAMUSCULAR | Status: DC | PRN
  Administered 2018-08-06: 100 ug via INTRAVENOUS

## 2018-08-06 MED ORDER — MIDAZOLAM HCL 2 MG/2ML IJ SOLN
INTRAMUSCULAR | Status: AC
  Filled 2018-08-06: qty 2

## 2018-08-06 MED ORDER — LIDOCAINE HCL (CARDIAC) PF 100 MG/5ML IV SOSY
PREFILLED_SYRINGE | INTRAVENOUS | Status: DC | PRN
  Administered 2018-08-06: 100 mg via INTRAVENOUS

## 2018-08-06 SURGICAL SUPPLY — 69 items
ATTUNE MED DOME PAT 38 KNEE (Knees) ×2 IMPLANT
ATTUNE MED DOME PAT 38MM KNEE (Knees) ×1 IMPLANT
ATTUNE PSFEM LTSZ6 NARCEM KNEE (Femur) ×3 IMPLANT
ATTUNE PSRP INSR SZ6 5 KNEE (Insert) ×2 IMPLANT
ATTUNE PSRP INSR SZ6 5MM KNEE (Insert) ×1 IMPLANT
BASE TIBIAL ROT PLAT SZ 5 KNEE (Knees) ×1 IMPLANT
BATTERY INSTRU NAVIGATION (MISCELLANEOUS) ×12 IMPLANT
BLADE SAW 70X12.5 (BLADE) ×3 IMPLANT
BLADE SAW 90X13X1.19 OSCILLAT (BLADE) ×3 IMPLANT
BLADE SAW 90X25X1.19 OSCILLAT (BLADE) ×3 IMPLANT
BONE CEMENT GENTAMICIN (Cement) ×6 IMPLANT
CANISTER SUCT 1200ML W/VALVE (MISCELLANEOUS) ×3 IMPLANT
CANISTER SUCT 3000ML PPV (MISCELLANEOUS) ×6 IMPLANT
CEMENT BONE GENTAMICIN 40 (Cement) ×2 IMPLANT
COOLER POLAR GLACIER W/PUMP (MISCELLANEOUS) ×3 IMPLANT
COVER WAND RF STERILE (DRAPES) IMPLANT
CUFF TOURN 24 STER (MISCELLANEOUS) ×3 IMPLANT
DRAPE SHEET LG 3/4 BI-LAMINATE (DRAPES) ×3 IMPLANT
DRSG DERMACEA 8X12 NADH (GAUZE/BANDAGES/DRESSINGS) ×3 IMPLANT
DRSG OPSITE POSTOP 4X14 (GAUZE/BANDAGES/DRESSINGS) ×3 IMPLANT
DRSG TEGADERM 4X4.75 (GAUZE/BANDAGES/DRESSINGS) ×3 IMPLANT
DURAPREP 26ML APPLICATOR (WOUND CARE) ×6 IMPLANT
ELECT REM PT RETURN 9FT ADLT (ELECTROSURGICAL) ×3
ELECTRODE REM PT RTRN 9FT ADLT (ELECTROSURGICAL) ×1 IMPLANT
EX-PIN ORTHOLOCK NAV 4X150 (PIN) ×6 IMPLANT
GLOVE BIOGEL M STRL SZ7.5 (GLOVE) ×9 IMPLANT
GLOVE BIOGEL PI IND STRL 9 (GLOVE) ×3 IMPLANT
GLOVE BIOGEL PI INDICATOR 9 (GLOVE) ×6
GLOVE INDICATOR 8.0 STRL GRN (GLOVE) ×3 IMPLANT
GLOVE SURG SYN 9.0  PF PI (GLOVE) ×2
GLOVE SURG SYN 9.0 PF PI (GLOVE) ×1 IMPLANT
GOWN STRL REUS W/ TWL LRG LVL3 (GOWN DISPOSABLE) ×3 IMPLANT
GOWN STRL REUS W/TWL 2XL LVL3 (GOWN DISPOSABLE) ×3 IMPLANT
GOWN STRL REUS W/TWL LRG LVL3 (GOWN DISPOSABLE) ×6
HEMOVAC 400CC 10FR (MISCELLANEOUS) ×3 IMPLANT
HOLDER FOLEY CATH W/STRAP (MISCELLANEOUS) ×3 IMPLANT
HOOD PEEL AWAY FLYTE STAYCOOL (MISCELLANEOUS) ×6 IMPLANT
KIT TURNOVER KIT A (KITS) ×3 IMPLANT
KNIFE SCULPS 14X20 (INSTRUMENTS) ×3 IMPLANT
LABEL OR SOLS (LABEL) ×3 IMPLANT
NDL SAFETY ECLIPSE 18X1.5 (NEEDLE) ×1 IMPLANT
NEEDLE HYPO 18GX1.5 SHARP (NEEDLE) ×2
NEEDLE SPNL 20GX3.5 QUINCKE YW (NEEDLE) ×6 IMPLANT
NS IRRIG 500ML POUR BTL (IV SOLUTION) ×3 IMPLANT
PACK TOTAL KNEE (MISCELLANEOUS) ×3 IMPLANT
PAD WRAPON POLAR KNEE (MISCELLANEOUS) ×1 IMPLANT
PENCIL SMOKE ULTRAEVAC 22 CON (MISCELLANEOUS) ×3 IMPLANT
PIN FIXATION 1/8DIA X 3INL (PIN) ×9 IMPLANT
PULSAVAC PLUS IRRIG FAN TIP (DISPOSABLE) ×3
SOL .9 NS 3000ML IRR  AL (IV SOLUTION) ×2
SOL .9 NS 3000ML IRR UROMATIC (IV SOLUTION) ×1 IMPLANT
SOL PREP PVP 2OZ (MISCELLANEOUS) ×3
SOLUTION PREP PVP 2OZ (MISCELLANEOUS) ×1 IMPLANT
SPONGE DRAIN TRACH 4X4 STRL 2S (GAUZE/BANDAGES/DRESSINGS) ×3 IMPLANT
STAPLER SKIN PROX 35W (STAPLE) ×3 IMPLANT
STRAP TIBIA SHORT (MISCELLANEOUS) ×3 IMPLANT
SUCTION FRAZIER HANDLE 10FR (MISCELLANEOUS) ×2
SUCTION TUBE FRAZIER 10FR DISP (MISCELLANEOUS) ×1 IMPLANT
SUT VIC AB 0 CT1 36 (SUTURE) ×3 IMPLANT
SUT VIC AB 1 CT1 36 (SUTURE) ×6 IMPLANT
SUT VIC AB 2-0 CT2 27 (SUTURE) ×3 IMPLANT
SYR 20CC LL (SYRINGE) ×3 IMPLANT
SYR 30ML LL (SYRINGE) ×6 IMPLANT
TIBIAL BASE ROT PLAT SZ 5 KNEE (Knees) ×3 IMPLANT
TIP FAN IRRIG PULSAVAC PLUS (DISPOSABLE) ×1 IMPLANT
TOWEL OR 17X26 4PK STRL BLUE (TOWEL DISPOSABLE) ×3 IMPLANT
TOWER CARTRIDGE SMART MIX (DISPOSABLE) ×3 IMPLANT
TRAY FOLEY MTR SLVR 16FR STAT (SET/KITS/TRAYS/PACK) ×3 IMPLANT
WRAPON POLAR PAD KNEE (MISCELLANEOUS) ×3

## 2018-08-06 NOTE — Transfer of Care (Signed)
Immediate Anesthesia Transfer of Care Note  Patient: Lorraine Foley  Procedure(s) Performed: COMPUTER ASSISTED TOTAL KNEE ARTHROPLASTY (Left Knee)  Patient Location: PACU  Anesthesia Type:Spinal  Level of Consciousness: drowsy and patient cooperative  Airway & Oxygen Therapy: Patient Spontanous Breathing and Patient connected to nasal cannula oxygen  Post-op Assessment: Report given to RN and Post -op Vital signs reviewed and stable  Post vital signs: Reviewed and stable  Last Vitals:  Vitals Value Taken Time  BP 119/62 08/06/2018  3:45 PM  Temp 37 C 08/06/2018  3:45 PM  Pulse 64 08/06/2018  3:46 PM  Resp 0 08/06/2018  3:46 PM  SpO2 96 % 08/06/2018  3:46 PM  Vitals shown include unvalidated device data.  Last Pain:  Vitals:   08/06/18 0940  TempSrc: Oral  PainSc: 3          Complications: No apparent anesthesia complications

## 2018-08-06 NOTE — Progress Notes (Signed)
   08/06/18 1830  Clinical Encounter Type  Visited With Patient and family together  Visit Type Initial;Spiritual support  Referral From Nurse  Consult/Referral To Chaplain  Spiritual Encounters  Spiritual Needs Brochure;Emotional;Other (Comment)   [place] received an OR to educate the patient on the AD process and provide paperwork. I met with the patient and her nephew and his wife. Lorraine Foley seemed to be in a joyful mood and laughed as we shared jokes. I left the paperwork with the patient to be completed when she is ready.

## 2018-08-06 NOTE — Telephone Encounter (Signed)
Copied from CRM [id number]. Topic: General - Other >> Aug 06, 2018  4:10 PM Angela NevinWilliams, Candice N wrote: Reason for CRM: Patient wanted to leave a note for Dr. Lorin PicketScott stating Dr. Ernest PineHooten said everything went well with knee replacement. Just FYI.

## 2018-08-06 NOTE — NC FL2 (Signed)
[place] MEDICAID FL2 LEVEL OF CARE SCREENING TOOL     IDENTIFICATION  Patient Name: Lorraine Foley Birthdate: Dec 27, 1940 Sex: female Admission Date (Current Location): 08/06/2018  [place] and [place] Number:  Chiropodist and Address:  [facility], [address], [place], [place] [address]      Provider Number: [id number]  Attending Physician Name and Address:  Donato Heinz, MD  Relative Name and Phone Number:       Current Level of Care: Hospital Recommended Level of Care: Skilled Nursing Facility Prior Approval Number:    Date Approved/Denied:   PASRR Number: [id number])  Discharge Plan: SNF    Current Diagnoses: Patient Active Problem List   Diagnosis Date Noted  . 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 Vitamin D deficiency 04/04/2017  . Primary osteoarthritis of left knee 01/31/2016  . 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 Renal insufficiency 11/30/2014  . BMI 39.0-39.9,adult 08/03/2014  . Morbid obesity (HCC) 08/03/2014  . Bradycardia 04/13/2013  . Diabetes mellitus (HCC) 08/10/2012  . Hypertension 08/10/2012  . Hypercholesterolemia 08/10/2012  . Glaucoma 08/10/2012  . GERD (gastroesophageal reflux disease) 08/10/2012  . Hypothyroidism 08/10/2012  . Type 2 diabetes mellitus without complications (HCC) 08/10/2012    Orientation RESPIRATION BLADDER Height & Weight     Self, Situation, Place, Time  Normal Continent Weight: 204 lb 5.9 oz (92.7 kg) Height:  5' (152.4 cm)  BEHAVIORAL SYMPTOMS/MOOD NEUROLOGICAL BOWEL NUTRITION STATUS      Continent Diet(Diet: Clear Liquid to be advanced. )  AMBULATORY STATUS COMMUNICATION OF NEEDS Skin   Extensive Assist Verbally Surgical wounds(Incision: Left Knee. )                       Personal Care Assistance Level of Assistance  Bathing, Feeding, Dressing Bathing Assistance: Limited assistance Feeding assistance: Independent Dressing Assistance:  Limited assistance     Functional Limitations Info  Sight, Hearing, Speech Sight Info: Adequate Hearing Info: Adequate Speech Info: Adequate    SPECIAL CARE FACTORS FREQUENCY  PT (By licensed PT), OT (By licensed OT)     PT Frequency: (5) OT Frequency: (5)            Contractures      Additional Factors Info  Code Status, Allergies Code Status Info: (Full Code. ) Allergies Info: (Metformin And Related)           Current Medications (08/06/2018):  This is the current hospital active medication list Current Facility-Administered Medications  Medication Dose Route Frequency Provider Last Rate Last Dose  . 0.9 %  sodium chloride infusion   Intravenous Continuous Hooten, Illene Labrador, MD      . acetaminophen (OFIRMEV) IV 1,000 mg  1,000 mg Intravenous Q6H Hooten, Illene Labrador, MD      . Melene Muller ON 08/07/2018] acetaminophen (TYLENOL) tablet 325-650 mg  325-650 mg Oral Q6H PRN Hooten, Illene Labrador, MD      . diphenhydrAMINE (BENADRYL) capsule 25 mg  25 mg Oral QHS PRN Hooten, Illene Labrador, MD       And  . acetaminophen (TYLENOL) tablet 500 mg  500 mg Oral QHS PRN Hooten, Illene Labrador, MD      . alum & mag hydroxide-simeth (MAALOX/MYLANTA) 200-200-20 MG/5ML suspension 30 mL  30 mL Oral Q4H PRN Hooten, Illene Labrador, MD      . Melene Muller ON 08/07/2018] amLODipine (NORVASC) tablet 5 mg  5 mg Oral Daily Hooten, Illene Labrador, MD      .  bisacodyl (DULCOLAX) suppository 10 mg  10 mg Rectal Daily PRN Hooten, Illene Labrador, MD      . ceFAZolin (ANCEF) 2-4 GM/100ML-% IVPB           . ceFAZolin (ANCEF) IVPB 2g/100 mL premix  2 g Intravenous Q6H Hooten, Illene Labrador, MD      . celecoxib (CELEBREX) capsule 200 mg  200 mg Oral BID Hooten, Illene Labrador, MD      . cholecalciferol (VITAMIN D3) tablet 1,000 Units  1,000 Units Oral Daily Hooten, Illene Labrador, MD      . cloNIDine (CATAPRES) tablet 0.1 mg  0.1 mg Oral TID Hooten, Illene Labrador, MD      . diphenhydrAMINE (BENADRYL) 12.5 MG/5ML elixir 12.5-25 mg  12.5-25 mg Oral Q4H PRN Hooten, Illene Labrador, MD      .  Melene Muller ON 08/07/2018] enoxaparin (LOVENOX) injection 30 mg  30 mg Subcutaneous Q12H Hooten, Illene Labrador, MD      . ferrous sulfate tablet 325 mg  325 mg Oral BID WC Hooten, Illene Labrador, MD      . gabapentin (NEURONTIN) capsule 300 mg  300 mg Oral QHS Hooten, Illene Labrador, MD      . spironolactone (ALDACTONE) tablet 25 mg  25 mg Oral Daily Hooten, Illene Labrador, MD       And  . hydrochlorothiazide (HYDRODIURIL) tablet 25 mg  25 mg Oral Daily Hooten, Illene Labrador, MD      . HYDROmorphone (DILAUDID) injection 0.5-1 mg  0.5-1 mg Intravenous Q4H PRN Hooten, Illene Labrador, MD      . insulin aspart (novoLOG) injection 0-15 Units  0-15 Units Subcutaneous TID WC Hooten, Illene Labrador, MD      . latanoprost (XALATAN) 0.005 % ophthalmic solution 1 drop  1 drop Both Eyes QHS Hooten, Illene Labrador, MD      . Melene Muller ON 08/07/2018] levothyroxine (SYNTHROID, LEVOTHROID) tablet 100 mcg  100 mcg Oral Q0600 Hooten, Illene Labrador, MD      . magnesium hydroxide (MILK OF MAGNESIA) suspension 30 mL  30 mL Oral Daily Hooten, Illene Labrador, MD      . menthol-cetylpyridinium (CEPACOL) lozenge 3 mg  1 lozenge Oral PRN Hooten, Illene Labrador, MD       Or  . phenol (CHLORASEPTIC) mouth spray 1 spray  1 spray Mouth/Throat PRN Hooten, Illene Labrador, MD      . metoCLOPramide (REGLAN) tablet 5-10 mg  5-10 mg Oral Q8H PRN Hooten, Illene Labrador, MD       Or  . metoCLOPramide (REGLAN) injection 5-10 mg  5-10 mg Intravenous Q8H PRN Hooten, Illene Labrador, MD      . metoCLOPramide (REGLAN) tablet 10 mg  10 mg Oral TID AC & HS Hooten, Illene Labrador, MD      . ondansetron (ZOFRAN) tablet 4 mg  4 mg Oral Q6H PRN Hooten, Illene Labrador, MD       Or  . ondansetron (ZOFRAN) injection 4 mg  4 mg Intravenous Q6H PRN Hooten, Illene Labrador, MD      . oxyCODONE (Oxy IR/ROXICODONE) immediate release tablet 10 mg  10 mg Oral Q4H PRN Hooten, Illene Labrador, MD      . oxyCODONE (Oxy IR/ROXICODONE) immediate release tablet 5 mg  5 mg Oral Q4H PRN Hooten, Illene Labrador, MD      . pantoprazole (PROTONIX) EC tablet 40 mg  40 mg Oral BID Hooten, Illene Labrador, MD       . pravastatin (PRAVACHOL) tablet 80 mg  80 mg Oral  Daily Hooten, Illene LabradorJames P, MD      . quinapril (ACCUPRIL) tablet 40 mg  40 mg Oral BID Hooten, Illene LabradorJames P, MD      . senna-docusate (Senokot-S) tablet 1 tablet  1 tablet Oral BID Hooten, Illene LabradorJames P, MD      . sodium chloride (OCEAN) 0.65 % nasal spray 2 spray  2 spray Nasal Daily Hooten, Illene LabradorJames P, MD      . sodium phosphate (FLEET) 7-19 GM/118ML enema 1 enema  1 enema Rectal Once PRN Hooten, Illene LabradorJames P, MD      . timolol (TIMOPTIC) 0.5 % ophthalmic solution 1 drop  1 drop Both Eyes BID Hooten, Illene LabradorJames P, MD      . traMADol (ULTRAM) tablet 50-100 mg  50-100 mg Oral Q4H PRN Hooten, Illene LabradorJames P, MD      . vitamin C (ASCORBIC ACID) tablet 500 mg  500 mg Oral Daily Hooten, Illene LabradorJames P, MD         Discharge Medications: Please see discharge summary for a list of discharge medications.  Relevant Imaging Results:  Relevant Lab Results:   Additional Information (SSN: [id number])  Shilah Hefel, Darleen CrockerBailey M, LCSW

## 2018-08-06 NOTE — Anesthesia Post-op Follow-up Note (Signed)
Anesthesia QCDR form completed.        

## 2018-08-06 NOTE — Telephone Encounter (Signed)
FYI everything went ok with surgery

## 2018-08-06 NOTE — Progress Notes (Signed)
New Admit  Mental Orientation: A&OX4 Assessment: Pedal pulses 3+ bilaterally. Pt has full sensation and can wiggle toes bilaterally. Surgical site and hemovac intact. No adventitious heart or lung sounds. Skin: Intact, surgical incision Pain: 2/10, pt refuses pain medication at this time. Per pt oxycodone makes her extremely confused and we should refrain from using it. Will pass this on in report.  Safety Measures: Pt oriented to the unit and safety measures as well as call bell system. Admission: Complete Family: at bedside, updated on POC.

## 2018-08-06 NOTE — Op Note (Signed)
OPERATIVE NOTE  DATE OF SURGERY:  08/06/2018  PATIENT NAME:  Lorraine Foley   DOB: Mar 26, 1941  MRN: [id number]  PRE-OPERATIVE DIAGNOSIS: Degenerative arthrosis of the left knee, primary  POST-OPERATIVE DIAGNOSIS:  Same  PROCEDURE:  Left total knee arthroplasty using computer-assisted navigation  SURGEON:  Jena GaussJames P Hooten, Jr. M.D.  ASSISTANT:  Van ClinesJon Wolfe, PA (present and scrubbed throughout the case, critical for assistance with exposure, retraction, instrumentation, and closure)  ANESTHESIA: spinal  ESTIMATED BLOOD LOSS: 50 mL  FLUIDS REPLACED: 1500 mL of crystalloid  TOURNIQUET TIME: 108 minutes  DRAINS: 2 medium Hemovac drains  SOFT TISSUE RELEASES: Anterior cruciate ligament, posterior cruciate ligament, deep medial collateral ligament, patellofemoral ligament  IMPLANTS UTILIZED: DePuy Attune size 6N posterior stabilized femoral component (cemented), size 5 rotating platform tibial component (cemented), 38 mm medialized dome patella (cemented), and a 5 mm stabilized rotating platform polyethylene insert.  INDICATIONS FOR SURGERY: Lorraine Foley is a 77 y.o. year old female with a long history of progressive knee pain. X-rays demonstrated severe degenerative changes in tricompartmental fashion. The patient had not seen any significant improvement despite conservative nonsurgical intervention. After discussion of the risks and benefits of surgical intervention, the patient expressed understanding of the risks benefits and agree with plans for total knee arthroplasty.   The risks, benefits, and alternatives were discussed at length including but not limited to the risks of infection, bleeding, nerve injury, stiffness, blood clots, the need for revision surgery, cardiopulmonary complications, among others, and they were willing to proceed.  PROCEDURE IN DETAIL: The patient was brought into the operating room and, after adequate spinal anesthesia was achieved, a tourniquet was placed on  the patient's upper thigh. The patient's knee and leg were cleaned and prepped with alcohol and DuraPrep and draped in the usual sterile fashion. A "timeout" was performed as per usual protocol. The lower extremity was exsanguinated using an Esmarch, and the tourniquet was inflated to 300 mmHg. An anterior longitudinal incision was made followed by a standard mid vastus approach. The deep fibers of the medial collateral ligament were elevated in a subperiosteal fashion off of the medial flare of the tibia so as to maintain a continuous soft tissue sleeve. The patella was subluxed laterally and the patellofemoral ligament was incised. Inspection of the knee demonstrated severe degenerative changes with full-thickness loss of articular cartilage. Osteophytes were debrided using a rongeur. Anterior and posterior cruciate ligaments were excised. Two 4.0 mm Schanz pins were inserted in the femur and into the tibia for attachment of the array of trackers used for computer-assisted navigation. Hip center was identified using a circumduction technique. Distal landmarks were mapped using the computer. The distal femur and proximal tibia were mapped using the computer. The distal femoral cutting guide was positioned using computer-assisted navigation so as to achieve a 5 distal valgus cut. The femur was sized and it was felt that a size 6N femoral component was appropriate. A size 6 femoral cutting guide was positioned and the anterior cut was performed and verified using the computer. This was followed by completion of the posterior and chamfer cuts. Femoral cutting guide for the central box was then positioned in the center box cut was performed.  Attention was then directed to the proximal tibia. Medial and lateral menisci were excised. The extramedullary tibial cutting guide was positioned using computer-assisted navigation so as to achieve a 0 varus-valgus alignment and 3 posterior slope. The cut was performed and  verified using the computer. The proximal tibia  was sized and it was felt that a size 5 tibial tray was appropriate. Tibial and femoral trials were inserted followed by insertion of a 5 mm polyethylene insert. This allowed for excellent mediolateral soft tissue balancing both in flexion and in full extension. Finally, the patella was cut and prepared so as to accommodate a 38 mm medialized dome patella. A patella trial was placed and the knee was placed through a range of motion with excellent patellar tracking appreciated. The femoral trial was removed after debridement of posterior osteophytes. The central post-hole for the tibial component was reamed followed by insertion of a keel punch. Tibial trials were then removed. Cut surfaces of bone were irrigated with copious amounts of normal saline with antibiotic solution using pulsatile lavage and then suctioned dry. Polymethylmethacrylate cement with gentamicin was prepared in the usual fashion using a vacuum mixer. Cement was applied to the cut surface of the proximal tibia as well as along the undersurface of a size 5 rotating platform tibial component. Tibial component was positioned and impacted into place. Excess cement was removed using Personal assistant. Cement was then applied to the cut surfaces of the femur as well as along the posterior flanges of the size 6N femoral component. The femoral component was positioned and impacted into place. Excess cement was removed using Personal assistant. A 5 mm polyethylene trial was inserted and the knee was brought into full extension with steady axial compression applied. Finally, cement was applied to the backside of a 38 mm medialized dome patella and the patellar component was positioned and patellar clamp applied. Excess cement was removed using Personal assistant. After adequate curing of the cement, the tourniquet was deflated after a total tourniquet time of 108 minutes. Hemostasis was achieved using electrocautery.  The knee was irrigated with copious amounts of normal saline with antibiotic solution using pulsatile lavage and then suctioned dry. 20 mL of 1.3% Exparel and 60 mL of 0.25% Marcaine in 40 mL of normal saline was injected along the posterior capsule, medial and lateral gutters, and along the arthrotomy site. A 5 mm stabilized rotating platform polyethylene insert was inserted and the knee was placed through a range of motion with excellent mediolateral soft tissue balancing appreciated and excellent patellar tracking noted. 2 medium drains were placed in the wound bed and brought out through separate stab incisions. The medial parapatellar portion of the incision was reapproximated using interrupted sutures of #1 Vicryl. Subcutaneous tissue was approximated in layers using first #0 Vicryl followed #2-0 Vicryl. The skin was approximated with skin staples. A sterile dressing was applied.  The patient tolerated the procedure well and was transported to the recovery room in stable condition.    James P. Angie Fava., M.D.

## 2018-08-06 NOTE — Anesthesia Procedure Notes (Signed)
Spinal  Patient location during procedure: OR Start time: 08/06/2018 12:06 PM End time: 08/06/2018 12:12 PM Staffing Resident/CRNA: Marion, David, CRNA Performed: resident/CRNA  Preanesthetic Checklist Completed: patient identified, site marked, surgical consent, pre-op evaluation, timeout performed, IV checked, risks and benefits discussed and monitors and equipment checked Spinal Block Patient position: sitting Prep: ChloraPrep Patient monitoring: heart rate, continuous pulse ox, blood pressure and cardiac monitor Approach: midline Location: L4-5 Injection technique: single-shot Needle Needle type: Introducer and Pencan  Needle gauge: 24 G Needle length: 9 cm Additional Notes Negative paresthesia. Negative blood return. Positive free-flowing CSF. Expiration date of kit checked and confirmed. Patient tolerated procedure well, without complications.       

## 2018-08-06 NOTE — Anesthesia Preprocedure Evaluation (Addendum)
Anesthesia Evaluation  Patient identified by MRN, date of birth, ID band Patient awake    Reviewed: Allergy & Precautions, NPO status , Patient's Chart, lab work & pertinent test results  History of Anesthesia Complications Negative for: history of anesthetic complications  Airway Mallampati: III       Dental   Pulmonary neg sleep apnea, neg COPD,           Cardiovascular hypertension, Pt. on medications (-) Past MI and (-) CHF (-) dysrhythmias (-) Valvular Problems/Murmurs     Neuro/Psych neg Seizures    GI/Hepatic Neg liver ROS, GERD  ,  Endo/Other  diabetesHypothyroidism   Renal/GU Renal InsufficiencyRenal disease     Musculoskeletal   Abdominal   Peds  Hematology   Anesthesia Other Findings   Reproductive/Obstetrics                           Anesthesia Physical Anesthesia Plan  ASA: III  Anesthesia Plan:    Post-op Pain Management:    Induction:   PONV Risk Score and Plan:   Airway Management Planned: Nasal Cannula  Additional Equipment:   Intra-op Plan:   Post-operative Plan:   Informed Consent: I have reviewed the patients History and Physical, chart, labs and discussed the procedure including the risks, benefits and alternatives for the proposed anesthesia with the patient or authorized representative who has indicated his/her understanding and acceptance.     Plan Discussed with:   Anesthesia Plan Comments:         Anesthesia Quick Evaluation

## 2018-08-06 NOTE — H&P (Signed)
The patient has been re-examined, and the chart reviewed, and there have been no interval changes to the documented history and physical.    The risks, benefits, and alternatives have been discussed at length. The patient expressed understanding of the risks benefits and agreed with plans for surgical intervention.  James P. Hooten, Jr. M.D.    

## 2018-08-06 NOTE — Telephone Encounter (Signed)
Noted.  Let us know if she needs anything.

## 2018-08-07 ENCOUNTER — Encounter: Payer: Self-pay | Admitting: Orthopedic Surgery

## 2018-08-07 LAB — GLUCOSE, CAPILLARY
Glucose-Capillary: 116 mg/dL — ABNORMAL HIGH (ref 70–99)
Glucose-Capillary: 131 mg/dL — ABNORMAL HIGH (ref 70–99)
Glucose-Capillary: 105 mg/dL — ABNORMAL HIGH (ref 70–99)
Glucose-Capillary: 122 mg/dL — ABNORMAL HIGH (ref 70–99)

## 2018-08-07 NOTE — Progress Notes (Signed)
   Subjective: 1 Day Post-Op Procedure(s) (LRB): COMPUTER ASSISTED TOTAL KNEE ARTHROPLASTY (Left) Patient reports pain as mild.   Patient is well, and has had no acute complaints or problems We will start therapy today.  Plan is to go Home after hospital stay. no nausea and no vomiting Patient denies any chest pains or shortness of breath. Objective: Vital signs in last 24 hours: Temp:  [97.2 F (36.2 C)-98.6 F (37 C)] 97.4 F (36.3 C) (12/10 0352) Pulse Rate:  [46-69] 46 (12/10 0352) Resp:  [14-18] 16 (12/10 0352) BP: (119-193)/(45-83) 144/55 (12/10 0352) SpO2:  [94 %-98 %] 97 % (12/10 0352) Weight:  [92.7 kg] 92.7 kg (12/09 1700) Heels are non tender and elevated off the bed using rolled towels as well as bone foam under operative heel Intake/Output from previous day: 12/09 0701 - 12/10 0700 In: 2703.8 [P.O.:120; I.V.:2183.8; IV Piggyback:400] Out: 2425 [Urine:2125; Drains:275; Blood:25] Intake/Output this shift: No intake/output data recorded.  No results for input(s): HGB in the last 72 hours. No results for input(s): WBC, RBC, HCT, PLT in the last 72 hours. No results for input(s): NA, K, CL, CO2, BUN, CREATININE, GLUCOSE, CALCIUM in the last 72 hours. No results for input(s): LABPT, INR in the last 72 hours.  EXAM General - Patient is Alert, Appropriate and Oriented Extremity - Neurologically intact Neurovascular intact Sensation intact distally Intact pulses distally Dorsiflexion/Plantar flexion intact No cellulitis present Compartment soft Dressing - dressing C/D/I Motor Function - intact, moving foot and toes well on exam.  Able to do straight leg raise on her own  Past Medical History:  Diagnosis Date  . Arthritis    knees  . Diabetes mellitus (HCC)   . GERD (gastroesophageal reflux disease)   . Glaucoma   . Hypercholesterolemia   . Hypertension   . Hypothyroidism   . Pneumonia     Assessment/Plan: 1 Day Post-Op Procedure(s) (LRB): COMPUTER  ASSISTED TOTAL KNEE ARTHROPLASTY (Left) Active Problems:   Total knee replacement status  Estimated body mass index is 39.91 kg/m as calculated from the following:   Height as of this encounter: 5' (1.524 m).   Weight as of this encounter: 92.7 kg. Advance diet Up with therapy D/C IV fluids Plan for discharge tomorrow Discharge home with home health  Labs: None DVT Prophylaxis - Lovenox, Foot Pumps and TED hose Weight-Bearing as tolerated to left leg D/C O2 and Pulse OX and try on Room Air Begin working on bowel movement  Jon R. [facility] HospitalWolfe PA [facility] 08/07/2018, 7:34 AM

## 2018-08-07 NOTE — Care Management Note (Addendum)
Case Management Note  Patient Details  Name: Lorraine Foley MRN: [id number] Date of Birth: [birth year]  Subjective/Objective:                  RNCM met with patient regarding discharge planning. She plans to return to home with her nephew and his wife (Mr. & Mrs. Dr. Laveda Abbe). She admits that she will be alone during the day but is not concerned about that.   She has a front-wheeled walker available for use at home. She denies problems with transportation to appointments.  She uses [facility] for medications [phone]. Dr. Marry Guan would like RNCM to call in Lovenox '40mg'$  injections daily for 14 days no refills to pharmacy. Patient would like to use [facility] for home health.  Action/Plan: Home health agency list per CMS.gov provided to patient for review based on 3-5 star ratings. Patient also advised that [facility].  Referral to [facility]. Lovenox requested from [place]. RNCM will continue to follow.  Expected Discharge Date:  08/08/18               Expected Discharge Plan:     In-House Referral:     Discharge planning Services  CM Consult  Post Acute Care Choice:  Home Health Choice offered to:  Patient  DME Arranged:    DME Agency:     HH Arranged:  PT [place]:  [facility] (formerly [employer])  Status of Service:  In process, will continue to follow  If discussed at Long Length of Stay Meetings, dates discussed:    Additional Comments:Lovenox $5- patient agrees.  Marshell Garfinkel, RN 08/07/2018, 11:34 AM

## 2018-08-07 NOTE — Progress Notes (Signed)
Clinical Social Worker (CSW) received SNF consult. PT is recommending home health. RN case manager aware of above. Please reconsult if future social work needs arise. CSW signing off.   Colisha Redler, LCSW [phone] 

## 2018-08-07 NOTE — Evaluation (Signed)
Physical Therapy Evaluation Patient Details Name: Lorraine Foley MRN: [id number] DOB: 08-13-1941 Today's Date: 08/07/2018   History of Present Illness  Patient is a 77 yo female s/p left TKA. Patient has PMH to include: DM, GERD, Hypercholsterol, HTN, hypothroidism.  Clinical Impression  Patient ready to get up and use bathroom when I arrived in room. Required set up assist only for performing supine to sit. Ambulated 15 feet to toilet and then another 25 feet in  Room using rw, wbat on left LE.  Requires cga and cues for sequencing and safety with mobility, to keep rw close to her and to slow down at times. Patient performed seated exercises in the recliner with min assist and cues for proper technique.  Patient will benefit from BID treatments while in [facility] to improve functional independence, rom and strength for return home at discharge.        Follow Up Recommendations Home health PT    Equipment Recommendations       Recommendations for Other Services       Precautions / Restrictions Precautions Precautions: Fall Restrictions Weight Bearing Restrictions: Yes LLE Weight Bearing: Weight bearing as tolerated      Mobility  Bed Mobility Overal bed mobility: Modified Independent             General bed mobility comments: used rails for supine to sit  Transfers Overall transfer level: Needs assistance Equipment used: Rolling walker (2 wheeled) Transfers: Sit to/from Stand Sit to Stand: Min guard;Modified independent (Device/Increase time)         General transfer comment: requires cues and min assist for safe transfers, placement of AD and hand placement.   Ambulation/Gait Ambulation/Gait assistance: Min guard;Modified independent (Device/Increase time) Gait Distance (Feet): 40 Feet Assistive device: Rolling walker (2 wheeled) Gait Pattern/deviations: Step-to pattern;Decreased stance time - left Gait velocity: decreased   General Gait Details: slightly  impulsive, cues to slow down.   Stairs            Wheelchair Mobility    Modified Rankin (Stroke Patients Only)       Balance Overall balance assessment: Mild deficits observed, not formally tested                                           Pertinent Vitals/Pain Pain Assessment: No/denies pain    Home Living Family/patient expects to be discharged to:: Private residence Living Arrangements: Other relatives Available Help at Discharge: Family Type of Home: House Home Access: Ramped entrance     Home Layout: One level Home Equipment: Environmental consultant - 2 wheels      Prior Function Level of Independence: Independent with assistive device(s)               Hand Dominance        Extremity/Trunk Assessment   Upper Extremity Assessment Upper Extremity Assessment: Overall WFL for tasks assessed    Lower Extremity Assessment Lower Extremity Assessment: Overall WFL for tasks assessed    Cervical / Trunk Assessment Cervical / Trunk Assessment: Normal  Communication   Communication: No difficulties  Cognition Arousal/Alertness: Awake/alert Behavior During Therapy: WFL for tasks assessed/performed Overall Cognitive Status: Within Functional Limits for tasks assessed  General Comments      Exercises Total Joint Exercises Ankle Circles/Pumps: AROM;10 reps;Left Quad Sets: AROM;10 reps;Left Heel Slides: AAROM;10 reps;Left Hip ABduction/ADduction: AROM;10 reps;Left Straight Leg Raises: AROM;10 reps;Left Knee Flexion: AAROM Goniometric ROM: 70 degrees   Assessment/Plan    PT Assessment Patient needs continued PT services  PT Problem List Decreased range of motion;Decreased strength;Decreased mobility;Decreased safety awareness;Decreased activity tolerance;Decreased balance;Decreased knowledge of use of DME;Decreased knowledge of precautions       PT Treatment Interventions DME  instruction;Gait training;Functional mobility training;Balance training;Therapeutic exercise;Patient/family education;Therapeutic activities;Manual techniques    PT Goals (Current goals can be found in the Care Plan section)  Acute Rehab PT Goals Patient Stated Goal: to return home PT Goal Formulation: With patient Time For Goal Achievement: 08/21/18 Potential to Achieve Goals: Good    Frequency BID   Barriers to discharge        Co-evaluation               AM-PAC PT "6 Clicks" Mobility  Outcome Measure Help needed turning from your back to your side while in a flat bed without using bedrails?: A Little Help needed moving from lying on your back to sitting on the side of a flat bed without using bedrails?: A Little Help needed moving to and from a bed to a chair (including a wheelchair)?: A Little Help needed standing up from a chair using your arms (e.g., wheelchair or bedside chair)?: A Little Help needed to walk in hospital room?: A Little Help needed climbing 3-5 steps with a railing? : A Lot 6 Click Score: 17    End of Session Equipment Utilized During Treatment: Gait belt Activity Tolerance: Patient tolerated treatment well;Patient limited by fatigue Patient left: in chair;with chair alarm set;with family/visitor present;with call bell/phone within reach   PT Visit Diagnosis: Muscle weakness (generalized) (M62.81);Unsteadiness on feet (R26.81);Difficulty in walking, not elsewhere classified (R26.2)    Time: 0920-1000 PT Time Calculation (min) (ACUTE ONLY): 40 min   Charges:   PT Evaluation $PT Eval Moderate Complexity: 1 Mod PT Treatments $Gait Training: 8-22 mins $Therapeutic Exercise: 8-22 mins        Anae Hams, PT, GCS 08/07/18,10:11 AM

## 2018-08-07 NOTE — Evaluation (Signed)
Occupational Therapy Evaluation Patient Details Name: Lorraine Foley MRN: [id number] DOB: [birth year] Today's Date: 08/07/2018    History of Present Illness Patient is a 77 yo female s/p left TKA. Patient has PMH to include: DM, GERD, Hypercholsterol, HTN, hypothroidism.   Clinical Impression   Pt seen for OT evaluation this date, POD#1 from above surgery. Pt was independent in all ADLs prior to surgery, however occasionally using SPC for mobility at work due to L knee pain. Pt is eager to return to PLOF with less pain and improved safety and independence. Pt lives with nephew and his wife who are available PRN for assistance.  Pt currently requires minimal assist for LB dressing while in seated position due to pain and limited AROM of L knee. Pt instructed in polar care mgt, falls prevention strategies, home/routines modifications, DME/AE for LB bathing and dressing tasks, and compression stocking mgt. Pt verbalized understanding of all education provided at this date. Pt No further skilled OT sessions required during this hospitalization. Do not currently anticipate any OT needs following this hospitalization.      Follow Up Recommendations  No OT follow up    Equipment Recommendations  (sock aid)    Recommendations for Other Services       Precautions / Restrictions Precautions Precautions: Fall Restrictions Weight Bearing Restrictions: Yes LLE Weight Bearing: Weight bearing as tolerated      Mobility Bed Mobility              General bed mobility comments: deferred; pt presented in recliner  Transfers Overall transfer level: Needs assistance Equipment used: Rolling walker (2 wheeled) Transfers: Sit to/from Stand Sit to Stand: Min guard         General transfer comment: VC provided for correct hand placement on recliner and RW; VC provided to slow down    Balance Overall balance assessment: Needs assistance Sitting-balance support: Feet supported;No upper  extremity supported Sitting balance-Leahy Scale: Good     Standing balance support: Single extremity supported;During functional activity Standing balance-Leahy Scale: Good                             ADL either performed or assessed with clinical judgement   ADL Overall ADL's : Needs assistance/impaired                         Toilet Transfer: ChiropractorMin guard;RW;Regular Toilet;Ambulation           Functional mobility during ADLs: Min guard;Rolling walker General ADL Comments: Pt requires MIN A for LB dressing and bathing tasks in seated position.     Vision Baseline Vision/History: Wears glasses Wears Glasses: At all times Patient Visual Report: No change from baseline       Perception     Praxis      Pertinent Vitals/Pain Pain Assessment: No/denies pain     Hand Dominance     Extremity/Trunk Assessment Upper Extremity Assessment Upper Extremity Assessment: Overall WFL for tasks assessed(Good AROM bilat shoulders; grossly 5/5 strength)   Lower Extremity Assessment Lower Extremity Assessment: LLE deficits/detail LLE Deficits / Details: expected post op deficits   Cervical / Trunk Assessment Cervical / Trunk Assessment: Normal   Communication Communication Communication: No difficulties   Cognition Arousal/Alertness: Awake/alert Behavior During Therapy: WFL for tasks assessed/performed Overall Cognitive Status: Within Functional Limits for tasks assessed  General Comments  In recliner BP 111/48; standing 148/61    Exercises  Other Exercises Other Exercises: pt educated in compression sock and polar care mgt including donning/doffing, wear schedule and positioning Other Exercises: pt educated in use of AE for LB dressing Other Exercises: pt educated in fall prevention strategies when completing ADL tasks   Shoulder Instructions      Home Living Family/patient expects to be discharged  to:: Private residence Living Arrangements: Other relatives(nephew and his wife) Available Help at Discharge: Family;Available PRN/intermittently Type of Home: House Home Access: Ramped entrance     Home Layout: One level     Bathroom Shower/Tub: Producer, television/film/video: Handicapped height     Home Equipment: Toilet riser;Cane - quad;Walker - 2 wheels;Cane - single point;Hand held Primary school teacher)          Prior Functioning/Environment Level of Independence: Independent with assistive device(s)        Comments: Pt used SPC at work, not used for household distances; Pt independent with ADL and IADL tasks; works full time (sitting most of day); pt was driving; reports 0 falls in past 12 months; pt enjoys reading        OT Problem List: Decreased strength;Decreased range of motion;Impaired balance (sitting and/or standing);Decreased knowledge of use of DME or AE      OT Treatment/Interventions:      OT Goals(Current goals can be found in the care plan section) Acute Rehab OT Goals Patient Stated Goal: to return home OT Goal Formulation: All assessment and education complete, DC therapy  OT Frequency:     Barriers to D/C:            Co-evaluation              AM-PAC OT "6 Clicks" Daily Activity     Outcome Measure Help from another person eating meals?: None Help from another person taking care of personal grooming?: None Help from another person toileting, which includes using toliet, bedpan, or urinal?: A Little Help from another person bathing (including washing, rinsing, drying)?: A Little Help from another person to put on and taking off regular upper body clothing?: None Help from another person to put on and taking off regular lower body clothing?: A Little 6 Click Score: 21   End of Session Equipment Utilized During Treatment: Gait belt;Rolling walker  Activity Tolerance: Patient tolerated treatment well Patient left: in chair;with  chair alarm set;with call bell/phone within reach  OT Visit Diagnosis: Other abnormalities of gait and mobility (R26.89)                Time: 1441-1510 OT Time Calculation (min): 29 min Charges:     Lorraine Foley OTS 08/07/2018, 3:29 PM

## 2018-08-07 NOTE — Progress Notes (Signed)
Physical Therapy Treatment Patient Details Name: Lorraine Foley MRN: [id number] DOB: 1940/11/30 Today's Date: 08/07/2018    History of Present Illness Patient is a 77 yo female s/p left TKA. Patient has PMH to include: DM, GERD, Hypercholsterol, HTN, hypothroidism.    PT Comments    Patient reports she needs to use the bathroom is having diarreah. Patient ambulating with rolling walker and cga/supervision. Set up assist and cues needed for safety with ad and mobility. Patient able to ambulate 160 feet this pm without reports of pain or difficulty. Patient improving with LE strength and left knee rom 5- 80 degrees.   Patient will continue to benefit from skilled PT while here at [facility] to improve functional independence, rom and strength.       Follow Up Recommendations  Home health PT     Equipment Recommendations  Rolling walker with 5" wheels    Recommendations for Other Services       Precautions / Restrictions Precautions Precautions: Fall Restrictions Weight Bearing Restrictions: Yes LLE Weight Bearing: Weight bearing as tolerated    Mobility  Bed Mobility Overal bed mobility: (no assessed this pm)                Transfers Overall transfer level: Modified independent Equipment used: Rolling walker (2 wheeled) Transfers: Sit to/from Stand Sit to Stand: Min guard;Modified independent (Device/Increase time)         General transfer comment: cues needed for hand placement, safety  Ambulation/Gait Ambulation/Gait assistance: Min guard;Modified independent (Device/Increase time) Gait Distance (Feet): 160 Feet Assistive device: Rolling walker (2 wheeled) Gait Pattern/deviations: Step-to pattern;Decreased stance time - left Gait velocity: decreased   General Gait Details: cues needed to increase knee flexion on left with ambulation    Stairs             Wheelchair Mobility    Modified Rankin (Stroke Patients Only)       Balance Overall balance  assessment: Mild deficits observed, not formally tested                                          Cognition Arousal/Alertness: Awake/alert Behavior During Therapy: WFL for tasks assessed/performed Overall Cognitive Status: Within Functional Limits for tasks assessed                                        Exercises Total Joint Exercises Ankle Circles/Pumps: AROM;10 reps;Left;Seated Quad Sets: AROM;10 reps;Seated;Left Heel Slides: 10 reps;AAROM;Left;Seated Hip ABduction/ADduction: AROM;10 reps;Left;Seated Straight Leg Raises: AROM;10 reps;Left;Seated Long Arc Quad: AROM;10 reps;Left;Seated Goniometric ROM: 80 degrees    General Comments        Pertinent Vitals/Pain Pain Assessment: No/denies pain    Home Living                      Prior Function            PT Goals (current goals can now be found in the care plan section) Acute Rehab PT Goals Patient Stated Goal: to return home PT Goal Formulation: With patient Time For Goal Achievement: 08/21/18 Potential to Achieve Goals: Good Progress towards PT goals: Progressing toward goals    Frequency    BID      PT Plan Current plan remains appropriate    Co-evaluation  AM-PAC PT "6 Clicks" Mobility   Outcome Measure  Help needed turning from your back to your side while in a flat bed without using bedrails?: A Little Help needed moving from lying on your back to sitting on the side of a flat bed without using bedrails?: A Little Help needed moving to and from a bed to a chair (including a wheelchair)?: A Little Help needed standing up from a chair using your arms (e.g., wheelchair or bedside chair)?: A Little Help needed to walk in hospital room?: A Little Help needed climbing 3-5 steps with a railing? : A Lot 6 Click Score: 17    End of Session Equipment Utilized During Treatment: Gait belt Activity Tolerance: Patient tolerated treatment well;No  increased pain Patient left: in chair;with chair alarm set;with call bell/phone within reach Nurse Communication: Mobility status PT Visit Diagnosis: Unsteadiness on feet (R26.81);Muscle weakness (generalized) (M62.81);Difficulty in walking, not elsewhere classified (R26.2)     Time: 1350-1420 PT Time Calculation (min) (ACUTE ONLY): 30 min  Charges:  $Gait Training: 8-22 mins $Therapeutic Exercise: 8-22 mins                     Bernd Crom, PT, GCS 08/07/18,2:33 PM

## 2018-08-08 LAB — GLUCOSE, CAPILLARY: GLUCOSE-CAPILLARY: 100 mg/dL — AB (ref 70–99)

## 2018-08-08 MED ORDER — TRAMADOL HCL 50 MG PO TABS: 50.0000 mg | ORAL_TABLET | ORAL | 0 refills | Status: DC | PRN

## 2018-08-08 MED ORDER — OXYCODONE HCL 5 MG PO TABS: 5.0000 mg | ORAL_TABLET | Freq: Four times a day (QID) | ORAL | 0 refills | Status: DC | PRN

## 2018-08-08 MED ORDER — ENOXAPARIN SODIUM 40 MG/0.4ML ~~LOC~~ SOLN: 40.0000 mg | SUBCUTANEOUS | 0 refills | Status: DC

## 2018-08-08 NOTE — Discharge Summary (Signed)
Physician Discharge Summary  Patient ID: Lorraine Foley MRN: [id number] DOB/AGE: 04-Dec-1940 77 y.o.  Admit date: 08/06/2018 Discharge date: 08/08/2018  Admission Diagnoses:  PRIMARY OSTEOARTHRITIS OF LEFT KNEE   Discharge Diagnoses: Patient Active Problem List   Diagnosis Date Noted  . 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 Vitamin D deficiency 04/04/2017  . Primary osteoarthritis of left knee 01/31/2016  . 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 Renal insufficiency 11/30/2014  . BMI 39.0-39.9,adult 08/03/2014  . Morbid obesity (HCC) 08/03/2014  . Bradycardia 04/13/2013  . Diabetes mellitus (HCC) 08/10/2012  . Hypertension 08/10/2012  . Hypercholesterolemia 08/10/2012  . Glaucoma 08/10/2012  . GERD (gastroesophageal reflux disease) 08/10/2012  . Hypothyroidism 08/10/2012  . Type 2 diabetes mellitus without complications (HCC) 08/10/2012    Past Medical History:  Diagnosis Date  . Arthritis    knees  . Diabetes mellitus (HCC)   . GERD (gastroesophageal reflux disease)   . Glaucoma   . Hypercholesterolemia   . Hypertension   . Hypothyroidism   . Pneumonia      Transfusion: No transfusions during this admission   Consultants (if any):   Discharged Condition: Improved  Hospital Course: Lorraine Foley is an 77 y.o. female who was admitted 08/06/2018 with a diagnosis of degenerative arthrosis left knee and went to the operating room on 08/06/2018 and underwent the above named procedures.    Surgeries:Procedure(s): COMPUTER ASSISTED TOTAL KNEE ARTHROPLASTY on 08/06/2018  PRE-OPERATIVE DIAGNOSIS: Degenerative arthrosis of the left knee, primary  POST-OPERATIVE DIAGNOSIS:  Same  PROCEDURE:  Left total knee arthroplasty using computer-assisted navigation  SURGEON:  Jena Gauss. M.D.  ASSISTANT:  Van Clines, PA (present and scrubbed throughout the case, critical for assistance with exposure, retraction, instrumentation, and  closure)  ANESTHESIA: spinal  ESTIMATED BLOOD LOSS: 50 mL  FLUIDS REPLACED: 1500 mL of crystalloid  TOURNIQUET TIME: 108 minutes  DRAINS: 2 medium Hemovac drains  SOFT TISSUE RELEASES: Anterior cruciate ligament, posterior cruciate ligament, deep medial collateral ligament, patellofemoral ligament  IMPLANTS UTILIZED: DePuy Attune size 6N posterior stabilized femoral component (cemented), size 5 rotating platform tibial component (cemented), 38 mm medialized dome patella (cemented), and a 5 mm stabilized rotating platform polyethylene insert.  INDICATIONS FOR SURGERY: Lorraine Foley is a 77 y.o. year old female with a long history of progressive knee pain. X-rays demonstrated severe degenerative changes in tricompartmental fashion. The patient had not seen any significant improvement despite conservative nonsurgical intervention. After discussion of the risks and benefits of surgical intervention, the patient expressed understanding of the risks benefits and agree with plans for total knee arthroplasty.   The risks, benefits, and alternatives were discussed at length including but not limited to the risks of infection, bleeding, nerve injury, stiffness, blood clots, the need for revision surgery, cardiopulmonary complications, among others, and they were willing to proceed. Patient tolerated the surgery well. No complications .Patient was taken to PACU where she was stabilized and then transferred to the orthopedic floor.  Patient started on Lovenox 30 mg q 12 hrs. Foot pumps applied bilaterally at 80 mm hgb. Heels elevated off bed with rolled towels. No evidence of DVT. Calves non tender. Negative Homan. Physical therapy started on day #1 for gait training and transfer with OT starting on  day #1 for ADL and assisted devices. Patient has done well with therapy. Ambulated greater than 200 feet upon being discharged.  Was able to ascend and descend 4 steps safely and  independently  Patient's IV And Foley were discontinued on day #1 with Hemovac being discontinued on day #2. Dressing was changed on day 2 prior to patient being discharged   She was given perioperative antibiotics:  Anti-infectives (From admission, onward)   Start     Dose/Rate Route Frequency Ordered Stop   08/06/18 1830  ceFAZolin (ANCEF) IVPB 2g/100 mL premix     2 g 200 mL/hr over 30 Minutes Intravenous Every 6 hours 08/06/18 1647 08/07/18 1712   08/06/18 0914  ceFAZolin (ANCEF) 2-4 GM/100ML-% IVPB    Note to Pharmacy:  Agnes Lawrenceobinson, Patricia  : cabinet override      08/06/18 0914 08/06/18 2129   08/06/18 0600  ceFAZolin (ANCEF) IVPB 2g/100 mL premix  Status:  Discontinued     2 g 200 mL/hr over 30 Minutes Intravenous On call to O.R. 08/05/18 2333 08/06/18 [id number]    .  She was fitted with AV 1 compression foot pump devices, instructed on heel pumps, early ambulation, and fitted with TED stockings bilaterally for DVT prophylaxis.  She benefited maximally from the hospital stay and there were no complications.    Recent vital signs:  Vitals:   08/07/18 1645 08/08/18 0022  BP: (!) 137/40 (!) 118/49  Pulse: (!) 51 (!) 47  Resp: 17 17  Temp: 98.4 F (36.9 C) 98 F (36.7 C)  SpO2: 100% 98%    Recent laboratory studies:  Lab Results  Component Value Date   HGB 13.6 07/24/2018   HGB 13.9 12/19/2017   HGB 13.7 11/18/2016   Lab Results  Component Value Date   WBC 6.7 07/24/2018   PLT 261 07/24/2018   Lab Results  Component Value Date   INR 1.00 07/24/2018   Lab Results  Component Value Date   NA 139 07/24/2018   K 4.2 07/24/2018   CL 99 07/24/2018   CO2 29 07/24/2018   BUN 37 (H) 07/24/2018   CREATININE 1.16 (H) 07/24/2018   GLUCOSE 130 (H) 07/24/2018    Discharge Medications:   Allergies as of 08/08/2018      Reactions   Metformin And Related Other (See Comments)   Swelling of the lips      Medication List    STOP taking these medications   aspirin 81 MG  tablet     TAKE these medications   acetaminophen 650 MG CR tablet Commonly known as:  TYLENOL Take 650 mg by mouth daily as needed for pain.   amLODipine 5 MG tablet Commonly known as:  NORVASC TAKE 1 TABLET BY MOUTH TWO  TIMES DAILY What changed:  when to take this   celecoxib 200 MG capsule Commonly known as:  CELEBREX Take 200 mg by mouth daily.   cloNIDine 0.1 MG tablet Commonly known as:  CATAPRES TAKE 1 TABLET BY MOUTH 3  TIMES A DAY   diphenhydramine-acetaminophen 25-500 MG Tabs tablet Commonly known as:  TYLENOL PM Take 1 tablet by mouth at bedtime as needed.   enoxaparin 40 MG/0.4ML injection Commonly known as:  LOVENOX Inject 0.4 mLs (40 mg total) into the skin daily for 14 days.   glucosamine-chondroitin 500-400 MG tablet Take 1 tablet by mouth daily.   glucose blood test strip Use as instructed to check blood sugars once daily. Dx E11.9   HM SALONPAS PAIN RELIEF EX Apply 1 patch topically as needed.   latanoprost 0.005 % ophthalmic solution Commonly known as:  XALATAN Place 1 drop into both eyes at bedtime. As directed   levothyroxine 100  MCG tablet Commonly known as:  SYNTHROID, LEVOTHROID TAKE 1 TABLET BY MOUTH  DAILY What changed:  when to take this   onetouch ultrasoft lancets Use as instructed to check blood sugars once daily. Dx E11.9   OVER THE COUNTER MEDICATION Take 1 tablet by mouth daily. OSTEO-SHEATH   oxyCODONE 5 MG immediate release tablet Commonly known as:  Oxy IR/ROXICODONE Take 1-2 tablets (5-10 mg total) by mouth every 6 (six) hours as needed for moderate pain (pain score 4-6).   pravastatin 40 MG tablet Commonly known as:  PRAVACHOL TAKE 2 TABLETS BY MOUTH  DAILY What changed:  how much to take   quinapril 40 MG tablet Commonly known as:  ACCUPRIL TAKE 1 TABLET BY MOUTH TWO  TIMES DAILY   SIMPLY SALINE NA Place 2 sprays into the nose daily.   spironolactone-hydrochlorothiazide 25-25 MG tablet Commonly known as:   ALDACTAZIDE TAKE 1 TABLET BY MOUTH  DAILY   timolol 0.5 % ophthalmic solution Commonly known as:  TIMOPTIC Place 1 drop into both eyes 2 (two) times daily.   traMADol 50 MG tablet Commonly known as:  ULTRAM Take 1-2 tablets (50-100 mg total) by mouth every 4 (four) hours as needed for moderate pain.   trolamine salicylate 10 % cream Commonly known as:  ASPERCREME Apply 1 application topically as needed for muscle pain.   Turmeric 500 MG Caps Take 1 capsule by mouth daily.   vitamin C 500 MG tablet Commonly known as:  ASCORBIC ACID Take 500 mg by mouth daily.   Vitamin D-3 25 MCG (1000 UT) Caps Take 1 capsule by mouth daily.            Durable Medical Equipment  (From admission, onward)         Start     Ordered   08/06/18 1648  DME Walker rolling  Once    Question:  Patient needs a walker to treat with the following condition  Answer:  Total knee replacement status   08/06/18 1647   08/06/18 1648  DME Bedside commode  Once    Question:  Patient needs a bedside commode to treat with the following condition  Answer:  Total knee replacement status   08/06/18 1647          Diagnostic Studies: Dg Knee Left Port  Result Date: 08/06/2018 CLINICAL DATA:  Left total knee replacement EXAM: PORTABLE LEFT KNEE - 1-2 VIEW COMPARISON:  None. FINDINGS: Knee prosthesis is noted in place. Surgical drains are also seen. No acute bony or soft tissue abnormality is noted. IMPRESSION: Status post left knee replacement. Electronically Signed   By: Alcide Clever M.D.   On: 08/06/2018 16:31    Disposition: Discharge disposition: 01-Home or Self Care       Discharge Instructions    Increase activity slowly   Complete by:  As directed       Follow-up Information    Tera Partridge, PA On 08/21/2018.   Specialty:  Physician Assistant Why:  at 9:15am Contact information: [address] [place] [address] [phone]        Donato Heinz, MD On 09/18/2018.    Specialty:  Orthopedic Surgery Why:  at 9:45am Contact information: [address] [facility] [place] [address] [phone]            Signed: Tera Partridge 08/08/2018, 7:18 AM

## 2018-08-08 NOTE — Care Management (Signed)
Teresa with [facility] notified of patient discharge. No other RNCM needs. 

## 2018-08-08 NOTE — Progress Notes (Signed)
   Subjective: 2 Days Post-Op Procedure(s) (LRB): COMPUTER ASSISTED TOTAL KNEE ARTHROPLASTY (Left) Patient reports pain as mild.   Patient is well, and has had no acute complaints or problems Patient did very well with physical therapy yesterday.  Was able to ambulate around the nurses desk.  Range of motion 0 to 70 degrees. Plan is to go Home after hospital stay. no nausea and no vomiting Patient denies any chest pains or shortness of breath. Objective: Vital signs in last 24 hours: Temp:  [97.6 F (36.4 C)-98.4 F (36.9 C)] 98 F (36.7 C) (12/11 0022) Pulse Rate:  [47-55] 47 (12/11 0022) Resp:  [17-18] 17 (12/11 0022) BP: (118-149)/(40-56) 118/49 (12/11 0022) SpO2:  [98 %-100 %] 98 % (12/11 0022) well approximated incision Heels are non tender and elevated off the bed using rolled towels Intake/Output from previous day: 12/10 0701 - 12/11 0700 In: 120 [P.O.:120] Out: 250 [Drains:250] Intake/Output this shift: No intake/output data recorded.  No results for input(s): HGB in the last 72 hours. No results for input(s): WBC, RBC, HCT, PLT in the last 72 hours. No results for input(s): NA, K, CL, CO2, BUN, CREATININE, GLUCOSE, CALCIUM in the last 72 hours. No results for input(s): LABPT, INR in the last 72 hours.  EXAM General - Patient is Alert, Appropriate and Oriented Extremity - Neurologically intact Neurovascular intact Sensation intact distally Intact pulses distally Dorsiflexion/Plantar flexion intact No cellulitis present Compartment soft Dressing - dressing C/D/I Motor Function - intact, moving foot and toes well on exam.    Past Medical History:  Diagnosis Date  . Arthritis    knees  . Diabetes mellitus (HCC)   . GERD (gastroesophageal reflux disease)   . Glaucoma   . Hypercholesterolemia   . Hypertension   . Hypothyroidism   . Pneumonia     Assessment/Plan: 2 Days Post-Op Procedure(s) (LRB): COMPUTER ASSISTED TOTAL KNEE ARTHROPLASTY (Left) Active  Problems:   Total knee replacement status  Estimated body mass index is 39.91 kg/m as calculated from the following:   Height as of this encounter: 5' (1.524 m).   Weight as of this encounter: 92.7 kg. Up with therapy Discharge home with home health  Labs: None DVT Prophylaxis - Lovenox, Foot Pumps and TED hose Weight-Bearing as tolerated to left leg Hemovac was discontinued today.  Into the drain appeared to be intact. Please wash operative leg, change dressing apply TED stockings to both legs Please give the patient 2 extra honeycomb dressings to take home Be sure bone foam goes home with patient  Lynnda ShieldsJon R. [facility] Services LLCWolfe PA [facility] 08/08/2018, 7:13 AM

## 2018-08-08 NOTE — Progress Notes (Signed)
Physical Therapy Treatment Patient Details Name: Lorraine Foley MRN: [id number] DOB: 1940-09-22 Today's Date: 08/08/2018    History of Present Illness Patient is a 77 yo female s/p left TKA. Patient has PMH to include: DM, GERD, Hypercholsterol, HTN, hypothroidism.    PT Comments    Patient received in recliner this morning. IV and drain have been removed. Patient reports minimal pain and that she slept well last night. Patient performed seated exercises, ambulated 180 feet with supervision and rolling walker, step through gait pattern with cues to increase knee flexion. Ambulated up/down 4 steps with bilateral rails with supervision and cues for sequencing. Good tolerance with all activities.         Follow Up Recommendations  Home health PT     Equipment Recommendations  Rolling walker with 5" wheels    Recommendations for Other Services       Precautions / Restrictions Precautions Precautions: Fall Restrictions Weight Bearing Restrictions: Yes LLE Weight Bearing: Weight bearing as tolerated    Mobility  Bed Mobility               General bed mobility comments: patient in recliner upon arrival- not assessed  Transfers Overall transfer level: Modified independent Equipment used: Rolling walker (2 wheeled) Transfers: Sit to/from Stand Sit to Stand: Modified independent (Device/Increase time);Supervision            Ambulation/Gait Ambulation/Gait assistance: Modified independent (Device/Increase time);Supervision Gait Distance (Feet): 180 Feet Assistive device: Rolling walker (2 wheeled) Gait Pattern/deviations: Step-through pattern     General Gait Details: cues to increase left knee flexion with ambulation   Stairs             Wheelchair Mobility    Modified Rankin (Stroke Patients Only)       Balance Overall balance assessment: No apparent balance deficits (not formally assessed) Sitting-balance support: Feet supported Sitting  balance-Leahy Scale: Good     Standing balance support: Single extremity supported Standing balance-Leahy Scale: Good                              Cognition Arousal/Alertness: Awake/alert Behavior During Therapy: WFL for tasks assessed/performed Overall Cognitive Status: Within Functional Limits for tasks assessed                                        Exercises Total Joint Exercises Ankle Circles/Pumps: AROM;Left;Seated;10 reps Quad Sets: AROM;10 reps;Seated;Left Heel Slides: AAROM;10 reps;Left;Seated Hip ABduction/ADduction: AROM;10 reps;Left;Seated Straight Leg Raises: AROM;10 reps;Left;Seated Long Arc Quad: 10 reps;AROM;Left;Seated Goniometric ROM: 2-75 degrees    General Comments        Pertinent Vitals/Pain Pain Assessment: 0-10 Pain Score: 2  Pain Descriptors / Indicators: Aching;Discomfort Pain Intervention(s): Monitored during session;Ice applied;Utilized relaxation techniques    Home Living                      Prior Function            PT Goals (current goals can now be found in the care plan section) Acute Rehab PT Goals Patient Stated Goal: to return home PT Goal Formulation: With patient Time For Goal Achievement: 08/21/18 Potential to Achieve Goals: Good Progress towards PT goals: Progressing toward goals    Frequency           PT Plan Current plan remains appropriate  Co-evaluation              AM-PAC PT "6 Clicks" Mobility   Outcome Measure  Help needed turning from your back to your side while in a flat bed without using bedrails?: A Little Help needed moving from lying on your back to sitting on the side of a flat bed without using bedrails?: A Little Help needed moving to and from a bed to a chair (including a wheelchair)?: A Little Help needed standing up from a chair using your arms (e.g., wheelchair or bedside chair)?: A Little Help needed to walk in hospital room?: A Little Help  needed climbing 3-5 steps with a railing? : A Little 6 Click Score: 18    End of Session Equipment Utilized During Treatment: Gait belt Activity Tolerance: Patient tolerated treatment well Patient left: in chair;with call bell/phone within reach Nurse Communication: Mobility status PT Visit Diagnosis: Other abnormalities of gait and mobility (R26.89);Muscle weakness (generalized) (M62.81)     Time: [id number] PT Time Calculation (min) (ACUTE ONLY): 15 min  Charges:  $Gait Training: 8-22 mins                     [employer] InternationalKristyn Odeal Welden, PT, GCS 08/08/18,9:22 AM

## 2018-08-16 NOTE — Anesthesia Postprocedure Evaluation (Signed)
Anesthesia Post Note  Patient: Arbie CookeyJanet L Swiss  Procedure(s) Performed: COMPUTER ASSISTED TOTAL KNEE ARTHROPLASTY (Left Knee)  Comments: Patient discharged prior to anesthesia evaluation     Last Vitals:  Vitals:   08/08/18 0022 08/08/18 0740  BP: (!) 118/49 (!) 146/84  Pulse: (!) 47 (!) 53  Resp: 17 18  Temp: 36.7 C 36.4 C  SpO2: 98% 98%    Last Pain:  Vitals:   08/08/18 0740  TempSrc:   PainSc: 2                  Starling Mannsurtis,  Kebin Maye A

## 2018-09-12 ENCOUNTER — Other Ambulatory Visit: Payer: Self-pay | Admitting: Internal Medicine

## 2018-09-12 DIAGNOSIS — Z1231 Encounter for screening mammogram for malignant neoplasm of breast: Secondary | ICD-10-CM

## 2018-09-28 ENCOUNTER — Ambulatory Visit
Admission: RE | Admit: 2018-09-28 | Discharge: 2018-09-28 | Disposition: A | Discharge status: Home / Self Care | Payer: 59 | Source: Ambulatory Visit | Attending: Internal Medicine | Admitting: Internal Medicine

## 2018-09-28 ENCOUNTER — Encounter: Payer: Self-pay | Admitting: Internal Medicine

## 2018-09-28 DIAGNOSIS — Z0000 Encounter for general adult medical examination without abnormal findings: Secondary | ICD-10-CM | POA: Insufficient documentation

## 2018-09-28 DIAGNOSIS — Z1231 Encounter for screening mammogram for malignant neoplasm of breast: Secondary | ICD-10-CM | POA: Insufficient documentation

## 2018-11-01 ENCOUNTER — Ambulatory Visit: Payer: 59 | Admitting: Internal Medicine

## 2018-11-01 ENCOUNTER — Encounter: Payer: Self-pay | Admitting: Internal Medicine

## 2018-11-01 VITALS — BP 140/78 | HR 60 | Temp 98.3°F | Wt 204.6 lb

## 2018-11-01 DIAGNOSIS — E119 Type 2 diabetes mellitus without complications: Secondary | ICD-10-CM | POA: Diagnosis not present

## 2018-11-01 DIAGNOSIS — E7800 Pure hypercholesterolemia, unspecified: Secondary | ICD-10-CM

## 2018-11-01 DIAGNOSIS — E2839 Other primary ovarian failure: Secondary | ICD-10-CM

## 2018-11-01 DIAGNOSIS — E559 Vitamin D deficiency, unspecified: Secondary | ICD-10-CM

## 2018-11-01 DIAGNOSIS — N289 Disorder of kidney and ureter, unspecified: Secondary | ICD-10-CM

## 2018-11-01 DIAGNOSIS — E039 Hypothyroidism, unspecified: Secondary | ICD-10-CM

## 2018-11-01 DIAGNOSIS — I10 Essential (primary) hypertension: Secondary | ICD-10-CM | POA: Diagnosis not present

## 2018-11-01 NOTE — Progress Notes (Signed)
Patient ID: Lorraine Foley, female   DOB: June 02, 1941, 78 y.o.   MRN: [id number]   Subjective:    Patient ID: Lorraine Foley, female    DOB: 1941-01-24, 78 y.o.   MRN: [id number]  HPI  Patient here for a scheduled follow up. S/p left total knee arthroplasty.  Seeing Dr Marry Guan.  Went to therapy.  Doing well.  Feels good.  No chest pain.  No sob.  No acid reflux.  No abdominal pain.  Bowels moving.  No urine change.   Has been exercising.  Blood pressures have been doing well - 295J systolic.  Sugars doing better.  Recent a1c 6.3.  Discussed need for bone density. Needs second shingrx.     Past Medical History:  Diagnosis Date  . Arthritis    knees  . Diabetes mellitus ([place])   . GERD (gastroesophageal reflux disease)   . Glaucoma   . Hypercholesterolemia   . Hypertension   . Hypothyroidism   . Pneumonia    Past Surgical History:  Procedure Laterality Date  . CATARACT EXTRACTION W/PHACO Left 11/09/2016   Procedure: CATARACT EXTRACTION PHACO AND INTRAOCULAR LENS PLACEMENT ([place])  left diabetic complicated;  Surgeon: Leandrew Koyanagi, MD;  Location: [facility];  Service: Ophthalmology;  Laterality: Left;  Diabetic - oral meds  . CATARACT EXTRACTION W/PHACO Right 03/08/2017   Procedure: CATARACT EXTRACTION PHACO AND INTRAOCULAR LENS PLACEMENT ([place])  Right Diabetic complicated;  Surgeon: Leandrew Koyanagi, MD;  Location: [facility];  Service: Ophthalmology;  Laterality: Right;  . EYE SURGERY Bilateral 08/2014, 2.2016   laser surgery in preparation for glaucoma surgery  . KNEE ARTHROPLASTY Left 08/06/2018   Procedure: COMPUTER ASSISTED TOTAL KNEE ARTHROPLASTY;  Surgeon: Dereck Leep, MD;  Location: [facility];  Service: Orthopedics;  Laterality: Left;  . TOTAL HIP ARTHROPLASTY  5/13   right   Family History  Problem Relation Age of Onset  . Mental illness Father        suicide  . Liver disease Father        alcohol  . Glaucoma Father   . Alcohol abuse Father   .  Blindness Father   . Glaucoma Mother   . Osteoporosis Mother   . Diabetes Sister   . Breast cancer Sister 57  . Colon cancer Neg Hx    Social History   Socioeconomic History  . Marital status: Divorced    Spouse name: Not on file  . Number of children: 1  . Years of education: Not on file  . Highest education level: Not on file  Occupational History  . Not on file  Social Needs  . Financial resource strain: Not on file  . Food insecurity:    Worry: Not on file    Inability: Not on file  . Transportation needs:    Medical: Not on file    Non-medical: Not on file  Tobacco Use  . Smoking status: Never Smoker  . Smokeless tobacco: Never Used  Substance and Sexual Activity  . Alcohol use: No    Alcohol/week: 0.0 standard drinks  . Drug use: No  . Sexual activity: Not on file  Lifestyle  . Physical activity:    Days per week: Not on file    Minutes per session: Not on file  . Stress: Not on file  Relationships  . Social connections:    Talks on phone: Not on file    Gets together: Not on file    Attends religious service:  Not on file    Active member of club or organization: Not on file    Attends meetings of clubs or organizations: Not on file    Relationship status: Not on file  Other Topics Concern  . Not on file  Social History Narrative  . Not on file    Outpatient Encounter Medications as of 11/01/2018  Medication Sig  . acetaminophen (TYLENOL) 650 MG CR tablet Take 650 mg by mouth daily as needed for pain.  Marland Kitchen amLODipine (NORVASC) 5 MG tablet TAKE 1 TABLET BY MOUTH TWO  TIMES DAILY  . Camphor-Menthol-Methyl Sal (HM SALONPAS PAIN RELIEF EX) Apply 1 patch topically as needed.  . celecoxib (CELEBREX) 200 MG capsule Take 200 mg by mouth daily.  . Cholecalciferol (VITAMIN D-3) 1000 UNITS CAPS Take 1 capsule by mouth daily.  . cloNIDine (CATAPRES) 0.1 MG tablet TAKE 1 TABLET BY MOUTH 3  TIMES A DAY  . diphenhydramine-acetaminophen (TYLENOL PM) 25-500 MG TABS tablet  Take 1 tablet by mouth at bedtime as needed.  Marland Kitchen glucosamine-chondroitin 500-400 MG tablet Take 1 tablet by mouth daily.  Marland Kitchen glucose blood test strip Use as instructed to check blood sugars once daily. Dx E11.9  . Lancets (ONETOUCH ULTRASOFT) lancets Use as instructed to check blood sugars once daily. Dx E11.9  . latanoprost (XALATAN) 0.005 % ophthalmic solution Place 1 drop into both eyes at bedtime. As directed   . levothyroxine (SYNTHROID, LEVOTHROID) 100 MCG tablet TAKE 1 TABLET BY MOUTH  DAILY (Patient taking differently: Take 100 mcg by mouth daily before breakfast. )  . OVER THE COUNTER MEDICATION Take 1 tablet by mouth daily. OSTEO-SHEATH  . oxyCODONE (OXY IR/ROXICODONE) 5 MG immediate release tablet Take 1-2 tablets (5-10 mg total) by mouth every 6 (six) hours as needed for moderate pain (pain score 4-6).  . pravastatin (PRAVACHOL) 40 MG tablet TAKE 2 TABLETS BY MOUTH  DAILY  . quinapril (ACCUPRIL) 40 MG tablet TAKE 1 TABLET BY MOUTH TWO  TIMES DAILY  . SIMPLY SALINE NA Place 2 sprays into the nose daily.  Marland Kitchen spironolactone-hydrochlorothiazide (ALDACTAZIDE) 25-25 MG tablet TAKE 1 TABLET BY MOUTH  DAILY  . timolol (TIMOPTIC) 0.5 % ophthalmic solution Place 1 drop into both eyes 2 (two) times daily.   . traMADol (ULTRAM) 50 MG tablet Take 1-2 tablets (50-100 mg total) by mouth every 4 (four) hours as needed for moderate pain.  Marland Kitchen trolamine salicylate (ASPERCREME) 10 % cream Apply 1 application topically as needed for muscle pain.  . Turmeric 500 MG CAPS Take 1 capsule by mouth daily.  . vitamin C (ASCORBIC ACID) 500 MG tablet Take 500 mg by mouth daily.  Marland Kitchen enoxaparin (LOVENOX) 40 MG/0.4ML injection Inject 0.4 mLs (40 mg total) into the skin daily for 14 days.   No facility-administered encounter medications on file as of 11/01/2018.     Review of Systems  Constitutional: Negative for appetite change and unexpected weight change.  HENT: Negative for congestion and sinus pressure.     Respiratory: Negative for cough, chest tightness and shortness of breath.   Cardiovascular: Negative for chest pain, palpitations and leg swelling.  Gastrointestinal: Negative for abdominal pain, diarrhea, nausea and vomiting.  Genitourinary: Negative for difficulty urinating and dysuria.  Musculoskeletal: Negative for joint swelling and myalgias.  Skin: Negative for color change and rash.  Neurological: Negative for dizziness, light-headedness and headaches.  Psychiatric/Behavioral: Negative for agitation and dysphoric mood.      Objective:    Physical Exam Constitutional:  General: She is not in acute distress.    Appearance: Normal appearance.  HENT:     Nose: Nose normal. No congestion.     Mouth/Throat:     Pharynx: No oropharyngeal exudate or posterior oropharyngeal erythema.  Neck:     Musculoskeletal: Neck supple. No muscular tenderness.     Thyroid: No thyromegaly.  Cardiovascular:     Rate and Rhythm: Normal rate and regular rhythm.     Comments: 2/6 systolic murmur.  Pulmonary:     Effort: No respiratory distress.     Breath sounds: Normal breath sounds. No wheezing.  Abdominal:     General: Bowel sounds are normal.     Palpations: Abdomen is soft.     Tenderness: There is no abdominal tenderness.  Musculoskeletal:        General: No swelling or tenderness.  Lymphadenopathy:     Cervical: No cervical adenopathy.  Skin:    Findings: No erythema or rash.  Neurological:     Mental Status: She is alert.  Psychiatric:        Mood and Affect: Mood normal.        Behavior: Behavior normal.    BP 140/78   Pulse 60   Temp 98.3 F (36.8 C) (Oral)   Wt 204 lb 9.6 oz (92.8 kg)   LMP 08/11/1995   SpO2 98%   BMI 39.96 kg/m  Wt Readings from Last 3 Encounters:  11/01/18 204 lb 9.6 oz (92.8 kg)  08/06/18 204 lb 5.9 oz (92.7 kg)  07/24/18 201 lb 14.4 oz (91.6 kg)     Lab Results  Component Value Date   WBC 6.7 07/24/2018   HGB 13.6 07/24/2018   HCT  40.0 07/24/2018   PLT 261 07/24/2018   GLUCOSE 130 (H) 07/24/2018   CHOL 199 04/20/2018   TRIG 165 (H) 04/20/2018   HDL 45 04/20/2018   LDLCALC 121 (H) 04/20/2018   ALT 15 07/24/2018   AST 17 07/24/2018   NA 139 07/24/2018   K 4.2 07/24/2018   CL 99 07/24/2018   CREATININE 1.16 (H) 07/24/2018   BUN 37 (H) 07/24/2018   CO2 29 07/24/2018   TSH 1.230 04/20/2018   INR 1.00 07/24/2018   HGBA1C 6.3 (H) 07/24/2018    Mm 3d Screen Breast Bilateral  Result Date: 09/28/2018 CLINICAL DATA:  Screening. EXAM: DIGITAL SCREENING BILATERAL MAMMOGRAM WITH TOMO AND CAD COMPARISON:  Previous exam(s). ACR Breast Density Category b: There are scattered areas of fibroglandular density. FINDINGS: There are no findings suspicious for malignancy. Images were processed with CAD. IMPRESSION: No mammographic evidence of malignancy. A result letter of this screening mammogram will be mailed directly to the patient. RECOMMENDATION: Screening mammogram in one year. (Code:SM-B-01Y) BI-RADS CATEGORY  1: Negative. Electronically Signed   By: Dorise Bullion III M.D   On: 09/28/2018 15:59       Assessment & Plan:   Problem List Items Addressed This Visit    Hypercholesterolemia    On pravastatin.  Low cholesterol diet and exercise.  Follow lipid panel and liver function tests.        Relevant Orders   Hepatic function panel   Lipid panel   Hypertension    Outside blood pressure checks wnl.  Continue same medication.  Follow pressures.  Follow metabolic panel.        Relevant Orders   CBC with Differential/Platelet   Hypothyroidism    On thyroid replacement.  Follow tsh.  Renal insufficiency    Stay hydrated. Avoid antiinflammatories.  Follow metabolic panel.       Type 2 diabetes mellitus without complications (HCC)    Recent a1c 6.3.  Trying to watch her diet.  Has been exercising.  Follow met b and a1c.       Relevant Orders   Hemoglobin X5L   Basic metabolic panel   Microalbumin /  creatinine urine ratio   Vitamin D deficiency    Follow vitamin D level.        Relevant Orders   VITAMIN D 25 Hydroxy (Vit-D Deficiency, Fractures)    Other Visit Diagnoses    Estrogen deficiency    -  Primary   Relevant Orders   DG Bone Density       Einar Pheasant, MD

## 2018-11-03 ENCOUNTER — Encounter: Payer: Self-pay | Admitting: Internal Medicine

## 2018-11-03 NOTE — Assessment & Plan Note (Signed)
Stay hydrated.  Avoid antiinflammatories.  Follow metabolic panel.   

## 2018-11-03 NOTE — Assessment & Plan Note (Signed)
On thyroid replacement.  Follow tsh.  

## 2018-11-03 NOTE — Assessment & Plan Note (Signed)
Follow vitamin D level.  

## 2018-11-03 NOTE — Assessment & Plan Note (Signed)
Recent a1c 6.3.  Trying to watch her diet.  Has been exercising.  Follow met b and a1c.

## 2018-11-03 NOTE — Assessment & Plan Note (Signed)
On pravastatin.  Low cholesterol diet and exercise.  Follow lipid panel and liver function tests.   

## 2018-11-03 NOTE — Assessment & Plan Note (Signed)
Outside blood pressure checks wnl.  Continue same medication.  Follow pressures.  Follow metabolic panel.

## 2018-11-09 LAB — HM DEXA SCAN

## 2018-11-19 ENCOUNTER — Other Ambulatory Visit: Payer: Self-pay | Admitting: Internal Medicine

## 2018-11-30 ENCOUNTER — Telehealth: Payer: Self-pay

## 2018-11-30 NOTE — Telephone Encounter (Signed)
Left message to give pt bone density results

## 2019-04-08 ENCOUNTER — Other Ambulatory Visit: Payer: Self-pay | Admitting: Internal Medicine

## 2019-04-10 ENCOUNTER — Other Ambulatory Visit: Payer: Self-pay

## 2019-04-12 ENCOUNTER — Ambulatory Visit (INDEPENDENT_AMBULATORY_CARE_PROVIDER_SITE_OTHER): Payer: 59 | Admitting: Internal Medicine

## 2019-04-12 ENCOUNTER — Other Ambulatory Visit: Payer: Self-pay

## 2019-04-12 VITALS — BP 118/58 | HR 52 | Temp 98.0°F | Resp 16 | Ht 60.0 in | Wt 213.0 lb

## 2019-04-12 DIAGNOSIS — E039 Hypothyroidism, unspecified: Secondary | ICD-10-CM

## 2019-04-12 DIAGNOSIS — Z0000 Encounter for general adult medical examination without abnormal findings: Secondary | ICD-10-CM | POA: Diagnosis not present

## 2019-04-12 DIAGNOSIS — N289 Disorder of kidney and ureter, unspecified: Secondary | ICD-10-CM

## 2019-04-12 DIAGNOSIS — K219 Gastro-esophageal reflux disease without esophagitis: Secondary | ICD-10-CM

## 2019-04-12 DIAGNOSIS — I10 Essential (primary) hypertension: Secondary | ICD-10-CM

## 2019-04-12 DIAGNOSIS — E559 Vitamin D deficiency, unspecified: Secondary | ICD-10-CM

## 2019-04-12 DIAGNOSIS — E119 Type 2 diabetes mellitus without complications: Secondary | ICD-10-CM | POA: Diagnosis not present

## 2019-04-12 DIAGNOSIS — E7800 Pure hypercholesterolemia, unspecified: Secondary | ICD-10-CM | POA: Diagnosis not present

## 2019-04-14 ENCOUNTER — Encounter: Payer: Self-pay | Admitting: Internal Medicine

## 2019-04-14 NOTE — Assessment & Plan Note (Signed)
Sugars improving recently.  Starting to adjust her diet.  Discussed exercise.  Low carb diet and exercise. Follow met b and a1c.

## 2019-04-14 NOTE — Assessment & Plan Note (Signed)
On pravastatin.  Low cholesterol diet and exercise.  Follow lipid panel and liver function tests.   

## 2019-04-14 NOTE — Progress Notes (Signed)
Patient ID: Lorraine Foley, female   DOB: [birth year], 78 y.o.   MRN: [id number]   Subjective:    Patient ID: Lorraine Foley, female    DOB: [birth year], 78 y.o.   MRN: [id number]  HPI  Patient here for her physical exam.  States she is doing well.  Working from home.  Less stress.  She is eating more.  Has gained some weight.  Recently has started adjusting her diet.  Feels good.  No chest pain.  No sob.  No acid reflux.  No abdominal pain.  Bowels moving.  No urine change.  Blood pressure has been doing well.  States sugars were up.  Trending down now.     Past Medical History:  Diagnosis Date  . Arthritis    knees  . Diabetes mellitus ([place])   . GERD (gastroesophageal reflux disease)   . Glaucoma   . Hypercholesterolemia   . Hypertension   . Hypothyroidism   . Pneumonia    Past Surgical History:  Procedure Laterality Date  . CATARACT EXTRACTION W/PHACO Left 11/09/2016   Procedure: CATARACT EXTRACTION PHACO AND INTRAOCULAR LENS PLACEMENT ([place])  left diabetic complicated;  Surgeon: Leandrew Koyanagi, MD;  Location: [facility];  Service: Ophthalmology;  Laterality: Left;  Diabetic - oral meds  . CATARACT EXTRACTION W/PHACO Right 03/08/2017   Procedure: CATARACT EXTRACTION PHACO AND INTRAOCULAR LENS PLACEMENT ([place])  Right Diabetic complicated;  Surgeon: Leandrew Koyanagi, MD;  Location: [facility];  Service: Ophthalmology;  Laterality: Right;  . EYE SURGERY Bilateral 08/2014, 2.2016   laser surgery in preparation for glaucoma surgery  . KNEE ARTHROPLASTY Left 08/06/2018   Procedure: COMPUTER ASSISTED TOTAL KNEE ARTHROPLASTY;  Surgeon: Dereck Leep, MD;  Location: [facility];  Service: Orthopedics;  Laterality: Left;  . TOTAL HIP ARTHROPLASTY  5/13   right   Family History  Problem Relation Age of Onset  . Mental illness Father        suicide  . Liver disease Father        alcohol  . Glaucoma Father   . Alcohol abuse Father   . Blindness Father   . Glaucoma  Mother   . Osteoporosis Mother   . Diabetes Sister   . Breast cancer Sister 79  . Colon cancer Neg Hx    Social History   Socioeconomic History  . Marital status: Divorced    Spouse name: Not on file  . Number of children: 1  . Years of education: Not on file  . Highest education level: Not on file  Occupational History  . Not on file  Social Needs  . Financial resource strain: Not on file  . Food insecurity    Worry: Not on file    Inability: Not on file  . Transportation needs    Medical: Not on file    Non-medical: Not on file  Tobacco Use  . Smoking status: Never Smoker  . Smokeless tobacco: Never Used  Substance and Sexual Activity  . Alcohol use: No    Alcohol/week: 0.0 standard drinks  . Drug use: No  . Sexual activity: Not on file  Lifestyle  . Physical activity    Days per week: Not on file    Minutes per session: Not on file  . Stress: Not on file  Relationships  . Social Herbalist on phone: Not on file    Gets together: Not on file    Attends religious service: Not on file  Active member of club or organization: Not on file    Attends meetings of clubs or organizations: Not on file    Relationship status: Not on file  Other Topics Concern  . Not on file  Social History Narrative  . Not on file    Outpatient Encounter Medications as of 04/12/2019  Medication Sig  . acetaminophen (TYLENOL) 650 MG CR tablet Take 650 mg by mouth daily as needed for pain.  Marland Kitchen amLODipine (NORVASC) 5 MG tablet TAKE 1 TABLET BY MOUTH TWO  TIMES DAILY  . Camphor-Menthol-Methyl Sal (HM SALONPAS PAIN RELIEF EX) Apply 1 patch topically as needed.  . celecoxib (CELEBREX) 200 MG capsule Take 200 mg by mouth daily.  . Cholecalciferol (VITAMIN D-3) 1000 UNITS CAPS Take 1 capsule by mouth daily.  . cloNIDine (CATAPRES) 0.1 MG tablet TAKE 1 TABLET BY MOUTH 3  TIMES A DAY  . diphenhydramine-acetaminophen (TYLENOL PM) 25-500 MG TABS tablet Take 1 tablet by mouth at bedtime  as needed.  . enoxaparin (LOVENOX) 40 MG/0.4ML injection Inject 0.4 mLs (40 mg total) into the skin daily for 14 days.  Marland Kitchen glucosamine-chondroitin 500-400 MG tablet Take 1 tablet by mouth daily.  Marland Kitchen glucose blood test strip Use as instructed to check blood sugars once daily. Dx E11.9  . Lancets (ONETOUCH ULTRASOFT) lancets Use as instructed to check blood sugars once daily. Dx E11.9  . latanoprost (XALATAN) 0.005 % ophthalmic solution Place 1 drop into both eyes at bedtime. As directed   . levothyroxine (SYNTHROID) 100 MCG tablet TAKE 1 TABLET BY MOUTH  DAILY  . OVER THE COUNTER MEDICATION Take 1 tablet by mouth daily. OSTEO-SHEATH  . oxyCODONE (OXY IR/ROXICODONE) 5 MG immediate release tablet Take 1-2 tablets (5-10 mg total) by mouth every 6 (six) hours as needed for moderate pain (pain score 4-6).  . pravastatin (PRAVACHOL) 40 MG tablet TAKE 2 TABLETS BY MOUTH  DAILY  . quinapril (ACCUPRIL) 40 MG tablet TAKE 1 TABLET BY MOUTH TWO  TIMES DAILY  . SIMPLY SALINE NA Place 2 sprays into the nose daily.  Marland Kitchen spironolactone-hydrochlorothiazide (ALDACTAZIDE) 25-25 MG tablet TAKE 1 TABLET BY MOUTH  DAILY  . timolol (TIMOPTIC) 0.5 % ophthalmic solution Place 1 drop into both eyes 2 (two) times daily.   . traMADol (ULTRAM) 50 MG tablet Take 1-2 tablets (50-100 mg total) by mouth every 4 (four) hours as needed for moderate pain.  Marland Kitchen trolamine salicylate (ASPERCREME) 10 % cream Apply 1 application topically as needed for muscle pain.  . Turmeric 500 MG CAPS Take 1 capsule by mouth daily.  . vitamin C (ASCORBIC ACID) 500 MG tablet Take 500 mg by mouth daily.   No facility-administered encounter medications on file as of 04/12/2019.     Review of Systems  Constitutional: Negative for appetite change and unexpected weight change.  HENT: Negative for congestion and sinus pressure.   Eyes: Negative for pain and visual disturbance.  Respiratory: Negative for cough, chest tightness and shortness of breath.    Cardiovascular: Negative for chest pain, palpitations and leg swelling.  Gastrointestinal: Negative for abdominal pain, diarrhea, nausea and vomiting.  Genitourinary: Negative for difficulty urinating and dysuria.  Musculoskeletal: Negative for joint swelling and myalgias.  Skin: Negative for color change and rash.  Neurological: Negative for dizziness, light-headedness and headaches.  Hematological: Negative for adenopathy. Does not bruise/bleed easily.  Psychiatric/Behavioral: Negative for agitation and dysphoric mood.       Objective:    Physical Exam Constitutional:  General: She is not in acute distress.    Appearance: Normal appearance. She is well-developed.  HENT:     Right Ear: External ear normal.     Left Ear: External ear normal.  Eyes:     General: No scleral icterus.       Right eye: No discharge.        Left eye: No discharge.     Conjunctiva/sclera: Conjunctivae normal.  Neck:     Musculoskeletal: Neck supple. No muscular tenderness.     Thyroid: No thyromegaly.  Cardiovascular:     Rate and Rhythm: Normal rate and regular rhythm.  Pulmonary:     Effort: No tachypnea, accessory muscle usage or respiratory distress.     Breath sounds: Normal breath sounds. No decreased breath sounds or wheezing.  Chest:     Breasts:        Right: No inverted nipple, mass, nipple discharge or tenderness (no axillary adenopathy).        Left: No inverted nipple, mass, nipple discharge or tenderness (no axilarry adenopathy).  Abdominal:     General: Bowel sounds are normal.     Palpations: Abdomen is soft.     Tenderness: There is no abdominal tenderness.  Musculoskeletal:        General: No swelling or tenderness.  Lymphadenopathy:     Cervical: No cervical adenopathy.  Skin:    Findings: No erythema or rash.  Neurological:     Mental Status: She is alert and oriented to person, place, and time.  Psychiatric:        Mood and Affect: Mood normal.        Behavior:  Behavior normal.     BP (!) 118/58   Pulse (!) 52   Temp 98 F (36.7 C) (Temporal)   Resp 16   Ht 5' (1.524 m)   Wt 213 lb (96.6 kg)   LMP 08/11/1995   SpO2 97%   BMI 41.60 kg/m  Wt Readings from Last 3 Encounters:  04/12/19 213 lb (96.6 kg)  11/01/18 204 lb 9.6 oz (92.8 kg)  08/06/18 204 lb 5.9 oz (92.7 kg)     Lab Results  Component Value Date   WBC 6.7 07/24/2018   HGB 13.6 07/24/2018   HCT 40.0 07/24/2018   PLT 261 07/24/2018   GLUCOSE 130 (H) 07/24/2018   CHOL 199 04/20/2018   TRIG 165 (H) 04/20/2018   HDL 45 04/20/2018   LDLCALC 121 (H) 04/20/2018   ALT 15 07/24/2018   AST 17 07/24/2018   NA 139 07/24/2018   K 4.2 07/24/2018   CL 99 07/24/2018   CREATININE 1.16 (H) 07/24/2018   BUN 37 (H) 07/24/2018   CO2 29 07/24/2018   TSH 1.230 04/20/2018   INR 1.00 07/24/2018   HGBA1C 6.3 (H) 07/24/2018    Mm 3d Screen Breast Bilateral  Result Date: 09/28/2018 CLINICAL DATA:  Screening. EXAM: DIGITAL SCREENING BILATERAL MAMMOGRAM WITH TOMO AND CAD COMPARISON:  Previous exam(s). ACR Breast Density Category b: There are scattered areas of fibroglandular density. FINDINGS: There are no findings suspicious for malignancy. Images were processed with CAD. IMPRESSION: No mammographic evidence of malignancy. A result letter of this screening mammogram will be mailed directly to the patient. RECOMMENDATION: Screening mammogram in one year. (Code:SM-B-01Y) BI-RADS CATEGORY  1: Negative. Electronically Signed   By: Dorise Bullion III M.D   On: 09/28/2018 15:59       Assessment & Plan:   Problem List Items Addressed This  Visit    Diabetes mellitus ([place])    Sugars improving recently.  Starting to adjust her diet.  Discussed exercise.  Low carb diet and exercise. Follow met b and a1c.        GERD (gastroesophageal reflux disease)    Controlled on current regimen.        Healthcare maintenance    Physical today 04/13/19.  Mammogram 09/28/18 - birads I.  Colonoscopy 06/2013.         Hypercholesterolemia    On pravastatin.  Low cholesterol diet and exercise.  Follow lipid panel and liver function tests.        Hypertension    Blood pressure doing well.  Blood pressure on my check 128/68.  Follow pressures.  Continue current medication regimen.  Follow metabolic panel.       Hypothyroidism    On thyroid replacement.  Follow tsh.        Renal insufficiency    Stay hydrated.  Avoid antiinflammatories.  Follow metabolic panel.        Vitamin D deficiency    Follow vitamin D level.            Einar Pheasant, MD

## 2019-04-14 NOTE — Assessment & Plan Note (Signed)
Low carb diet and exercise.  Follow metabolic panel and a1c.   

## 2019-04-14 NOTE — Assessment & Plan Note (Signed)
Follow vitamin D level.  

## 2019-04-14 NOTE — Assessment & Plan Note (Signed)
Stay hydrated.  Avoid antiinflammatories.  Follow metabolic panel.   

## 2019-04-14 NOTE — Assessment & Plan Note (Signed)
Physical today 04/13/19.  Mammogram 09/28/18 - birads I.  Colonoscopy 06/2013.

## 2019-04-14 NOTE — Assessment & Plan Note (Signed)
On thyroid replacement.  Follow tsh.  

## 2019-04-14 NOTE — Assessment & Plan Note (Signed)
Blood pressure doing well.  Blood pressure on my check 128/68.  Follow pressures.  Continue current medication regimen.  Follow metabolic panel.

## 2019-04-14 NOTE — Assessment & Plan Note (Signed)
Controlled on current regimen.   

## 2019-04-25 ENCOUNTER — Telehealth: Payer: Self-pay | Admitting: *Deleted

## 2019-04-25 DIAGNOSIS — I10 Essential (primary) hypertension: Secondary | ICD-10-CM

## 2019-04-25 DIAGNOSIS — N289 Disorder of kidney and ureter, unspecified: Secondary | ICD-10-CM

## 2019-04-25 DIAGNOSIS — E039 Hypothyroidism, unspecified: Secondary | ICD-10-CM

## 2019-04-25 DIAGNOSIS — E119 Type 2 diabetes mellitus without complications: Secondary | ICD-10-CM

## 2019-04-25 DIAGNOSIS — E559 Vitamin D deficiency, unspecified: Secondary | ICD-10-CM

## 2019-04-25 DIAGNOSIS — E7800 Pure hypercholesterolemia, unspecified: Secondary | ICD-10-CM

## 2019-04-25 NOTE — Telephone Encounter (Signed)
Please place future orders for lab appt.  

## 2019-04-26 NOTE — Telephone Encounter (Signed)
The labs are in the system, but are ordered for [facility].  It was my understanding she is going to [employer]. She works at [employer].  Can clarify with pt.  Thanks.  (sent to both of you).  Thanks.

## 2019-04-26 NOTE — Telephone Encounter (Signed)
LMTCB

## 2019-04-26 NOTE — Telephone Encounter (Signed)
Pt is a [employer] employee but she is coming here to get her labs.

## 2019-04-26 NOTE — Telephone Encounter (Signed)
Pt called returning [place] call. Advised pt of the message below from provider. 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

## 2019-04-28 NOTE — Addendum Note (Signed)
Addended by: Alisa Graff on: 04/28/2019 10:51 PM   Modules accepted: Orders

## 2019-04-29 ENCOUNTER — Other Ambulatory Visit: Payer: Self-pay

## 2019-04-29 ENCOUNTER — Other Ambulatory Visit (INDEPENDENT_AMBULATORY_CARE_PROVIDER_SITE_OTHER): Payer: 59

## 2019-04-29 DIAGNOSIS — E039 Hypothyroidism, unspecified: Secondary | ICD-10-CM

## 2019-04-29 DIAGNOSIS — I10 Essential (primary) hypertension: Secondary | ICD-10-CM

## 2019-04-29 DIAGNOSIS — E7800 Pure hypercholesterolemia, unspecified: Secondary | ICD-10-CM

## 2019-04-29 DIAGNOSIS — E119 Type 2 diabetes mellitus without complications: Secondary | ICD-10-CM

## 2019-04-29 NOTE — Addendum Note (Signed)
Addended by: Elpidio Galea T on: 04/29/2019 09:58 AM   Modules accepted: Orders

## 2019-04-30 LAB — HEPATIC FUNCTION PANEL
ALT: 15 IU/L (ref 0–32)
AST: 16 IU/L (ref 0–40)
Albumin: 4.6 g/dL (ref 3.7–4.7)
Alkaline Phosphatase: 43 IU/L (ref 39–117)
Bilirubin Total: 0.6 mg/dL (ref 0.0–1.2)
Bilirubin, Direct: 0.13 mg/dL (ref 0.00–0.40)
Total Protein: 7.2 g/dL (ref 6.0–8.5)

## 2019-04-30 LAB — BASIC METABOLIC PANEL
BUN/Creatinine Ratio: 23 (ref 12–28)
BUN: 26 mg/dL (ref 8–27)
CO2: 26 mmol/L (ref 20–29)
Calcium: 9.6 mg/dL (ref 8.7–10.3)
Chloride: 100 mmol/L (ref 96–106)
Creatinine, Ser: 1.14 mg/dL — ABNORMAL HIGH (ref 0.57–1.00)
GFR calc Af Amer: 53 mL/min/{1.73_m2} — ABNORMAL LOW (ref >59)
GFR calc non Af Amer: 46 mL/min/{1.73_m2} — ABNORMAL LOW (ref >59)
Glucose: 131 mg/dL — ABNORMAL HIGH (ref 65–99)
Potassium: 5.1 mmol/L (ref 3.5–5.2)
Sodium: 141 mmol/L (ref 134–144)

## 2019-04-30 LAB — CBC WITH DIFFERENTIAL/PLATELET
Basophils Absolute: 0 10*3/uL (ref 0.0–0.2)
Basos: 1 %
EOS (ABSOLUTE): 0.2 10*3/uL (ref 0.0–0.4)
Eos: 4 %
Hematocrit: 40.3 % (ref 34.0–46.6)
Hemoglobin: 14.1 g/dL (ref 11.1–15.9)
Immature Grans (Abs): 0 10*3/uL (ref 0.0–0.1)
Immature Granulocytes: 0 %
Lymphocytes Absolute: 1.2 10*3/uL (ref 0.7–3.1)
Lymphs: 21 %
MCH: 31.1 pg (ref 26.6–33.0)
MCHC: 35 g/dL (ref 31.5–35.7)
MCV: 89 fL (ref 79–97)
Monocytes Absolute: 0.5 10*3/uL (ref 0.1–0.9)
Monocytes: 9 %
Neutrophils Absolute: 3.8 10*3/uL (ref 1.4–7.0)
Neutrophils: 65 %
Platelets: 250 10*3/uL (ref 150–450)
RBC: 4.53 x10E6/uL (ref 3.77–5.28)
RDW: 12.5 % (ref 11.7–15.4)
WBC: 5.8 10*3/uL (ref 3.4–10.8)

## 2019-04-30 LAB — LIPID PANEL
Chol/HDL Ratio: 3.7 ratio (ref 0.0–4.4)
Cholesterol, Total: 186 mg/dL (ref 100–199)
HDL: 50 mg/dL (ref >39)
LDL Chol Calc (NIH): 108 mg/dL — ABNORMAL HIGH (ref 0–99)
Triglycerides: 163 mg/dL — ABNORMAL HIGH (ref 0–149)
VLDL Cholesterol Cal: 28 mg/dL (ref 5–40)

## 2019-04-30 LAB — HEMOGLOBIN A1C
Est. average glucose Bld gHb Est-mCnc: 160 mg/dL
Hgb A1c MFr Bld: 7.2 % — ABNORMAL HIGH (ref 4.8–5.6)

## 2019-04-30 LAB — MICROALBUMIN / CREATININE URINE RATIO
Creatinine, Urine: 30.8 mg/dL
Microalb/Creat Ratio: <10 mg/g creat (ref 0–29)
Microalbumin, Urine: <3 ug/mL

## 2019-04-30 LAB — TSH: TSH: 1.23 u[IU]/mL (ref 0.450–4.500)

## 2019-05-03 ENCOUNTER — Telehealth: Payer: Self-pay | Admitting: *Deleted

## 2019-05-03 NOTE — Telephone Encounter (Signed)
Copied from [place] [id number]. Topic: General - Call Back - No Documentation >> May 03, 2019  9:49 AM Reyne Dumas L wrote: Reason for CRM:   Pt states she is returning the call from Dr. Bary Leriche nurse yesterday.

## 2019-05-03 NOTE — Telephone Encounter (Signed)
See result note.  

## 2019-06-10 LAB — HM DIABETES EYE EXAM

## 2019-06-11 ENCOUNTER — Other Ambulatory Visit: Payer: Self-pay | Admitting: Internal Medicine

## 2019-06-19 ENCOUNTER — Other Ambulatory Visit: Payer: Self-pay | Admitting: Internal Medicine

## 2019-06-19 ENCOUNTER — Telehealth: Payer: Self-pay

## 2019-06-19 DIAGNOSIS — E875 Hyperkalemia: Secondary | ICD-10-CM

## 2019-06-19 NOTE — Telephone Encounter (Signed)
Copied from [place] [phone]. Topic: General - Call Back - No Documentation >> Jun 19, 2019  9:31 AM Yvette Rack wrote: Reason for CRM: Pt stated she had a missed call from [place] so she was just returning the call. Pt requests call back.

## 2019-06-19 NOTE — Telephone Encounter (Signed)
F/u potassium lab ordered. Pt scheduled

## 2019-06-19 NOTE — Progress Notes (Signed)
Order placed for stat potassium check.

## 2019-06-19 NOTE — Telephone Encounter (Signed)
Spoke with pt, Scheduled her for labs. Ordered f/u potassium

## 2019-06-24 ENCOUNTER — Other Ambulatory Visit: Payer: Self-pay

## 2019-06-24 ENCOUNTER — Other Ambulatory Visit (INDEPENDENT_AMBULATORY_CARE_PROVIDER_SITE_OTHER): Payer: 59

## 2019-06-24 DIAGNOSIS — E875 Hyperkalemia: Secondary | ICD-10-CM

## 2019-06-24 NOTE — Addendum Note (Signed)
Addended by: Leeanne Rio on: 06/24/2019 03:28 PM   Modules accepted: Orders

## 2019-06-25 LAB — POTASSIUM: Potassium: 5.2 mmol/L (ref 3.5–5.2)

## 2019-06-26 ENCOUNTER — Other Ambulatory Visit: Payer: Self-pay | Admitting: Internal Medicine

## 2019-06-26 DIAGNOSIS — E875 Hyperkalemia: Secondary | ICD-10-CM

## 2019-06-26 NOTE — Progress Notes (Signed)
Order placed for f/u potassium.  

## 2019-07-08 ENCOUNTER — Other Ambulatory Visit
Admission: RE | Admit: 2019-07-08 | Discharge: 2019-07-08 | Disposition: A | Discharge status: Home / Self Care | Payer: 59 | Source: Ambulatory Visit | Attending: Internal Medicine | Admitting: Internal Medicine

## 2019-07-08 DIAGNOSIS — E875 Hyperkalemia: Secondary | ICD-10-CM | POA: Diagnosis present

## 2019-07-08 LAB — POTASSIUM: Potassium: 4.2 mmol/L (ref 3.5–5.1)

## 2019-08-12 ENCOUNTER — Other Ambulatory Visit: Payer: Self-pay

## 2019-08-12 ENCOUNTER — Ambulatory Visit (INDEPENDENT_AMBULATORY_CARE_PROVIDER_SITE_OTHER): Payer: 59 | Admitting: Internal Medicine

## 2019-08-12 ENCOUNTER — Encounter: Payer: Self-pay | Admitting: Internal Medicine

## 2019-08-12 VITALS — Ht 60.0 in | Wt 215.0 lb

## 2019-08-12 DIAGNOSIS — E039 Hypothyroidism, unspecified: Secondary | ICD-10-CM

## 2019-08-12 DIAGNOSIS — F439 Reaction to severe stress, unspecified: Secondary | ICD-10-CM

## 2019-08-12 DIAGNOSIS — I10 Essential (primary) hypertension: Secondary | ICD-10-CM | POA: Diagnosis not present

## 2019-08-12 DIAGNOSIS — K219 Gastro-esophageal reflux disease without esophagitis: Secondary | ICD-10-CM

## 2019-08-12 DIAGNOSIS — E7800 Pure hypercholesterolemia, unspecified: Secondary | ICD-10-CM

## 2019-08-12 DIAGNOSIS — E119 Type 2 diabetes mellitus without complications: Secondary | ICD-10-CM

## 2019-08-12 DIAGNOSIS — N289 Disorder of kidney and ureter, unspecified: Secondary | ICD-10-CM

## 2019-08-12 NOTE — Progress Notes (Signed)
Patient ID: Lorraine Foley, female   DOB: 1941-02-26, 78 y.o.   MRN: [id number]   Virtual Visit via video Note  This visit type was conducted due to national recommendations for restrictions regarding the COVID-19 pandemic (e.g. social distancing).  This format is felt to be most appropriate for this patient at this time.  All issues noted in this document were discussed and addressed.  No physical exam was performed (except for noted visual exam findings with Video Visits).   I connected with Lorraine Foley by a video enabled telemedicine application and verified that I am speaking with the correct person using two identifiers. Location patient: home Location provider: work  Persons participating in the virtual visit: patient, provider  The limitations, risks, security and privacy concerns of performing an evaluation and management service by video and the availability of in person appointments have been discussed.  The patient expressed understanding and agreed to proceed.   Reason for visit: scheduled follow up.    HPI: She reports she is doing relatively well.  Is working from home.  Saw Dr Marry Guan recently.  Knee/hip - doing well.  Not exercising as much.  Not able to go to gym. Does have a bike.  Discussed diet and exercise.  No chest pain or sob.  No acid reflux or abdominal pain reported.  Some depression related to covid restrictions, etc.  Discussed with her today.  She does not feel needs any further intervention.  Overall handling things relatively well.  States sugars averaging 129-140.  Blood pressures doing well.  States averaging 606-301 systolic readings.  Had questions about covid. Questions answered.  Wants to hold on mammogram.     ROS: See pertinent positives and negatives per HPI.  Past Medical History:  Diagnosis Date  . Arthritis    knees  . Diabetes mellitus ([place])   . GERD (gastroesophageal reflux disease)   . Glaucoma   . Hypercholesterolemia   . Hypertension   .  Hypothyroidism   . Pneumonia     Past Surgical History:  Procedure Laterality Date  . CATARACT EXTRACTION W/PHACO Left 11/09/2016   Procedure: CATARACT EXTRACTION PHACO AND INTRAOCULAR LENS PLACEMENT ([place])  left diabetic complicated;  Surgeon: Leandrew Koyanagi, MD;  Location: [place];  Service: Ophthalmology;  Laterality: Left;  Diabetic - oral meds  . CATARACT EXTRACTION W/PHACO Right 03/08/2017   Procedure: CATARACT EXTRACTION PHACO AND INTRAOCULAR LENS PLACEMENT ([place])  Right Diabetic complicated;  Surgeon: Leandrew Koyanagi, MD;  Location: [place];  Service: Ophthalmology;  Laterality: Right;  . EYE SURGERY Bilateral 08/2014, 2.2016   laser surgery in preparation for glaucoma surgery  . KNEE ARTHROPLASTY Left 08/06/2018   Procedure: COMPUTER ASSISTED TOTAL KNEE ARTHROPLASTY;  Surgeon: Dereck Leep, MD;  Location: [facility];  Service: Orthopedics;  Laterality: Left;  . TOTAL HIP ARTHROPLASTY  5/13   right    Family History  Problem Relation Age of Onset  . Mental illness Father        suicide  . Liver disease Father        alcohol  . Glaucoma Father   . Alcohol abuse Father   . Blindness Father   . Glaucoma Mother   . Osteoporosis Mother   . Diabetes Sister   . Breast cancer Sister 46  . Colon cancer Neg Hx     SOCIAL HX: reviewed.    Current Outpatient Medications:  .  acetaminophen (TYLENOL) 650 MG CR tablet, Take 650 mg by mouth  daily as needed for pain., Disp: , Rfl:  .  amLODipine (NORVASC) 5 MG tablet, TAKE 1 TABLET BY MOUTH TWO  TIMES DAILY, Disp: 180 tablet, Rfl: 3 .  Camphor-Menthol-Methyl Sal (HM SALONPAS PAIN RELIEF EX), Apply 1 patch topically as needed., Disp: , Rfl:  .  celecoxib (CELEBREX) 200 MG capsule, Take 200 mg by mouth daily., Disp: , Rfl:  .  Cholecalciferol (VITAMIN D-3) 1000 UNITS CAPS, Take 1 capsule by mouth daily., Disp: , Rfl:  .  cloNIDine (CATAPRES) 0.1 MG tablet, TAKE 1 TABLET BY MOUTH 3  TIMES A DAY, Disp: 270  tablet, Rfl: 3 .  COMBIGAN 0.2-0.5 % ophthalmic solution, , Disp: , Rfl:  .  diphenhydramine-acetaminophen (TYLENOL PM) 25-500 MG TABS tablet, Take 1 tablet by mouth at bedtime as needed., Disp: , Rfl:  .  enoxaparin (LOVENOX) 40 MG/0.4ML injection, Inject 0.4 mLs (40 mg total) into the skin daily for 14 days., Disp: 14 Syringe, Rfl: 0 .  glucosamine-chondroitin 500-400 MG tablet, Take 1 tablet by mouth daily., Disp: , Rfl:  .  glucose blood test strip, Use as instructed to check blood sugars once daily. Dx E11.9, Disp: 100 each, Rfl: 12 .  Lancets (ONETOUCH ULTRASOFT) lancets, Use as instructed to check blood sugars once daily. Dx E11.9, Disp: 100 each, Rfl: 12 .  latanoprost (XALATAN) 0.005 % ophthalmic solution, Place 1 drop into both eyes at bedtime. As directed , Disp: , Rfl:  .  levothyroxine (SYNTHROID) 100 MCG tablet, TAKE 1 TABLET BY MOUTH  DAILY, Disp: 90 tablet, Rfl: 3 .  OVER THE COUNTER MEDICATION, Take 1 tablet by mouth daily. OSTEO-SHEATH, Disp: , Rfl:  .  oxyCODONE (OXY IR/ROXICODONE) 5 MG immediate release tablet, Take 1-2 tablets (5-10 mg total) by mouth every 6 (six) hours as needed for moderate pain (pain score 4-6)., Disp: 30 tablet, Rfl: 0 .  pravastatin (PRAVACHOL) 40 MG tablet, TAKE 2 TABLETS BY MOUTH  DAILY, Disp: 180 tablet, Rfl: 3 .  quinapril (ACCUPRIL) 40 MG tablet, TAKE 1 TABLET BY MOUTH  TWICE DAILY, Disp: 180 tablet, Rfl: 3 .  SIMPLY SALINE NA, Place 2 sprays into the nose daily., Disp: , Rfl:  .  spironolactone-hydrochlorothiazide (ALDACTAZIDE) 25-25 MG tablet, TAKE 1 TABLET BY MOUTH  DAILY, Disp: 90 tablet, Rfl: 3 .  timolol (TIMOPTIC) 0.5 % ophthalmic solution, Place 1 drop into both eyes 2 (two) times daily. , Disp: , Rfl:  .  traMADol (ULTRAM) 50 MG tablet, Take 1-2 tablets (50-100 mg total) by mouth every 4 (four) hours as needed for moderate pain., Disp: 60 tablet, Rfl: 0 .  trolamine salicylate (ASPERCREME) 10 % cream, Apply 1 application topically as needed  for muscle pain., Disp: , Rfl:  .  Turmeric 500 MG CAPS, Take 1 capsule by mouth daily., Disp: , Rfl:  .  vitamin C (ASCORBIC ACID) 500 MG tablet, Take 500 mg by mouth daily., Disp: , Rfl:   EXAM:  GENERAL: alert, oriented, appears well and in no acute distress  HEENT: atraumatic, conjunttiva clear, no obvious abnormalities on inspection of external nose and ears  NECK: normal movements of the head and neck  LUNGS: on inspection no signs of respiratory distress, breathing rate appears normal, no obvious gross SOB, gasping or wheezing  CV: no obvious cyanosis  PSYCH/NEURO: pleasant and cooperative, no obvious depression or anxiety, speech and thought processing grossly intact  ASSESSMENT AND PLAN:  Discussed the following assessment and plan:  Diabetes mellitus Sugars as outlined.  Low  carb diet and exercise.  Follow met b and a1c.    GERD (gastroesophageal reflux disease) Controlled.  Follow.    Hypercholesterolemia On pravastatin.  Low cholesterol diet and exercise.  Follow lipid panel and liver function tests.    Hypertension Blood pressure as outlined.  Overall doing well.  Follow pressures.  Follow metabolic panel.   Hypothyroidism On  Thyroid replacement.  Follow tsh.   Renal insufficiency Stay hydrated.  Avoid antiinflammatories.  Follow met b   Stress Increased stress as outlined.  Discussed with her today.  covid questions answered.  Does not feel needs any further intervention.  Follow.      I discussed the assessment and treatment plan with the patient. The patient was provided an opportunity to ask questions and all were answered. The patient agreed with the plan and demonstrated an understanding of the instructions.   The patient was advised to call back or seek an in-person evaluation if the symptoms worsen or if the condition fails to improve as anticipated.   Einar Pheasant, MD

## 2019-08-17 ENCOUNTER — Encounter: Payer: Self-pay | Admitting: Internal Medicine

## 2019-08-17 DIAGNOSIS — F439 Reaction to severe stress, unspecified: Secondary | ICD-10-CM | POA: Insufficient documentation

## 2019-08-17 NOTE — Assessment & Plan Note (Signed)
Stay hydrated.  Avoid antiinflammatories.  Follow met b.  

## 2019-08-17 NOTE — Assessment & Plan Note (Signed)
Increased stress as outlined.  Discussed with her today.  covid questions answered.  Does not feel needs any further intervention.  Follow.

## 2019-08-17 NOTE — Assessment & Plan Note (Signed)
On  Thyroid replacement.  Follow tsh.

## 2019-08-17 NOTE — Assessment & Plan Note (Signed)
Blood pressure as outlined.  Overall doing well.  Follow pressures.  Follow metabolic panel.

## 2019-08-17 NOTE — Assessment & Plan Note (Signed)
Controlled.  Follow.   

## 2019-08-17 NOTE — Assessment & Plan Note (Signed)
Sugars as outlined.  Low carb diet and exercise.  Follow met b and a1c.

## 2019-08-17 NOTE — Assessment & Plan Note (Signed)
On pravastatin.  Low cholesterol diet and exercise.  Follow lipid panel and liver function tests.   

## 2019-08-19 ENCOUNTER — Other Ambulatory Visit
Admission: RE | Admit: 2019-08-19 | Discharge: 2019-08-19 | Disposition: A | Discharge status: Home / Self Care | Payer: 59 | Source: Ambulatory Visit | Attending: Internal Medicine | Admitting: Internal Medicine

## 2019-08-19 DIAGNOSIS — E7800 Pure hypercholesterolemia, unspecified: Secondary | ICD-10-CM | POA: Diagnosis not present

## 2019-08-19 DIAGNOSIS — E119 Type 2 diabetes mellitus without complications: Secondary | ICD-10-CM | POA: Insufficient documentation

## 2019-08-19 LAB — BASIC METABOLIC PANEL
Anion gap: 12 (ref 5–15)
BUN: 28 mg/dL — ABNORMAL HIGH (ref 8–23)
CO2: 29 mmol/L (ref 22–32)
Calcium: 9.4 mg/dL (ref 8.9–10.3)
Chloride: 97 mmol/L — ABNORMAL LOW (ref 98–111)
Creatinine, Ser: 1.27 mg/dL — ABNORMAL HIGH (ref 0.44–1.00)
GFR calc Af Amer: 47 mL/min — ABNORMAL LOW (ref >60)
GFR calc non Af Amer: 40 mL/min — ABNORMAL LOW (ref >60)
Glucose, Bld: 229 mg/dL — ABNORMAL HIGH (ref 70–99)
Potassium: 4.7 mmol/L (ref 3.5–5.1)
Sodium: 138 mmol/L (ref 135–145)

## 2019-08-19 LAB — HEPATIC FUNCTION PANEL
ALT: 17 U/L (ref 0–44)
AST: 19 U/L (ref 15–41)
Albumin: 4.3 g/dL (ref 3.5–5.0)
Alkaline Phosphatase: 45 U/L (ref 38–126)
Bilirubin, Direct: <0.1 mg/dL (ref 0.0–0.2)
Total Bilirubin: 1 mg/dL (ref 0.3–1.2)
Total Protein: 7.6 g/dL (ref 6.5–8.1)

## 2019-08-19 LAB — HEMOGLOBIN A1C
Hgb A1c MFr Bld: 9.6 % — ABNORMAL HIGH (ref 4.8–5.6)
Mean Plasma Glucose: 228.82 mg/dL

## 2019-08-19 LAB — LIPID PANEL
Cholesterol: 254 mg/dL — ABNORMAL HIGH (ref 0–200)
HDL: 45 mg/dL (ref >40)
LDL Cholesterol: 160 mg/dL — ABNORMAL HIGH (ref 0–99)
Total CHOL/HDL Ratio: 5.6 RATIO
Triglycerides: 245 mg/dL — ABNORMAL HIGH (ref <150)
VLDL: 49 mg/dL — ABNORMAL HIGH (ref 0–40)

## 2019-08-20 ENCOUNTER — Other Ambulatory Visit: Payer: Self-pay | Admitting: Internal Medicine

## 2019-08-20 DIAGNOSIS — R944 Abnormal results of kidney function studies: Secondary | ICD-10-CM

## 2019-08-20 NOTE — Progress Notes (Signed)
Order placed for f/u met b 

## 2019-09-04 ENCOUNTER — Telehealth: Payer: Self-pay | Admitting: Internal Medicine

## 2019-09-04 ENCOUNTER — Other Ambulatory Visit: Payer: Self-pay

## 2019-09-04 ENCOUNTER — Other Ambulatory Visit: Payer: Self-pay | Admitting: Internal Medicine

## 2019-09-04 DIAGNOSIS — N289 Disorder of kidney and ureter, unspecified: Secondary | ICD-10-CM

## 2019-09-04 MED ORDER — ROSUVASTATIN CALCIUM 20 MG PO TABS: 20.0000 mg | ORAL_TABLET | Freq: Every day | ORAL | 1 refills | Status: DC

## 2019-09-04 NOTE — Telephone Encounter (Signed)
Pt called back returning your call °

## 2019-09-04 NOTE — Progress Notes (Signed)
Order placed for met b 

## 2019-09-05 ENCOUNTER — Other Ambulatory Visit: Payer: Self-pay | Admitting: Internal Medicine

## 2019-09-05 DIAGNOSIS — E1165 Type 2 diabetes mellitus with hyperglycemia: Secondary | ICD-10-CM

## 2019-09-05 NOTE — Progress Notes (Signed)
Order placed for CCM referral.  

## 2019-09-09 ENCOUNTER — Other Ambulatory Visit
Admission: RE | Admit: 2019-09-09 | Discharge: 2019-09-09 | Disposition: A | Discharge status: Home / Self Care | Payer: 59 | Source: Ambulatory Visit | Attending: Internal Medicine | Admitting: Internal Medicine

## 2019-09-09 DIAGNOSIS — N289 Disorder of kidney and ureter, unspecified: Secondary | ICD-10-CM | POA: Diagnosis present

## 2019-09-09 LAB — BASIC METABOLIC PANEL
Anion gap: 9 (ref 5–15)
BUN: 33 mg/dL — ABNORMAL HIGH (ref 8–23)
CO2: 28 mmol/L (ref 22–32)
Calcium: 9.9 mg/dL (ref 8.9–10.3)
Chloride: 97 mmol/L — ABNORMAL LOW (ref 98–111)
Creatinine, Ser: 1.45 mg/dL — ABNORMAL HIGH (ref 0.44–1.00)
GFR calc Af Amer: 40 mL/min — ABNORMAL LOW (ref >60)
GFR calc non Af Amer: 34 mL/min — ABNORMAL LOW (ref >60)
Glucose, Bld: 275 mg/dL — ABNORMAL HIGH (ref 70–99)
Potassium: 5.4 mmol/L — ABNORMAL HIGH (ref 3.5–5.1)
Sodium: 134 mmol/L — ABNORMAL LOW (ref 135–145)

## 2019-09-10 ENCOUNTER — Other Ambulatory Visit: Payer: Self-pay | Admitting: Internal Medicine

## 2019-09-10 DIAGNOSIS — R944 Abnormal results of kidney function studies: Secondary | ICD-10-CM

## 2019-09-10 DIAGNOSIS — E875 Hyperkalemia: Secondary | ICD-10-CM

## 2019-09-10 NOTE — Progress Notes (Signed)
Order placed for f/u lab.   

## 2019-09-13 ENCOUNTER — Telehealth: Payer: Self-pay

## 2019-09-13 NOTE — Telephone Encounter (Signed)
Left message for patient letting her know that she needs to go to [facility] and have her labs done to check potassium ASAP. Was supposed to go today. Stopped spironolactone on 09/12/19. No other number listed to call.

## 2019-09-14 ENCOUNTER — Other Ambulatory Visit
Admission: RE | Admit: 2019-09-14 | Discharge: 2019-09-14 | Disposition: A | Discharge status: Home / Self Care | Payer: 59 | Source: Ambulatory Visit | Attending: Internal Medicine | Admitting: Internal Medicine

## 2019-09-14 ENCOUNTER — Other Ambulatory Visit: Payer: Self-pay

## 2019-09-14 DIAGNOSIS — E875 Hyperkalemia: Secondary | ICD-10-CM | POA: Diagnosis not present

## 2019-09-14 DIAGNOSIS — R944 Abnormal results of kidney function studies: Secondary | ICD-10-CM | POA: Diagnosis not present

## 2019-09-14 LAB — BASIC METABOLIC PANEL
Anion gap: 8 (ref 5–15)
BUN: 37 mg/dL — ABNORMAL HIGH (ref 8–23)
CO2: 27 mmol/L (ref 22–32)
Calcium: 9.7 mg/dL (ref 8.9–10.3)
Chloride: 96 mmol/L — ABNORMAL LOW (ref 98–111)
Creatinine, Ser: 1.4 mg/dL — ABNORMAL HIGH (ref 0.44–1.00)
GFR calc Af Amer: 42 mL/min — ABNORMAL LOW (ref >60)
GFR calc non Af Amer: 36 mL/min — ABNORMAL LOW (ref >60)
Glucose, Bld: 304 mg/dL — ABNORMAL HIGH (ref 70–99)
Potassium: 5.1 mmol/L (ref 3.5–5.1)
Sodium: 131 mmol/L — ABNORMAL LOW (ref 135–145)

## 2019-09-24 ENCOUNTER — Telehealth: Payer: Self-pay | Admitting: Internal Medicine

## 2019-09-24 ENCOUNTER — Other Ambulatory Visit: Payer: Self-pay

## 2019-09-24 ENCOUNTER — Encounter: Payer: Self-pay | Admitting: Internal Medicine

## 2019-09-24 ENCOUNTER — Ambulatory Visit (INDEPENDENT_AMBULATORY_CARE_PROVIDER_SITE_OTHER): Payer: 59 | Admitting: Internal Medicine

## 2019-09-24 VITALS — BP 142/91 | Ht 60.0 in | Wt 210.0 lb

## 2019-09-24 DIAGNOSIS — F439 Reaction to severe stress, unspecified: Secondary | ICD-10-CM

## 2019-09-24 DIAGNOSIS — N183 Chronic kidney disease, stage 3 unspecified: Secondary | ICD-10-CM

## 2019-09-24 DIAGNOSIS — E1165 Type 2 diabetes mellitus with hyperglycemia: Secondary | ICD-10-CM

## 2019-09-24 DIAGNOSIS — E7800 Pure hypercholesterolemia, unspecified: Secondary | ICD-10-CM | POA: Diagnosis not present

## 2019-09-24 DIAGNOSIS — E875 Hyperkalemia: Secondary | ICD-10-CM

## 2019-09-24 DIAGNOSIS — E039 Hypothyroidism, unspecified: Secondary | ICD-10-CM

## 2019-09-24 DIAGNOSIS — I10 Essential (primary) hypertension: Secondary | ICD-10-CM

## 2019-09-24 NOTE — Chronic Care Management (AMB) (Signed)
  Chronic Care Management   Outreach Note  09/24/2019 Name: Lorraine Foley MRN: [id number] DOB: 1941-03-30  Lorraine Foley is a 79 y.o. year old female who is a primary care patient of Dale [place], MD. I reached out to [name] by phone today in response to a referral sent by Ms. Zenovia Jarred Atienza's PCP, Dr. Dale Dubois     An unsuccessful telephone outreach was attempted today. The patient was referred to the case management team by for assistance with care management and care coordination.   Follow Up Plan: A HIPPA compliant phone message was left for the patient providing contact information and requesting a return call.  The care management team will reach out to the patient again over the next 7 days.  If patient returns call to provider office, please advise to call Embedded Care Management Care Guide Elisha Ponder LPN at [phone]  Elisha Ponder, LPN Health Advisor, Embedded Care Coordination [facility] Care Management ??[email or web address] ??[phone]

## 2019-09-24 NOTE — Chronic Care Management (AMB) (Signed)
  Chronic Care Management   Note  09/24/2019 Name: Lorraine Foley MRN: [id number] DOB: 12-27-1940  Lorraine Foley is a 79 y.o. year old female who is a primary care patient of Einar Pheasant, MD. I reached out to Alanson Puls by phone today in response to a referral sent by [name]'s PCP, Dr. Einar Pheasant     [name] was given information about Chronic Care Management services today including:  1. CCM service includes personalized support from designated clinical staff supervised by her physician, including individualized plan of care and coordination with other care providers 2. 24/7 contact phone numbers for assistance for urgent and routine care needs. 3. Service will only be billed when office clinical staff spend 20 minutes or more in a month to coordinate care. 4. Only one practitioner may furnish and bill the service in a calendar month. 5. The patient may stop CCM services at any time (effective at the end of the month) by phone call to the office staff. 6. The patient will be responsible for cost sharing (co-pay) of up to 20% of the service fee (after annual deductible is met).  Patient did not agree to enrollment in care management services and does not wish to consider at this time.  Follow up plan: The care management team is available to follow up with the patient after provider conversation with the patient regarding recommendation for care management engagement and subsequent re-referral to the care management team.   Glenna Durand, LPN Health Advisor, [facility] ??[email or web address] ??[phone]

## 2019-09-24 NOTE — Progress Notes (Signed)
Patient ID: Lorraine Foley, female   DOB: 1941/06/19, 79 y.o.   MRN: [id number]   Virtual Visit via telephone Note  This visit type was conducted due to national recommendations for restrictions regarding the COVID-19 pandemic (e.g. social distancing).  This format is felt to be most appropriate for this patient at this time.  All issues noted in this document were discussed and addressed.  No physical exam was performed (except for noted visual exam findings with Video Visits).   I connected with Lorraine Foley by telephone and verified that I am speaking with the correct person using two identifiers. Location patient: home Location provider: work  Persons participating in the telephone visit: patient, provider  I discussed the limitations, risks, security and privacy concerns of performing an evaluation and management service by telephone and the availability of in person appointments. The patient expressed understanding and agreed to proceed.   Reason for visit: work in appt.   HPI: Some increased stress recently.  Worrying about her lab results - increased potassium, elevated blood sugar, etc.  She reports that since halloween, she has not been eating as well.  Was drinking sodas and eating peanuts, snacks and not watching her diet.  Recent labs revealed elevated blood sugar. Kidney function decreased.  Potassium increased.  Was ask to hold spironolactone.  She did for a couple of days, but is back on the medication now.  No chest pain.  No sob.  No acid reflux.  No abdominal pain.  Bowels moving.  She has started watching her diet again.  Has cut out potatoes.  Back to drinking waer.  Sleeping better.  Sugars are starting to trend down now.  Blood pressures averaging 130-140/90.  Discussed high potassium foods - avoidance.     ROS: See pertinent positives and negatives per HPI.  Past Medical History:  Diagnosis Date  . Arthritis    knees  . Diabetes mellitus ([place])   . GERD  (gastroesophageal reflux disease)   . Glaucoma   . Hypercholesterolemia   . Hypertension   . Hypothyroidism   . Pneumonia     Past Surgical History:  Procedure Laterality Date  . CATARACT EXTRACTION W/PHACO Left 11/09/2016   Procedure: CATARACT EXTRACTION PHACO AND INTRAOCULAR LENS PLACEMENT ([place])  left diabetic complicated;  Surgeon: Leandrew Koyanagi, MD;  Location: [place];  Service: Ophthalmology;  Laterality: Left;  Diabetic - oral meds  . CATARACT EXTRACTION W/PHACO Right 03/08/2017   Procedure: CATARACT EXTRACTION PHACO AND INTRAOCULAR LENS PLACEMENT ([place])  Right Diabetic complicated;  Surgeon: Leandrew Koyanagi, MD;  Location: [place];  Service: Ophthalmology;  Laterality: Right;  . EYE SURGERY Bilateral 08/2014, 2.2016   laser surgery in preparation for glaucoma surgery  . KNEE ARTHROPLASTY Left 08/06/2018   Procedure: COMPUTER ASSISTED TOTAL KNEE ARTHROPLASTY;  Surgeon: Dereck Leep, MD;  Location: [facility];  Service: Orthopedics;  Laterality: Left;  . TOTAL HIP ARTHROPLASTY  5/13   right    Family History  Problem Relation Age of Onset  . Mental illness Father        suicide  . Liver disease Father        alcohol  . Glaucoma Father   . Alcohol abuse Father   . Blindness Father   . Glaucoma Mother   . Osteoporosis Mother   . Diabetes Sister   . Breast cancer Sister 84  . Colon cancer Neg Hx     SOCIAL HX: reviewed.    Current Outpatient  Medications:  .  acetaminophen (TYLENOL) 650 MG CR tablet, Take 650 mg by mouth daily as needed for pain., Disp: , Rfl:  .  amLODipine (NORVASC) 5 MG tablet, TAKE 1 TABLET BY MOUTH TWO  TIMES DAILY, Disp: 180 tablet, Rfl: 3 .  Cholecalciferol (VITAMIN D-3) 1000 UNITS CAPS, Take 1 capsule by mouth daily., Disp: , Rfl:  .  cloNIDine (CATAPRES) 0.1 MG tablet, TAKE 1 TABLET BY MOUTH 3  TIMES A DAY, Disp: 270 tablet, Rfl: 3 .  COMBIGAN 0.2-0.5 % ophthalmic solution, , Disp: , Rfl:  .  Lancets (ONETOUCH  ULTRASOFT) lancets, Use as instructed to check blood sugars once daily. Dx E11.9, Disp: 100 each, Rfl: 12 .  latanoprost (XALATAN) 0.005 % ophthalmic solution, Place 1 drop into both eyes at bedtime. As directed , Disp: , Rfl:  .  levothyroxine (SYNTHROID) 100 MCG tablet, TAKE 1 TABLET BY MOUTH  DAILY, Disp: 90 tablet, Rfl: 3 .  pravastatin (PRAVACHOL) 40 MG tablet, TAKE 2 TABLETS BY MOUTH  DAILY, Disp: 180 tablet, Rfl: 3 .  quinapril (ACCUPRIL) 40 MG tablet, TAKE 1 TABLET BY MOUTH  TWICE DAILY, Disp: 180 tablet, Rfl: 3 .  spironolactone-hydrochlorothiazide (ALDACTAZIDE) 25-25 MG tablet, TAKE 1 TABLET BY MOUTH  DAILY, Disp: 90 tablet, Rfl: 3 .  Turmeric 500 MG CAPS, Take 1 capsule by mouth daily., Disp: , Rfl:  .  vitamin C (ASCORBIC ACID) 500 MG tablet, Take 500 mg by mouth daily., Disp: , Rfl:  .  enoxaparin (LOVENOX) 40 MG/0.4ML injection, Inject 0.4 mLs (40 mg total) into the skin daily for 14 days., Disp: 14 Syringe, Rfl: 0 .  glucose blood test strip, Use as instructed to check blood sugars once daily. Dx E11.9, Disp: 100 each, Rfl: 12 .  rosuvastatin (CRESTOR) 20 MG tablet, Take 1 tablet (20 mg total) by mouth daily. (Patient not taking: Reported on 09/24/2019), Disp: 90 tablet, Rfl: 1  EXAM:  GENERAL: alert.  Sounds to be in no acute distress.  Answering questions appropriately.    PSYCH/NEURO: pleasant and cooperative, no obvious depression or anxiety, speech and thought processing grossly intact  ASSESSMENT AND PLAN:  Discussed the following assessment and plan:  Diabetes mellitus On recent check a1c elevated.  Discussed diet and exercise.  Sugars starting to trend down.  Continue diet adjustment.  Follow met b and a1c.    Hypercholesterolemia On pravastatin.  Low cholesterol diet and exercise.  Follow lipid panel and liver function tests.   Hypertension Blood pressure as outlined.  Continue current medication.  Follow pressures.  Follow metabolic panel.    Hypothyroidism On  thyroid replacement.  Follow tsh.   Stress Increased stress.  Discussed with her today.  Overall she feels she feels she is handling things relatively well.  Follow.  Does not feel needs any further intervention.   CKD (chronic kidney disease), stage III Avoid antiinflammatories.  Was off spironolactone/hctz.  Back on now.  Only off for brief period. Follow.     Orders Placed This Encounter  Procedures  . Basic metabolic panel     I discussed the assessment and treatment plan with the patient. The patient was provided an opportunity to ask questions and all were answered. The patient agreed with the plan and demonstrated an understanding of the instructions.   The patient was advised to call back or seek an in-person evaluation if the symptoms worsen or if the condition fails to improve as anticipated.  I provided 22 minutes of non-face-to-face time  during this encounter.   Einar Pheasant, MD

## 2019-09-27 ENCOUNTER — Telehealth: Payer: Self-pay | Admitting: Internal Medicine

## 2019-09-27 LAB — BASIC METABOLIC PANEL
BUN/Creatinine Ratio: 21 (ref 12–28)
BUN: 27 mg/dL (ref 8–27)
CO2: 26 mmol/L (ref 20–29)
Calcium: 9.4 mg/dL (ref 8.7–10.3)
Chloride: 100 mmol/L (ref 96–106)
Creatinine, Ser: 1.27 mg/dL — ABNORMAL HIGH (ref 0.57–1.00)
GFR calc Af Amer: 47 mL/min/{1.73_m2} — ABNORMAL LOW (ref >59)
GFR calc non Af Amer: 41 mL/min/{1.73_m2} — ABNORMAL LOW (ref >59)
Glucose: 193 mg/dL — ABNORMAL HIGH (ref 65–99)
Potassium: 5 mmol/L (ref 3.5–5.2)
Sodium: 140 mmol/L (ref 134–144)

## 2019-09-27 NOTE — Telephone Encounter (Signed)
Jasmine stat , BMP , Glucose 193 creatinine 1.27 high,  GFR 41 low Non African american, GFR 47 low african american   Stat LAB Results.

## 2019-09-27 NOTE — Telephone Encounter (Signed)
These are not resulted in the chart.  Do you know what her potassium level was or how I can get copy of all the labs

## 2019-09-27 NOTE — Telephone Encounter (Signed)
They gave me all out of range and will send rest through chart . Lorraine Foley can you look these up?

## 2019-09-27 NOTE — Telephone Encounter (Signed)
It has been updated in the chart.

## 2019-09-27 NOTE — Telephone Encounter (Signed)
Thank you and thank you for all of your help.

## 2019-09-28 ENCOUNTER — Encounter: Payer: Self-pay | Admitting: Internal Medicine

## 2019-09-28 DIAGNOSIS — N183 Chronic kidney disease, stage 3 unspecified: Secondary | ICD-10-CM | POA: Insufficient documentation

## 2019-09-28 NOTE — Assessment & Plan Note (Signed)
Avoid antiinflammatories.  Was off spironolactone/hctz.  Back on now.  Only off for brief period. Follow.

## 2019-09-28 NOTE — Assessment & Plan Note (Signed)
Increased stress.  Discussed with her today.  Overall she feels she feels she is handling things relatively well.  Follow.  Does not feel needs any further intervention.

## 2019-09-28 NOTE — Assessment & Plan Note (Signed)
Blood pressure as outlined.  Continue current medication.  Follow pressures.  Follow metabolic panel.  

## 2019-09-28 NOTE — Assessment & Plan Note (Signed)
On recent check a1c elevated.  Discussed diet and exercise.  Sugars starting to trend down.  Continue diet adjustment.  Follow met b and a1c.

## 2019-09-28 NOTE — Assessment & Plan Note (Signed)
On thyroid replacement.  Follow tsh.  

## 2019-09-28 NOTE — Assessment & Plan Note (Signed)
On pravastatin.  Low cholesterol diet and exercise.  Follow lipid panel and liver function tests.   

## 2019-12-23 ENCOUNTER — Other Ambulatory Visit: Payer: Self-pay

## 2019-12-23 ENCOUNTER — Encounter: Payer: Self-pay | Admitting: Internal Medicine

## 2019-12-23 ENCOUNTER — Telehealth (INDEPENDENT_AMBULATORY_CARE_PROVIDER_SITE_OTHER): Payer: 59 | Admitting: Internal Medicine

## 2019-12-23 VITALS — Ht 60.0 in | Wt 200.0 lb

## 2019-12-23 DIAGNOSIS — E7800 Pure hypercholesterolemia, unspecified: Secondary | ICD-10-CM

## 2019-12-23 DIAGNOSIS — E1165 Type 2 diabetes mellitus with hyperglycemia: Secondary | ICD-10-CM | POA: Diagnosis not present

## 2019-12-23 DIAGNOSIS — N183 Chronic kidney disease, stage 3 unspecified: Secondary | ICD-10-CM

## 2019-12-23 DIAGNOSIS — Z1231 Encounter for screening mammogram for malignant neoplasm of breast: Secondary | ICD-10-CM

## 2019-12-23 DIAGNOSIS — F439 Reaction to severe stress, unspecified: Secondary | ICD-10-CM

## 2019-12-23 DIAGNOSIS — E119 Type 2 diabetes mellitus without complications: Secondary | ICD-10-CM

## 2019-12-23 DIAGNOSIS — N289 Disorder of kidney and ureter, unspecified: Secondary | ICD-10-CM

## 2019-12-23 DIAGNOSIS — E039 Hypothyroidism, unspecified: Secondary | ICD-10-CM

## 2019-12-23 DIAGNOSIS — I10 Essential (primary) hypertension: Secondary | ICD-10-CM

## 2019-12-23 NOTE — Progress Notes (Signed)
Patient ID: Lorraine Foley, female   DOB: [birth year], 79 y.o.   MRN: [id number]   Virtual Visit via telephone Note  This visit type was conducted due to national recommendations for restrictions regarding the COVID-19 pandemic (e.g. social distancing).  This format is felt to be most appropriate for this patient at this time.  All issues noted in this document were discussed and addressed.  No physical exam was performed (except for noted visual exam findings with Video Visits).   I connected with Lorraine Foley by telephone and verified that I am speaking with the correct person using two identifiers. Location patient: home Location provider: work Persons participating in the telephone visit: patient, provider  The limitations, risks, security and privacy concerns of performing an evaluation and management service by telephone and the availability of in person appointments have been discussed.  The patient expressed understanding and agreed to proceed.   Reason for visit: scheduled follow up  HPI: She reports she is doing relatively well.  Has tried to decrease carb intake.  Has lost 10 pounds.  Feels better.  No chest pain or increased sob reported.  No heartburn now.  Previous back ache.  Better now.  Adjusted the way she was sitting.  Changed chairs.  Discussed getting up and moving around.  No acid reflux or abdominal pain reported.  Bowels stable.  AM sugars averaging 130s and PM sugars 140-160s.  Highest blood pressure reading 147/98.  Has been better.  No headache or dizziness reported.     ROS: See pertinent positives and negatives per HPI.  Past Medical History:  Diagnosis Date  . Arthritis    knees  . Diabetes mellitus ([place])   . GERD (gastroesophageal reflux disease)   . Glaucoma   . Hypercholesterolemia   . Hypertension   . Hypothyroidism   . Pneumonia     Past Surgical History:  Procedure Laterality Date  . CATARACT EXTRACTION W/PHACO Left 11/09/2016   Procedure: CATARACT  EXTRACTION PHACO AND INTRAOCULAR LENS PLACEMENT ([place])  left diabetic complicated;  Surgeon: Leandrew Koyanagi, MD;  Location: [facility];  Service: Ophthalmology;  Laterality: Left;  Diabetic - oral meds  . CATARACT EXTRACTION W/PHACO Right 03/08/2017   Procedure: CATARACT EXTRACTION PHACO AND INTRAOCULAR LENS PLACEMENT ([place])  Right Diabetic complicated;  Surgeon: Leandrew Koyanagi, MD;  Location: [facility];  Service: Ophthalmology;  Laterality: Right;  . EYE SURGERY Bilateral 08/2014, 2.2016   laser surgery in preparation for glaucoma surgery  . KNEE ARTHROPLASTY Left 08/06/2018   Procedure: COMPUTER ASSISTED TOTAL KNEE ARTHROPLASTY;  Surgeon: Dereck Leep, MD;  Location: [facility];  Service: Orthopedics;  Laterality: Left;  . TOTAL HIP ARTHROPLASTY  5/13   right    Family History  Problem Relation Age of Onset  . Mental illness Father        suicide  . Liver disease Father        alcohol  . Glaucoma Father   . Alcohol abuse Father   . Blindness Father   . Glaucoma Mother   . Osteoporosis Mother   . Diabetes Sister   . Breast cancer Sister 36  . Colon cancer Neg Hx     SOCIAL HX: reviewed.    Current Outpatient Medications:  .  acetaminophen (TYLENOL) 650 MG CR tablet, Take 650 mg by mouth daily as needed for pain., Disp: , Rfl:  .  amLODipine (NORVASC) 5 MG tablet, TAKE 1 TABLET BY MOUTH TWO  TIMES DAILY, Disp: 180  tablet, Rfl: 3 .  Cholecalciferol (VITAMIN D-3) 1000 UNITS CAPS, Take 1 capsule by mouth daily., Disp: , Rfl:  .  cloNIDine (CATAPRES) 0.1 MG tablet, TAKE 1 TABLET BY MOUTH 3  TIMES A DAY, Disp: 270 tablet, Rfl: 3 .  COMBIGAN 0.2-0.5 % ophthalmic solution, , Disp: , Rfl:  .  glucose blood test strip, Use as instructed to check blood sugars once daily. Dx E11.9, Disp: 100 each, Rfl: 12 .  Lancets (ONETOUCH ULTRASOFT) lancets, Use as instructed to check blood sugars once daily. Dx E11.9, Disp: 100 each, Rfl: 12 .  latanoprost (XALATAN) 0.005 %  ophthalmic solution, Place 1 drop into both eyes at bedtime. As directed , Disp: , Rfl:  .  levothyroxine (SYNTHROID) 100 MCG tablet, TAKE 1 TABLET BY MOUTH  DAILY, Disp: 90 tablet, Rfl: 3 .  pravastatin (PRAVACHOL) 40 MG tablet, TAKE 2 TABLETS BY MOUTH  DAILY, Disp: 180 tablet, Rfl: 3 .  quinapril (ACCUPRIL) 40 MG tablet, TAKE 1 TABLET BY MOUTH  TWICE DAILY, Disp: 180 tablet, Rfl: 3 .  rosuvastatin (CRESTOR) 20 MG tablet, Take 1 tablet (20 mg total) by mouth daily. (Patient not taking: Reported on 09/24/2019), Disp: 90 tablet, Rfl: 1 .  spironolactone-hydrochlorothiazide (ALDACTAZIDE) 25-25 MG tablet, TAKE 1 TABLET BY MOUTH  DAILY, Disp: 90 tablet, Rfl: 3 .  Turmeric 500 MG CAPS, Take 1 capsule by mouth daily., Disp: , Rfl:  .  vitamin C (ASCORBIC ACID) 500 MG tablet, Take 500 mg by mouth daily., Disp: , Rfl:   EXAM:  GENERAL: alert. Sounds to be in no acute distress.  Answering questions appropriately.    PSYCH/NEURO: pleasant and cooperative, no obvious depression or anxiety, speech and thought processing grossly intact  ASSESSMENT AND PLAN:  Discussed the following assessment and plan:  Type 2 diabetes mellitus without complications ([place]) Has adjusted carb intake.  Discussed diet and exercise.  Has lost weight.  Sugars as outlined.  Appear to be improved.  Follow met b and a1c.   Stress Overall appears to be handling things well.  Follow.    Renal insufficiency Stay hydrated.  Avoid antiinflammatories.  Follow met b.   Hypothyroidism On thyroid replacement.  Follow tsh.    Hypertension Blood pressure as outlined.  She is adjusting her diet.  Losing weight.  Has been better.  Hold on making changes.  Continue amlodipine and quinapril.  Follow pressures.  Follow metabolic panel.   Hypercholesterolemia On pravastatin.  Low cholesterol diet and exercise.  Follow lipid panel and liver function tests.     Orders Placed This Encounter  Procedures  . MM 3D SCREEN BREAST BILATERAL      Standing Status:   Future    Standing Expiration Date:   02/21/2021    Order Specific Question:   Reason for Exam (SYMPTOM  OR DIAGNOSIS REQUIRED)    Answer:   [facility] MAMMO    Order Specific Question:   Preferred imaging location?    Answer:   [facility]  . Hemoglobin A1c  . Hepatic function panel  . Lipid panel  . Basic metabolic panel  . TSH     I discussed the assessment and treatment plan with the patient. The patient was provided an opportunity to ask questions and all were answered. The patient agreed with the plan and demonstrated an understanding of the instructions.   The patient was advised to call back or seek an in-person evaluation if the symptoms worsen or if the condition fails to improve  as anticipated.  I provided 25 minutes of non-face-to-face time during this encounter.   Einar Pheasant, MD

## 2019-12-26 LAB — HEMOGLOBIN A1C
Est. average glucose Bld gHb Est-mCnc: 171 mg/dL
Hgb A1c MFr Bld: 7.6 % — ABNORMAL HIGH (ref 4.8–5.6)

## 2019-12-26 LAB — TSH: TSH: 1.04 u[IU]/mL (ref 0.450–4.500)

## 2019-12-26 LAB — LIPID PANEL
Chol/HDL Ratio: 3 ratio (ref 0.0–4.4)
Cholesterol, Total: 132 mg/dL (ref 100–199)
HDL: 44 mg/dL (ref >39)
LDL Chol Calc (NIH): 61 mg/dL (ref 0–99)
Triglycerides: 157 mg/dL — ABNORMAL HIGH (ref 0–149)
VLDL Cholesterol Cal: 27 mg/dL (ref 5–40)

## 2019-12-26 LAB — BASIC METABOLIC PANEL
BUN/Creatinine Ratio: 23 (ref 12–28)
BUN: 26 mg/dL (ref 8–27)
CO2: 27 mmol/L (ref 20–29)
Calcium: 10.1 mg/dL (ref 8.7–10.3)
Chloride: 95 mmol/L — ABNORMAL LOW (ref 96–106)
Creatinine, Ser: 1.13 mg/dL — ABNORMAL HIGH (ref 0.57–1.00)
GFR calc Af Amer: 54 mL/min/{1.73_m2} — ABNORMAL LOW (ref >59)
GFR calc non Af Amer: 47 mL/min/{1.73_m2} — ABNORMAL LOW (ref >59)
Glucose: 133 mg/dL — ABNORMAL HIGH (ref 65–99)
Potassium: 5.2 mmol/L (ref 3.5–5.2)
Sodium: 135 mmol/L (ref 134–144)

## 2019-12-26 LAB — HEPATIC FUNCTION PANEL
ALT: 16 IU/L (ref 0–32)
AST: 15 IU/L (ref 0–40)
Albumin: 4.4 g/dL (ref 3.7–4.7)
Alkaline Phosphatase: 50 IU/L (ref 39–117)
Bilirubin Total: 0.5 mg/dL (ref 0.0–1.2)
Bilirubin, Direct: 0.15 mg/dL (ref 0.00–0.40)
Total Protein: 7 g/dL (ref 6.0–8.5)

## 2019-12-29 ENCOUNTER — Encounter: Payer: Self-pay | Admitting: Internal Medicine

## 2019-12-29 NOTE — Assessment & Plan Note (Signed)
Stay hydrated.  Avoid antiinflammatories.  Follow met b.  

## 2019-12-29 NOTE — Assessment & Plan Note (Signed)
On thyroid replacement.  Follow tsh.  

## 2019-12-29 NOTE — Assessment & Plan Note (Signed)
Has adjusted carb intake.  Discussed diet and exercise.  Has lost weight.  Sugars as outlined.  Appear to be improved.  Follow met b and a1c.  

## 2019-12-29 NOTE — Assessment & Plan Note (Signed)
Overall appears to be handling things well.  Follow.  ?

## 2019-12-29 NOTE — Assessment & Plan Note (Signed)
Blood pressure as outlined.  She is adjusting her diet.  Losing weight.  Has been better.  Hold on making changes.  Continue amlodipine and quinapril.  Follow pressures.  Follow metabolic panel.

## 2019-12-29 NOTE — Assessment & Plan Note (Signed)
On pravastatin.  Low cholesterol diet and exercise.  Follow lipid panel and liver function tests.   

## 2020-02-16 ENCOUNTER — Other Ambulatory Visit: Payer: Self-pay | Admitting: Internal Medicine

## 2020-02-26 ENCOUNTER — Other Ambulatory Visit: Payer: Self-pay | Admitting: Internal Medicine

## 2020-03-05 ENCOUNTER — Encounter: Payer: Self-pay | Admitting: Internal Medicine

## 2020-04-14 ENCOUNTER — Other Ambulatory Visit: Payer: Self-pay

## 2020-04-14 ENCOUNTER — Ambulatory Visit
Admission: RE | Admit: 2020-04-14 | Discharge: 2020-04-14 | Disposition: A | Discharge status: Home / Self Care | Payer: 59 | Source: Ambulatory Visit | Attending: Internal Medicine | Admitting: Internal Medicine

## 2020-04-14 DIAGNOSIS — Z1231 Encounter for screening mammogram for malignant neoplasm of breast: Secondary | ICD-10-CM

## 2020-04-29 ENCOUNTER — Other Ambulatory Visit: Payer: Self-pay

## 2020-04-29 ENCOUNTER — Ambulatory Visit (INDEPENDENT_AMBULATORY_CARE_PROVIDER_SITE_OTHER): Payer: 59 | Admitting: Internal Medicine

## 2020-04-29 ENCOUNTER — Encounter: Payer: Self-pay | Admitting: Internal Medicine

## 2020-04-29 VITALS — BP 138/68 | HR 50 | Temp 97.5°F | Ht 60.0 in | Wt 201.8 lb

## 2020-04-29 DIAGNOSIS — Z1211 Encounter for screening for malignant neoplasm of colon: Secondary | ICD-10-CM

## 2020-04-29 DIAGNOSIS — E559 Vitamin D deficiency, unspecified: Secondary | ICD-10-CM | POA: Diagnosis not present

## 2020-04-29 DIAGNOSIS — E7800 Pure hypercholesterolemia, unspecified: Secondary | ICD-10-CM

## 2020-04-29 DIAGNOSIS — N183 Chronic kidney disease, stage 3 unspecified: Secondary | ICD-10-CM

## 2020-04-29 DIAGNOSIS — Z0000 Encounter for general adult medical examination without abnormal findings: Secondary | ICD-10-CM | POA: Diagnosis not present

## 2020-04-29 DIAGNOSIS — E119 Type 2 diabetes mellitus without complications: Secondary | ICD-10-CM | POA: Diagnosis not present

## 2020-04-29 DIAGNOSIS — I10 Essential (primary) hypertension: Secondary | ICD-10-CM

## 2020-04-29 DIAGNOSIS — F439 Reaction to severe stress, unspecified: Secondary | ICD-10-CM

## 2020-04-29 DIAGNOSIS — E039 Hypothyroidism, unspecified: Secondary | ICD-10-CM

## 2020-04-29 NOTE — Assessment & Plan Note (Addendum)
Physical today 04/29/20.  Mammogram 04/14/20 - birads I.  colonosocpy 06/2013.  Hemoccult cards given.

## 2020-04-29 NOTE — Progress Notes (Signed)
Patient ID: Lorraine Foley, female   DOB: 17-Aug-1941, 79 y.o.   MRN: [id number]   Subjective:    Patient ID: Lorraine Foley, female    DOB: [birth year], 79 y.o.   MRN: [id number]  HPI This visit occurred during the SARS-CoV-2 public health emergency.  Safety protocols were in place, including screening questions prior to the visit, additional usage of staff PPE, and extensive cleaning of exam room while observing appropriate contact time as indicated for disinfecting solutions.  Patient here for her physical exam.  She reports she is doing relatively well.  Increased stress with work.  Discussed with her today.  She does not feel she needs any further intervention at this time, but will let me know.  Not exercising as much due to working overtime.  No chest pain or sob reported. No abdominal pain or bowel change.  States sugars are averaging 120-140s.  Blood pressures doing well.  Highest reading 102 systolic.  Taking her medication.  Had questions about covid booster.    Past Medical History:  Diagnosis Date  . Arthritis    knees  . Diabetes mellitus ([place])   . GERD (gastroesophageal reflux disease)   . Glaucoma   . Hypercholesterolemia   . Hypertension   . Hypothyroidism   . Pneumonia    Past Surgical History:  Procedure Laterality Date  . CATARACT EXTRACTION W/PHACO Left 11/09/2016   Procedure: CATARACT EXTRACTION PHACO AND INTRAOCULAR LENS PLACEMENT ([place])  left diabetic complicated;  Surgeon: Leandrew Koyanagi, MD;  Location: [facility];  Service: Ophthalmology;  Laterality: Left;  Diabetic - oral meds  . CATARACT EXTRACTION W/PHACO Right 03/08/2017   Procedure: CATARACT EXTRACTION PHACO AND INTRAOCULAR LENS PLACEMENT ([place])  Right Diabetic complicated;  Surgeon: Leandrew Koyanagi, MD;  Location: [facility];  Service: Ophthalmology;  Laterality: Right;  . EYE SURGERY Bilateral 08/2014, 2.2016   laser surgery in preparation for glaucoma surgery  . KNEE ARTHROPLASTY Left  08/06/2018   Procedure: COMPUTER ASSISTED TOTAL KNEE ARTHROPLASTY;  Surgeon: Dereck Leep, MD;  Location: [facility];  Service: Orthopedics;  Laterality: Left;  . TOTAL HIP ARTHROPLASTY  5/13   right   Family History  Problem Relation Age of Onset  . Mental illness Father        suicide  . Liver disease Father        alcohol  . Glaucoma Father   . Alcohol abuse Father   . Blindness Father   . Glaucoma Mother   . Osteoporosis Mother   . Diabetes Sister   . Breast cancer Sister 1  . Colon cancer Neg Hx    Social History   Socioeconomic History  . Marital status: Divorced    Spouse name: Not on file  . Number of children: 1  . Years of education: Not on file  . Highest education level: Not on file  Occupational History  . Not on file  Tobacco Use  . Smoking status: Never Smoker  . Smokeless tobacco: Never Used  Vaping Use  . Vaping Use: Never used  Substance and Sexual Activity  . Alcohol use: No    Alcohol/week: 0.0 standard drinks  . Drug use: No  . Sexual activity: Not on file  Other Topics Concern  . Not on file  Social History Narrative  . Not on file   Social Determinants of Health   Financial Resource Strain:   . Difficulty of Paying Living Expenses: Not on file  Food Insecurity:   .  Worried About Charity fundraiser in the Last Year: Not on file  . Ran Out of Food in the Last Year: Not on file  Transportation Needs:   . Lack of Transportation (Medical): Not on file  . Lack of Transportation (Non-Medical): Not on file  Physical Activity:   . Days of Exercise per Week: Not on file  . Minutes of Exercise per Session: Not on file  Stress:   . Feeling of Stress : Not on file  Social Connections:   . Frequency of Communication with Friends and Family: Not on file  . Frequency of Social Gatherings with Friends and Family: Not on file  . Attends Religious Services: Not on file  . Active Member of Clubs or Organizations: Not on file  . Attends Theatre manager Meetings: Not on file  . Marital Status: Not on file    Outpatient Encounter Medications as of 04/29/2020  Medication Sig  . acetaminophen (TYLENOL) 650 MG CR tablet Take 650 mg by mouth daily as needed for pain.  Marland Kitchen amLODipine (NORVASC) 5 MG tablet TAKE 1 TABLET BY MOUTH  TWICE DAILY  . Cholecalciferol (VITAMIN D-3) 1000 UNITS CAPS Take 1 capsule by mouth daily.  . cloNIDine (CATAPRES) 0.1 MG tablet TAKE 1 TABLET BY MOUTH 3  TIMES DAILY  . glucose blood test strip Use as instructed to check blood sugars once daily. Dx E11.9  . Lancets (ONETOUCH ULTRASOFT) lancets Use as instructed to check blood sugars once daily. Dx E11.9  . levothyroxine (SYNTHROID) 100 MCG tablet TAKE 1 TABLET BY MOUTH  DAILY  . quinapril (ACCUPRIL) 40 MG tablet TAKE 1 TABLET BY MOUTH  TWICE DAILY  . ROCKLATAN 0.02-0.005 % SOLN Apply to eye.  . rosuvastatin (CRESTOR) 20 MG tablet TAKE 1 TABLET(20 MG) BY MOUTH DAILY  . spironolactone-hydrochlorothiazide (ALDACTAZIDE) 25-25 MG tablet TAKE 1 TABLET BY MOUTH  DAILY  . timolol (TIMOPTIC) 0.25 % ophthalmic solution   . Turmeric 500 MG CAPS Take 1 capsule by mouth daily.  . vitamin C (ASCORBIC ACID) 500 MG tablet Take 500 mg by mouth daily.  . [DISCONTINUED] COMBIGAN 0.2-0.5 % ophthalmic solution  (Patient not taking: Reported on 04/29/2020)  . [DISCONTINUED] latanoprost (XALATAN) 0.005 % ophthalmic solution Place 1 drop into both eyes at bedtime. As directed  (Patient not taking: Reported on 04/29/2020)  . [DISCONTINUED] pravastatin (PRAVACHOL) 40 MG tablet TAKE 2 TABLETS BY MOUTH  DAILY (Patient not taking: Reported on 04/29/2020)   No facility-administered encounter medications on file as of 04/29/2020.    Review of Systems  Constitutional: Negative for appetite change and unexpected weight change.  HENT: Negative for congestion and sinus pressure.   Eyes: Negative for pain and visual disturbance.  Respiratory: Negative for cough, chest tightness and shortness of  breath.   Cardiovascular: Negative for chest pain, palpitations and leg swelling.  Gastrointestinal: Negative for abdominal pain, diarrhea, nausea and vomiting.  Genitourinary: Negative for difficulty urinating and dysuria.  Musculoskeletal: Negative for joint swelling and myalgias.  Skin: Negative for color change and rash.  Neurological: Negative for dizziness, light-headedness and headaches.  Hematological: Negative for adenopathy. Does not bruise/bleed easily.  Psychiatric/Behavioral: Negative for agitation and dysphoric mood.       Increased stress as outlined.         Objective:    Physical Exam Vitals reviewed.  Constitutional:      General: She is not in acute distress.    Appearance: Normal appearance. She is well-developed.  HENT:  Head: Normocephalic and atraumatic.     Right Ear: External ear normal.     Left Ear: External ear normal.  Eyes:     General: No scleral icterus.       Right eye: No discharge.        Left eye: No discharge.     Conjunctiva/sclera: Conjunctivae normal.  Neck:     Thyroid: No thyromegaly.  Cardiovascular:     Rate and Rhythm: Normal rate and regular rhythm.  Pulmonary:     Effort: No tachypnea, accessory muscle usage or respiratory distress.     Breath sounds: Normal breath sounds. No decreased breath sounds or wheezing.  Chest:     Breasts:        Right: No inverted nipple, mass, nipple discharge or tenderness (no axillary adenopathy).        Left: No inverted nipple, mass, nipple discharge or tenderness (no axilarry adenopathy).  Abdominal:     General: Bowel sounds are normal.     Palpations: Abdomen is soft.     Tenderness: There is no abdominal tenderness.  Musculoskeletal:        General: No swelling or tenderness.     Cervical back: Neck supple. No tenderness.  Lymphadenopathy:     Cervical: No cervical adenopathy.  Skin:    Findings: No erythema or rash.  Neurological:     Mental Status: She is alert and oriented to  person, place, and time.  Psychiatric:        Mood and Affect: Mood normal.        Behavior: Behavior normal.     BP 138/68   Pulse (!) 50   Temp (!) 97.5 F (36.4 C)   Ht 5' (1.524 m)   Wt 201 lb 12.8 oz (91.5 kg)   LMP 08/11/1995   SpO2 98%   BMI 39.41 kg/m  Wt Readings from Last 3 Encounters:  04/29/20 201 lb 12.8 oz (91.5 kg)  12/23/19 200 lb (90.7 kg)  09/24/19 210 lb (95.3 kg)     Lab Results  Component Value Date   WBC 6.7 05/01/2020   HGB 15.0 05/01/2020   HCT 43.4 05/01/2020   PLT 267 05/01/2020   GLUCOSE 125 (H) 05/01/2020   CHOL 155 05/01/2020   TRIG 143 05/01/2020   HDL 48 05/01/2020   LDLCALC 82 05/01/2020   ALT 14 05/01/2020   AST 15 05/01/2020   NA 139 05/01/2020   K 4.9 05/01/2020   CL 96 05/01/2020   CREATININE 1.21 (H) 05/01/2020   BUN 32 (H) 05/01/2020   CO2 30 (H) 05/01/2020   TSH 1.040 12/25/2019   INR 1.00 07/24/2018   HGBA1C 6.9 (H) 05/01/2020    MM 3D SCREEN BREAST BILATERAL  Result Date: 04/14/2020 CLINICAL DATA:  Screening. EXAM: DIGITAL SCREENING BILATERAL MAMMOGRAM WITH TOMO AND CAD COMPARISON:  Previous exam(s). ACR Breast Density Category a: The breast tissue is almost entirely fatty. FINDINGS: There are no findings suspicious for malignancy. Images were processed with CAD. IMPRESSION: No mammographic evidence of malignancy. A result letter of this screening mammogram will be mailed directly to the patient. RECOMMENDATION: Screening mammogram in one year. (Code:SM-B-01Y) BI-RADS CATEGORY  1: Negative. Electronically Signed   By: Ammie Ferrier M.D.   On: 04/14/2020 15:19       Assessment & Plan:   Problem List Items Addressed This Visit    Vitamin D deficiency    Follow vitamin D level.       Relevant  Orders   VITAMIN D 25 Hydroxy (Vit-D Deficiency, Fractures) (Completed)   Type 2 diabetes mellitus with hyperglycemia (H[facility])    Discussed low carb diet and exercise.  Follow met b and a1c.       Stress    Increased  stress as outlined.  Discussed with her today.  Desires no further intervention at this time. Follow.        Hypothyroidism    On thyroid replacement.  Follow tsh.       Hypertension    Blood pressure doing better overall.  Continue amlodipine and quinapril as well as spironolactone/hctz.  Follow pressures.  Follow metabolic panel.       Relevant Orders   CBC with Differential/Platelet (Completed)   Hypercholesterolemia    On pravastatin.  Low cholesterol diet and exercise.  Follow lipid panel and liver function tests.        Relevant Orders   Hepatic function panel (Completed)   Lipid panel (Completed)   Healthcare maintenance    Physical today 04/29/20.  Mammogram 04/14/20 - birads I.  colonosocpy 06/2013.  Hemoccult cards given.       CKD (chronic kidney disease), stage III    Avoid antiinflammatories.  Continue quinapril.  Follow metabolic panel.        Other Visit Diagnoses    Colon cancer screening    -  Primary   Relevant Orders   Fecal occult blood, imunochemical       Einar Pheasant, MD

## 2020-05-02 LAB — LIPID PANEL
Chol/HDL Ratio: 3.2 ratio (ref 0.0–4.4)
Cholesterol, Total: 155 mg/dL (ref 100–199)
HDL: 48 mg/dL
LDL Chol Calc (NIH): 82 mg/dL (ref 0–99)
Triglycerides: 143 mg/dL (ref 0–149)
VLDL Cholesterol Cal: 25 mg/dL (ref 5–40)

## 2020-05-02 LAB — CBC WITH DIFFERENTIAL/PLATELET
Basophils Absolute: 0.1 10*3/uL (ref 0.0–0.2)
Basos: 1 %
EOS (ABSOLUTE): 0.2 10*3/uL (ref 0.0–0.4)
Eos: 3 %
Hematocrit: 43.4 % (ref 34.0–46.6)
Hemoglobin: 15 g/dL (ref 11.1–15.9)
Immature Grans (Abs): 0 10*3/uL (ref 0.0–0.1)
Immature Granulocytes: 0 %
Lymphocytes Absolute: 1.7 10*3/uL (ref 0.7–3.1)
Lymphs: 26 %
MCH: 30.4 pg (ref 26.6–33.0)
MCHC: 34.6 g/dL (ref 31.5–35.7)
MCV: 88 fL (ref 79–97)
Monocytes Absolute: 0.7 10*3/uL (ref 0.1–0.9)
Monocytes: 10 %
Neutrophils Absolute: 4 10*3/uL (ref 1.4–7.0)
Neutrophils: 60 %
Platelets: 267 10*3/uL (ref 150–450)
RBC: 4.94 x10E6/uL (ref 3.77–5.28)
RDW: 12.1 % (ref 11.7–15.4)
WBC: 6.7 10*3/uL (ref 3.4–10.8)

## 2020-05-02 LAB — BASIC METABOLIC PANEL
BUN/Creatinine Ratio: 26 (ref 12–28)
BUN: 32 mg/dL — ABNORMAL HIGH (ref 8–27)
CO2: 30 mmol/L — ABNORMAL HIGH (ref 20–29)
Calcium: 11.4 mg/dL — ABNORMAL HIGH (ref 8.7–10.3)
Chloride: 96 mmol/L (ref 96–106)
Creatinine, Ser: 1.21 mg/dL — ABNORMAL HIGH (ref 0.57–1.00)
GFR calc Af Amer: 49 mL/min/{1.73_m2} — ABNORMAL LOW (ref >59)
GFR calc non Af Amer: 43 mL/min/{1.73_m2} — ABNORMAL LOW (ref >59)
Glucose: 125 mg/dL — ABNORMAL HIGH (ref 65–99)
Potassium: 4.9 mmol/L (ref 3.5–5.2)
Sodium: 139 mmol/L (ref 134–144)

## 2020-05-02 LAB — HEMOGLOBIN A1C
Est. average glucose Bld gHb Est-mCnc: 151 mg/dL
Hgb A1c MFr Bld: 6.9 % — ABNORMAL HIGH (ref 4.8–5.6)

## 2020-05-02 LAB — HEPATIC FUNCTION PANEL
ALT: 14 IU/L (ref 0–32)
AST: 15 IU/L (ref 0–40)
Albumin: 4.9 g/dL — ABNORMAL HIGH (ref 3.7–4.7)
Alkaline Phosphatase: 55 IU/L (ref 48–121)
Bilirubin Total: 0.7 mg/dL (ref 0.0–1.2)
Bilirubin, Direct: 0.17 mg/dL (ref 0.00–0.40)
Total Protein: 7.4 g/dL (ref 6.0–8.5)

## 2020-05-02 LAB — MICROALBUMIN / CREATININE URINE RATIO
Creatinine, Urine: 33.5 mg/dL
Microalb/Creat Ratio: <9 mg/g{creat} (ref 0–29)
Microalbumin, Urine: <3 ug/mL

## 2020-05-02 LAB — VITAMIN D 25 HYDROXY (VIT D DEFICIENCY, FRACTURES): Vit D, 25-Hydroxy: 33.3 ng/mL (ref 30.0–100.0)

## 2020-05-04 ENCOUNTER — Encounter: Payer: Self-pay | Admitting: Internal Medicine

## 2020-05-04 NOTE — Assessment & Plan Note (Signed)
On thyroid replacement.  Follow tsh.  

## 2020-05-04 NOTE — Assessment & Plan Note (Signed)
On pravastatin.  Low cholesterol diet and exercise.  Follow lipid panel and liver function tests.   

## 2020-05-04 NOTE — Assessment & Plan Note (Signed)
Blood pressure doing better overall.  Continue amlodipine and quinapril as well as spironolactone/hctz.  Follow pressures.  Follow metabolic panel.

## 2020-05-04 NOTE — Assessment & Plan Note (Signed)
Discussed low carb diet and exercise.  Follow met b and a1c.  

## 2020-05-04 NOTE — Assessment & Plan Note (Signed)
Follow vitamin D level.  

## 2020-05-04 NOTE — Assessment & Plan Note (Signed)
Increased stress as outlined.  Discussed with her today.  Desires no further intervention at this time.  Follow.   

## 2020-05-04 NOTE — Assessment & Plan Note (Signed)
Avoid antiinflammatories.  Continue quinapril.  Follow metabolic panel.

## 2020-05-05 ENCOUNTER — Telehealth: Payer: Self-pay | Admitting: Internal Medicine

## 2020-05-05 NOTE — Telephone Encounter (Signed)
Orders placed for f/u labs.  

## 2020-05-06 ENCOUNTER — Telehealth: Payer: Self-pay | Admitting: Internal Medicine

## 2020-05-06 NOTE — Telephone Encounter (Signed)
Pt thinks that she has gout and wants something called in  Only wants to talk to [place]

## 2020-05-06 NOTE — Telephone Encounter (Signed)
Right foot, big toe. Swelling localized in the foot. Stops at the ankle.  Redness localized around the big toe. Foot is not warm to the touch but is tender and painful. Pain is better today vs. Yesterday.  Started Monday morning. Confirmed no other acute symptoms.  She has no history of gout. Advised that you are out of the office and that she should go to the urgent care to confirm something more acute is not going on. Patient declined and requested that I send message over to you and that she is ok to wait until tomorrow.  Patient agreed to be evaluated at acute care if symptoms worsen.

## 2020-05-07 NOTE — Telephone Encounter (Signed)
Pt scheduled at 4 tomorrow

## 2020-05-07 NOTE — Telephone Encounter (Signed)
Agree with need for evaluation.  May be gout flare, but no history of gout.  Need to confirm diagnosis.  Any opening to work in?

## 2020-05-08 ENCOUNTER — Other Ambulatory Visit: Payer: Self-pay

## 2020-05-08 ENCOUNTER — Ambulatory Visit: Payer: 59 | Admitting: Internal Medicine

## 2020-05-08 DIAGNOSIS — M109 Gout, unspecified: Secondary | ICD-10-CM

## 2020-05-08 DIAGNOSIS — I10 Essential (primary) hypertension: Secondary | ICD-10-CM | POA: Diagnosis not present

## 2020-05-08 MED ORDER — COLCHICINE 0.6 MG PO TABS: 0.6000 mg | ORAL_TABLET | Freq: Two times a day (BID) | ORAL | 0 refills | Status: DC | PRN

## 2020-05-08 NOTE — Progress Notes (Signed)
Patient ID: Lorraine Foley, female   DOB: 1940-12-22, 79 y.o.   MRN: [id number]   Subjective:    Patient ID: Lorraine Foley, female    DOB: 1941/07/26, 79 y.o.   MRN: [id number]  HPI This visit occurred during the SARS-CoV-2 public health emergency.  Safety protocols were in place, including screening questions prior to the visit, additional usage of staff PPE, and extensive cleaning of exam room while observing appropriate contact time as indicated for disinfecting solutions.  Patient here as a work in appt with concerns regarding increased pain - right great toe and base of toe.  Increased redness and swelling.  States started noticing pain right foot/toe five days ago.  The following day, noticed increased redness.  Noticed more swelling yesterday.  Swelling in foot and ankle.  Has been icing and elevated last night.  Some decreased swelling today.  Pain with walking.  No injury or trauma.  No fever.  Otherwise doing well.    Past Medical History:  Diagnosis Date  . Arthritis    knees  . Diabetes mellitus (HCC)   . GERD (gastroesophageal reflux disease)   . Glaucoma   . Hypercholesterolemia   . Hypertension   . Hypothyroidism   . Pneumonia    Past Surgical History:  Procedure Laterality Date  . CATARACT EXTRACTION W/PHACO Left 11/09/2016   Procedure: CATARACT EXTRACTION PHACO AND INTRAOCULAR LENS PLACEMENT (IOC)  left diabetic complicated;  Surgeon: Lockie Mola, MD;  Location: [facility];  Service: Ophthalmology;  Laterality: Left;  Diabetic - oral meds  . CATARACT EXTRACTION W/PHACO Right 03/08/2017   Procedure: CATARACT EXTRACTION PHACO AND INTRAOCULAR LENS PLACEMENT (IOC)  Right Diabetic complicated;  Surgeon: Lockie Mola, MD;  Location: [facility];  Service: Ophthalmology;  Laterality: Right;  . EYE SURGERY Bilateral 08/2014, 2.2016   laser surgery in preparation for glaucoma surgery  . KNEE ARTHROPLASTY Left 08/06/2018   Procedure: COMPUTER ASSISTED  TOTAL KNEE ARTHROPLASTY;  Surgeon: Donato Heinz, MD;  Location: [facility];  Service: Orthopedics;  Laterality: Left;  . TOTAL HIP ARTHROPLASTY  5/13   right   Family History  Problem Relation Age of Onset  . Mental illness Father        suicide  . Liver disease Father        alcohol  . Glaucoma Father   . Alcohol abuse Father   . Blindness Father   . Glaucoma Mother   . Osteoporosis Mother   . Diabetes Sister   . Breast cancer Sister 70  . Colon cancer Neg Hx    Social History   Socioeconomic History  . Marital status: Divorced    Spouse name: Not on file  . Number of children: 1  . Years of education: Not on file  . Highest education level: Not on file  Occupational History  . Not on file  Tobacco Use  . Smoking status: Never Smoker  . Smokeless tobacco: Never Used  Vaping Use  . Vaping Use: Never used  Substance and Sexual Activity  . Alcohol use: No    Alcohol/week: 0.0 standard drinks  . Drug use: No  . Sexual activity: Not on file  Other Topics Concern  . Not on file  Social History Narrative  . Not on file   Social Determinants of Health   Financial Resource Strain:   . Difficulty of Paying Living Expenses: Not on file  Food Insecurity:   . Worried About Programme researcher, broadcasting/film/video in  the Last Year: Not on file  . Ran Out of Food in the Last Year: Not on file  Transportation Needs:   . Lack of Transportation (Medical): Not on file  . Lack of Transportation (Non-Medical): Not on file  Physical Activity:   . Days of Exercise per Week: Not on file  . Minutes of Exercise per Session: Not on file  Stress:   . Feeling of Stress : Not on file  Social Connections:   . Frequency of Communication with Friends and Family: Not on file  . Frequency of Social Gatherings with Friends and Family: Not on file  . Attends Religious Services: Not on file  . Active Member of Clubs or Organizations: Not on file  . Attends Banker Meetings: Not on file  .  Marital Status: Not on file    Outpatient Encounter Medications as of 05/08/2020  Medication Sig  . acetaminophen (TYLENOL) 650 MG CR tablet Take 650 mg by mouth daily as needed for pain.  Marland Kitchen amLODipine (NORVASC) 5 MG tablet TAKE 1 TABLET BY MOUTH  TWICE DAILY  . Cholecalciferol (VITAMIN D-3) 1000 UNITS CAPS Take 1 capsule by mouth daily.  . cloNIDine (CATAPRES) 0.1 MG tablet TAKE 1 TABLET BY MOUTH 3  TIMES DAILY  . glucose blood test strip Use as instructed to check blood sugars once daily. Dx E11.9  . Lancets (ONETOUCH ULTRASOFT) lancets Use as instructed to check blood sugars once daily. Dx E11.9  . levothyroxine (SYNTHROID) 100 MCG tablet TAKE 1 TABLET BY MOUTH  DAILY  . quinapril (ACCUPRIL) 40 MG tablet TAKE 1 TABLET BY MOUTH  TWICE DAILY  . ROCKLATAN 0.02-0.005 % SOLN Apply to eye.  . rosuvastatin (CRESTOR) 20 MG tablet TAKE 1 TABLET(20 MG) BY MOUTH DAILY  . spironolactone-hydrochlorothiazide (ALDACTAZIDE) 25-25 MG tablet TAKE 1 TABLET BY MOUTH  DAILY  . timolol (TIMOPTIC) 0.25 % ophthalmic solution   . Turmeric 500 MG CAPS Take 1 capsule by mouth daily.  . vitamin C (ASCORBIC ACID) 500 MG tablet Take 500 mg by mouth daily.  . colchicine 0.6 MG tablet Take 1 tablet (0.6 mg total) by mouth 2 (two) times daily as needed.   No facility-administered encounter medications on file as of 05/08/2020.    Review of Systems  Constitutional: Negative for appetite change and fever.  HENT: Negative for congestion and sinus pressure.   Respiratory: Negative for cough, chest tightness and shortness of breath.   Cardiovascular: Negative for chest pain and palpitations.  Gastrointestinal: Negative for abdominal pain, diarrhea, nausea and vomiting.  Musculoskeletal: Negative for myalgias.       Increased pain and swelling - right great toe and base of toe and right foot.    Skin: Negative for color change and rash.       Increased erythema toe and base of great toe.    Neurological: Negative for  dizziness, light-headedness and headaches.  Psychiatric/Behavioral: Negative for agitation and dysphoric mood.       Objective:    Physical Exam Vitals reviewed.  Constitutional:      General: She is not in acute distress.    Appearance: Normal appearance.  HENT:     Head: Normocephalic and atraumatic.     Right Ear: External ear normal.     Left Ear: External ear normal.  Eyes:     General: No scleral icterus.       Right eye: No discharge.        Left eye: No discharge.  Conjunctiva/sclera: Conjunctivae normal.  Neck:     Thyroid: No thyromegaly.  Cardiovascular:     Rate and Rhythm: Normal rate and regular rhythm.  Pulmonary:     Effort: No respiratory distress.     Breath sounds: Normal breath sounds. No wheezing.  Abdominal:     General: Bowel sounds are normal.     Palpations: Abdomen is soft.     Tenderness: There is no abdominal tenderness.  Musculoskeletal:        General: No tenderness.     Cervical back: Neck supple. No tenderness.     Comments: Increased swelling and pain - great toe and foot.  Increased erythema and warmth.    Lymphadenopathy:     Cervical: No cervical adenopathy.  Skin:    Findings: Erythema present. No rash.  Neurological:     Mental Status: She is alert.  Psychiatric:        Mood and Affect: Mood normal.        Behavior: Behavior normal.     BP (!) 148/68   Pulse 69   Temp 98.2 F (36.8 C)   Resp 16   Wt 202 lb (91.6 kg)   LMP 08/11/1995   SpO2 98%   BMI 39.45 kg/m  Wt Readings from Last 3 Encounters:  05/08/20 202 lb (91.6 kg)  04/29/20 201 lb 12.8 oz (91.5 kg)  12/23/19 200 lb (90.7 kg)     Lab Results  Component Value Date   WBC 6.7 05/01/2020   HGB 15.0 05/01/2020   HCT 43.4 05/01/2020   PLT 267 05/01/2020   GLUCOSE 125 (H) 05/01/2020   CHOL 155 05/01/2020   TRIG 143 05/01/2020   HDL 48 05/01/2020   LDLCALC 82 05/01/2020   ALT 14 05/01/2020   AST 15 05/01/2020   NA 139 05/01/2020   K 4.9 05/01/2020     CL 96 05/01/2020   CREATININE 1.21 (H) 05/01/2020   BUN 32 (H) 05/01/2020   CO2 30 (H) 05/01/2020   TSH 1.040 12/25/2019   INR 1.00 07/24/2018   HGBA1C 6.9 (H) 05/01/2020    MM 3D SCREEN BREAST BILATERAL  Result Date: 04/14/2020 CLINICAL DATA:  Screening. EXAM: DIGITAL SCREENING BILATERAL MAMMOGRAM WITH TOMO AND CAD COMPARISON:  Previous exam(s). ACR Breast Density Category a: The breast tissue is almost entirely fatty. FINDINGS: There are no findings suspicious for malignancy. Images were processed with CAD. IMPRESSION: No mammographic evidence of malignancy. A result letter of this screening mammogram will be mailed directly to the patient. RECOMMENDATION: Screening mammogram in one year. (Code:SM-B-01Y) BI-RADS CATEGORY  1: Negative. Electronically Signed   By: Frederico Hamman M.D.   On: 04/14/2020 15:19       Assessment & Plan:   Problem List Items Addressed This Visit    Hypertension    Blood pressure on recheck improved.  Continue amlodipine, quinapril and spironolactone/hctz.  Follow pressures.  Follow metabolic panel.       Gout    Increased toe pain and swelling.  Appears to be c/w gout.  Unable to take increased antiinflammatories. Treat with colchicine.  She is on spironolactone/hctz.  Has been on this medication for years.  This is her first gout flare.  Has had hard to control pressure.  Will hold on making changes in her medication.  She is in agreement. Discussed diet.  Follow.  Treat with colchicine as directed.  Follow.  Call with update.           Deandrea Vanpelt  Nicki Reaper, MD

## 2020-05-09 ENCOUNTER — Encounter: Payer: Self-pay | Admitting: Internal Medicine

## 2020-05-09 DIAGNOSIS — M109 Gout, unspecified: Secondary | ICD-10-CM | POA: Insufficient documentation

## 2020-05-09 NOTE — Assessment & Plan Note (Signed)
Increased toe pain and swelling.  Appears to be c/w gout.  Unable to take increased antiinflammatories. Treat with colchicine.  She is on spironolactone/hctz.  Has been on this medication for years.  This is her first gout flare.  Has had hard to control pressure.  Will hold on making changes in her medication.  She is in agreement. Discussed diet.  Follow.  Treat with colchicine as directed.  Follow.  Call with update.

## 2020-05-09 NOTE — Assessment & Plan Note (Signed)
Blood pressure on recheck improved.  Continue amlodipine, quinapril and spironolactone/hctz.  Follow pressures.  Follow metabolic panel.

## 2020-05-13 NOTE — Telephone Encounter (Signed)
Yes, she can get the flu vaccine with colchicine.  Also, if her foot is better, she does not have to take the entire 30 day rx.

## 2020-05-13 NOTE — Telephone Encounter (Signed)
Pt called to report that her gout is better and wanted to know if she could get the Flu shot with the me diction that she started

## 2020-05-14 NOTE — Telephone Encounter (Signed)
Patient verbalized understanding and had no further questions.  

## 2020-05-20 LAB — PTH, INTACT AND CALCIUM
Calcium: 9.8 mg/dL (ref 8.7–10.3)
PTH: 26 pg/mL (ref 15–65)

## 2020-05-20 LAB — CALCIUM, IONIZED: Calcium, Ion: 5.2 mg/dL (ref 4.5–5.6)

## 2020-05-25 ENCOUNTER — Other Ambulatory Visit: Payer: 59

## 2020-05-26 ENCOUNTER — Other Ambulatory Visit (INDEPENDENT_AMBULATORY_CARE_PROVIDER_SITE_OTHER): Payer: 59

## 2020-05-26 DIAGNOSIS — Z1211 Encounter for screening for malignant neoplasm of colon: Secondary | ICD-10-CM

## 2020-05-26 LAB — FECAL OCCULT BLOOD, IMMUNOCHEMICAL: Fecal Occult Bld: NEGATIVE

## 2020-06-11 ENCOUNTER — Other Ambulatory Visit: Payer: Self-pay | Admitting: Internal Medicine

## 2020-07-31 LAB — HM DIABETES EYE EXAM

## 2020-08-11 ENCOUNTER — Telehealth: Payer: Self-pay | Admitting: *Deleted

## 2020-08-11 ENCOUNTER — Encounter: Payer: Self-pay | Admitting: Internal Medicine

## 2020-08-11 NOTE — Telephone Encounter (Signed)
openend in error

## 2020-08-22 ENCOUNTER — Other Ambulatory Visit: Payer: Self-pay | Admitting: Internal Medicine

## 2020-08-31 ENCOUNTER — Telehealth (INDEPENDENT_AMBULATORY_CARE_PROVIDER_SITE_OTHER): Payer: 59 | Admitting: Internal Medicine

## 2020-08-31 ENCOUNTER — Encounter: Payer: Self-pay | Admitting: Internal Medicine

## 2020-08-31 VITALS — BP 138/80 | Ht 60.0 in | Wt 200.0 lb

## 2020-08-31 DIAGNOSIS — E1165 Type 2 diabetes mellitus with hyperglycemia: Secondary | ICD-10-CM | POA: Diagnosis not present

## 2020-08-31 DIAGNOSIS — I10 Essential (primary) hypertension: Secondary | ICD-10-CM | POA: Diagnosis not present

## 2020-08-31 DIAGNOSIS — E039 Hypothyroidism, unspecified: Secondary | ICD-10-CM

## 2020-08-31 DIAGNOSIS — N183 Chronic kidney disease, stage 3 unspecified: Secondary | ICD-10-CM

## 2020-08-31 DIAGNOSIS — E7800 Pure hypercholesterolemia, unspecified: Secondary | ICD-10-CM

## 2020-08-31 DIAGNOSIS — Z96659 Presence of unspecified artificial knee joint: Secondary | ICD-10-CM

## 2020-08-31 DIAGNOSIS — M109 Gout, unspecified: Secondary | ICD-10-CM

## 2020-08-31 NOTE — Progress Notes (Signed)
Patient ID: Lorraine Foley, female   DOB: 1940/10/03, 80 y.o.   MRN: [id number]   Virtual Visit via telephone Note  This visit type was conducted due to national recommendations for restrictions regarding the COVID-19 pandemic (e.g. social distancing).  This format is felt to be most appropriate for this patient at this time.  All issues noted in this document were discussed and addressed.  No physical exam was performed (except for noted visual exam findings with Video Visits).   I connected with [name] by telephone and verified that I am speaking with the correct person using two identifiers. Location patient: home Location provider: work  Persons participating in the telephone visit: patient, provider  The limitations, risks, security and privacy concerns of performing an evaluation and management service by telephone and the availability of in person appointments have been discussed. . It has also been discussed with the patient that there may be a patient responsible charge related to this service. The patient expressed understanding and agreed to proceed.   Reason for visit: follow up  HPI: Follow up scheduled - f/u regarding her blood pressure, blood sugar and cholesterol.  She reports she is doing relatively well.  Tries to stay active.  No chest pain or sob reported.  No abdominal pain.  Bowels moving.  No headache or dizziness.  S/p laser surgery - last Monday.  Sees Dr Wallace Going.  Has f/u planned end of 08/2020.  No further gout flares.  Blood pressures on outside checks averaging:  130s/80.     ROS: See pertinent positives and negatives per HPI.  Past Medical History:  Diagnosis Date  . Arthritis    knees  . Diabetes mellitus ([place])   . GERD (gastroesophageal reflux disease)   . Glaucoma   . Hypercholesterolemia   . Hypertension   . Hypothyroidism   . Pneumonia     Past Surgical History:  Procedure Laterality Date  . CATARACT EXTRACTION W/PHACO Left 11/09/2016    Procedure: CATARACT EXTRACTION PHACO AND INTRAOCULAR LENS PLACEMENT ([place])  left diabetic complicated;  Surgeon: Leandrew Koyanagi, MD;  Location: [place];  Service: Ophthalmology;  Laterality: Left;  Diabetic - oral meds  . CATARACT EXTRACTION W/PHACO Right 03/08/2017   Procedure: CATARACT EXTRACTION PHACO AND INTRAOCULAR LENS PLACEMENT ([place])  Right Diabetic complicated;  Surgeon: Leandrew Koyanagi, MD;  Location: [place];  Service: Ophthalmology;  Laterality: Right;  . EYE SURGERY Bilateral 08/2014, 2.2016   laser surgery in preparation for glaucoma surgery  . KNEE ARTHROPLASTY Left 08/06/2018   Procedure: COMPUTER ASSISTED TOTAL KNEE ARTHROPLASTY;  Surgeon: Dereck Leep, MD;  Location: [facility];  Service: Orthopedics;  Laterality: Left;  . TOTAL HIP ARTHROPLASTY  5/13   right    Family History  Problem Relation Age of Onset  . Mental illness Father        suicide  . Liver disease Father        alcohol  . Glaucoma Father   . Alcohol abuse Father   . Blindness Father   . Glaucoma Mother   . Osteoporosis Mother   . Diabetes Sister   . Breast cancer Sister 28  . Colon cancer Neg Hx     SOCIAL HX: reviewed.    Current Outpatient Medications:  .  acetaminophen (TYLENOL) 650 MG CR tablet, Take 650 mg by mouth daily as needed for pain., Disp: , Rfl:  .  amLODipine (NORVASC) 5 MG tablet, TAKE 1 TABLET BY MOUTH  TWICE DAILY,  Disp: 180 tablet, Rfl: 3 .  Cholecalciferol (VITAMIN D-3) 1000 UNITS CAPS, Take 1 capsule by mouth daily., Disp: , Rfl:  .  cloNIDine (CATAPRES) 0.1 MG tablet, TAKE 1 TABLET BY MOUTH 3  TIMES DAILY, Disp: 270 tablet, Rfl: 3 .  colchicine 0.6 MG tablet, Take 1 tablet (0.6 mg total) by mouth 2 (two) times daily as needed., Disp: 60 tablet, Rfl: 0 .  glucose blood test strip, Use as instructed to check blood sugars once daily. Dx E11.9, Disp: 100 each, Rfl: 12 .  Lancets (ONETOUCH ULTRASOFT) lancets, Use as instructed to check blood sugars  once daily. Dx E11.9, Disp: 100 each, Rfl: 12 .  levothyroxine (SYNTHROID) 100 MCG tablet, TAKE 1 TABLET BY MOUTH  DAILY, Disp: 90 tablet, Rfl: 3 .  quinapril (ACCUPRIL) 40 MG tablet, TAKE 1 TABLET BY MOUTH  TWICE DAILY, Disp: 180 tablet, Rfl: 3 .  ROCKLATAN 0.02-0.005 % SOLN, Apply to eye., Disp: , Rfl:  .  rosuvastatin (CRESTOR) 20 MG tablet, TAKE 1 TABLET(20 MG) BY MOUTH DAILY, Disp: 90 tablet, Rfl: 1 .  spironolactone-hydrochlorothiazide (ALDACTAZIDE) 25-25 MG tablet, TAKE 1 TABLET BY MOUTH  DAILY, Disp: 90 tablet, Rfl: 3 .  timolol (TIMOPTIC) 0.25 % ophthalmic solution, , Disp: , Rfl:  .  Turmeric 500 MG CAPS, Take 1 capsule by mouth daily., Disp: , Rfl:  .  vitamin C (ASCORBIC ACID) 500 MG tablet, Take 500 mg by mouth daily., Disp: , Rfl:   EXAM:  VITALS per patient if applicable: 416/38  GENERAL: alert. Sounds to be in no acute distress.  Answering questions appropriately.    PSYCH/NEURO: pleasant and cooperative, no obvious depression or anxiety, speech and thought processing grossly intact  ASSESSMENT AND PLAN:  Discussed the following assessment and plan:  Problem List Items Addressed This Visit    Hypertension - Primary    Continues on amlodipine, quinapril and spironolactone/hctz.  Blood pressures as outlined.  Follow pressures.  Check metabolic panel - ordered.       Relevant Orders   Basic metabolic panel   Hypercholesterolemia    On pravastatin.  Low cholesterol diet and exercise.   Lab Results  Component Value Date   CHOL 155 05/01/2020   HDL 48 05/01/2020   LDLCALC 82 05/01/2020   TRIG 143 05/01/2020   CHOLHDL 3.2 05/01/2020        Relevant Orders   Hepatic function panel   Lipid panel   Hypothyroidism    On thyroid replacement.  Check tsh.       Relevant Orders   TSH   Type 2 diabetes mellitus with hyperglycemia (HCC)    Low carb diet and exercise.  Follow sugars.  Check met b and a1c.   Lab Results  Component Value Date   HGBA1C 6.9 (H)  05/01/2020        Relevant Orders   Hemoglobin A1c   Total knee replacement status    Just saw Dr Marry Guan s/p knee surgery.  Stable.       CKD (chronic kidney disease), stage III ([place])    Continue to avoid antiinflammatories.  Continue ace inhibitor.       Gout    No further flares.  Follow.           I discussed the assessment and treatment plan with the patient. The patient was provided an opportunity to ask questions and all were answered. The patient agreed with the plan and demonstrated an understanding of the instructions.  The patient was advised to call back or seek an in-person evaluation if the symptoms worsen or if the condition fails to improve as anticipated.  I provided 23 minutes of non-face-to-face time during this encounter.   Kaedin Hicklin, MD  

## 2020-09-01 ENCOUNTER — Encounter: Payer: Self-pay | Admitting: Internal Medicine

## 2020-09-01 NOTE — Assessment & Plan Note (Signed)
On pravastatin.  Low cholesterol diet and exercise.   Lab Results  Component Value Date   CHOL 155 05/01/2020   HDL 48 05/01/2020   LDLCALC 82 05/01/2020   TRIG 143 05/01/2020   CHOLHDL 3.2 05/01/2020

## 2020-09-01 NOTE — Assessment & Plan Note (Signed)
On thyroid replacement.  Check tsh.  

## 2020-09-01 NOTE — Assessment & Plan Note (Signed)
Low carb diet and exercise.  Follow sugars.  Check met b and a1c.   Lab Results  Component Value Date   HGBA1C 6.9 (H) 05/01/2020

## 2020-09-01 NOTE — Assessment & Plan Note (Signed)
Continue to avoid antiinflammatories.  Continue ace inhibitor.

## 2020-09-01 NOTE — Assessment & Plan Note (Signed)
Continues on amlodipine, quinapril and spironolactone/hctz.  Blood pressures as outlined.  Follow pressures.  Check metabolic panel - ordered.

## 2020-09-01 NOTE — Assessment & Plan Note (Signed)
No further flares.  Follow.

## 2020-09-01 NOTE — Assessment & Plan Note (Signed)
Just saw Dr Ernest Pine s/p knee surgery.  Stable.

## 2020-09-03 LAB — BASIC METABOLIC PANEL
BUN/Creatinine Ratio: 18 (ref 12–28)
BUN: 21 mg/dL (ref 8–27)
CO2: 27 mmol/L (ref 20–29)
Calcium: 10.1 mg/dL (ref 8.7–10.3)
Chloride: 100 mmol/L (ref 96–106)
Creatinine, Ser: 1.2 mg/dL — ABNORMAL HIGH (ref 0.57–1.00)
GFR calc Af Amer: 50 mL/min/{1.73_m2} — ABNORMAL LOW (ref >59)
GFR calc non Af Amer: 43 mL/min/{1.73_m2} — ABNORMAL LOW (ref >59)
Glucose: 147 mg/dL — ABNORMAL HIGH (ref 65–99)
Potassium: 5.4 mmol/L — ABNORMAL HIGH (ref 3.5–5.2)
Sodium: 143 mmol/L (ref 134–144)

## 2020-09-03 LAB — HEPATIC FUNCTION PANEL
ALT: 17 IU/L (ref 0–32)
AST: 15 IU/L (ref 0–40)
Albumin: 4.5 g/dL (ref 3.7–4.7)
Alkaline Phosphatase: 57 IU/L (ref 44–121)
Bilirubin Total: 0.7 mg/dL (ref 0.0–1.2)
Bilirubin, Direct: 0.17 mg/dL (ref 0.00–0.40)
Total Protein: 7.2 g/dL (ref 6.0–8.5)

## 2020-09-03 LAB — LIPID PANEL
Chol/HDL Ratio: 2.9 ratio (ref 0.0–4.4)
Cholesterol, Total: 145 mg/dL (ref 100–199)
HDL: 50 mg/dL (ref >39)
LDL Chol Calc (NIH): 69 mg/dL (ref 0–99)
Triglycerides: 151 mg/dL — ABNORMAL HIGH (ref 0–149)
VLDL Cholesterol Cal: 26 mg/dL (ref 5–40)

## 2020-09-03 LAB — TSH: TSH: 0.885 u[IU]/mL (ref 0.450–4.500)

## 2020-09-03 LAB — HEMOGLOBIN A1C
Est. average glucose Bld gHb Est-mCnc: 183 mg/dL
Hgb A1c MFr Bld: 8 % — ABNORMAL HIGH (ref 4.8–5.6)

## 2020-09-04 ENCOUNTER — Telehealth: Payer: Self-pay | Admitting: Internal Medicine

## 2020-09-04 DIAGNOSIS — E875 Hyperkalemia: Secondary | ICD-10-CM

## 2020-09-04 NOTE — Telephone Encounter (Signed)
Order placed for f/u potassium check (stat)

## 2020-09-05 ENCOUNTER — Other Ambulatory Visit: Payer: Self-pay | Admitting: Internal Medicine

## 2020-09-05 DIAGNOSIS — E1165 Type 2 diabetes mellitus with hyperglycemia: Secondary | ICD-10-CM

## 2020-09-05 NOTE — Progress Notes (Signed)
Order placed for CCM referral.  

## 2020-09-07 ENCOUNTER — Telehealth: Payer: Self-pay

## 2020-09-07 NOTE — Chronic Care Management (AMB) (Signed)
  Chronic Care Management   Note  09/07/2020 Name: Lorraine Foley MRN: [id number] DOB: 12-03-1940  Lorraine Foley is a 80 y.o. year old female who is a primary care patient of Einar Pheasant, MD. I reached out to Alanson Puls by phone today in response to a referral sent by Lorraine Foley's PCP, Dr. Nicki Reaper     Lorraine Foley was given information about Chronic Care Management services today including:  1. CCM service includes personalized support from designated clinical staff supervised by her physician, including individualized plan of care and coordination with other care providers 2. 24/7 contact phone numbers for assistance for urgent and routine care needs. 3. Service will only be billed when office clinical staff spend 20 minutes or more in a month to coordinate care. 4. Only one practitioner may furnish and bill the service in a calendar month. 5. The patient may stop CCM services at any time (effective at the end of the month) by phone call to the office staff. 6. The patient will be responsible for cost sharing (co-pay) of up to 20% of the service fee (after annual deductible is met).  Patient agreed to services and verbal consent obtained.   Follow up plan: Telephone appointment with care management team member scheduled for:09/10/2020  Noreene Larsson, [address], [place], [place] [address] Direct Dial: [phone] [email or web address]

## 2020-09-07 NOTE — Chronic Care Management (AMB) (Signed)
  Chronic Care Management   Outreach Note  09/07/2020 Name: Lorraine Foley MRN: [id number] DOB: March 16, 1941  Lorraine Foley is a 80 y.o. year old female who is a primary care patient of Dale [place], MD. I reached out to Lorraine Foley by phone today in response to a referral sent by Ms. Zenovia Jarred Virts's PCP, Dr. Lorin Picket     An unsuccessful telephone outreach was attempted today. The patient was referred to the case management team for assistance with care management and care coordination.   Follow Up Plan: A HIPAA compliant phone message was left for the patient providing contact information and requesting a return call.  The care management team will reach out to the patient again over the next 7 days.  If patient returns call to provider office, please advise to call Embedded Care Management Care Guide Penne Lash  at [phone]  Penne Lash, RMA Care Guide, Embedded Care Coordination [facility]  [place], [place] [address] Direct Dial: [phone] [email or web address]: [email or web address]

## 2020-09-08 LAB — POTASSIUM: Potassium: 4.8 mmol/L (ref 3.5–5.2)

## 2020-09-10 ENCOUNTER — Ambulatory Visit: Payer: 59 | Admitting: Pharmacist

## 2020-09-10 DIAGNOSIS — E7800 Pure hypercholesterolemia, unspecified: Secondary | ICD-10-CM

## 2020-09-10 DIAGNOSIS — I10 Essential (primary) hypertension: Secondary | ICD-10-CM

## 2020-09-10 DIAGNOSIS — E039 Hypothyroidism, unspecified: Secondary | ICD-10-CM

## 2020-09-10 DIAGNOSIS — N183 Chronic kidney disease, stage 3 unspecified: Secondary | ICD-10-CM

## 2020-09-10 DIAGNOSIS — E1165 Type 2 diabetes mellitus with hyperglycemia: Secondary | ICD-10-CM

## 2020-09-10 MED ORDER — EMPAGLIFLOZIN 10 MG PO TABS: 10.0000 mg | ORAL_TABLET | Freq: Every day | ORAL | 2 refills | Status: DC

## 2020-09-10 NOTE — Patient Instructions (Signed)
Ms. Sykora,   It was great talking with you today!  We started Jardiance 10 mg daily. Take this in the morning. Focus on staying well hydrated.   Our goal A1c is <7%. This corresponds with fasting glucose <130 and 2 hour after meal glucose <180. Check your glucose daily, alternating between fasting and after meals.   We also talked about increasing your physical activity (even if this is just walking laps around your house!)  Call with any questions or concerns!  Catie Feliz Beam, PharmD [phone]  Visit Information  Patient Care Plan: Medication Management    Problem Identified: Diabetes, Hypertension, Hyperlipidemia     Long-Range Goal: Disease Progression Prevention   Start Date: 09/10/2020  This Visit's Progress: On track  Priority: High  Note:   . Current Barriers:  . Unable to achieve control of diabetes   Pharmacist Clinical Goal(s):  Marland Kitchen Over the next 90 days, patient will achieve adherence to monitoring guidelines and medication adherence to achieve therapeutic efficacy . Over the next 90 days, patient will achieve control of diabetes as evidenced by A1c through collaboration with PharmD and provider.   Interventions: . 1:1 collaboration with Dale Logan, MD regarding development and update of comprehensive plan of care as evidenced by provider attestation and co-signature . Inter-disciplinary care team collaboration (see longitudinal plan of care) . Comprehensive medication review performed; medication list updated in electronic medical record  Health Maintenance: . Continues to work full time at [employer], working from home . Notes stressors over the past few months regarding her daughter moving from [place] to [place]  Diabetes: . Uncontrolled; current treatment: none  o Hx metformin  - GI upset (re-trial also limited by renal function) . Current glucose readings: fasting glucose: has not been checking lately . Denies hyperglycemic symptoms such as polyuria,  polydipsia . Current meal patterns: breakfast: hot tea, cereal, but planning on changing to oatmeal; snack: 2 poached egg whites; lunch: sometimes vegetables, cheese, frozen veggie patties; supper: salads, soups; drinks: water, lemon water; avoids juices . Current physical activity: was working out at [employer] before COVID. Her neighborhood is hilly and sidewalks are full of cracks, so she fears walking on her own. Does note that she can walk around her house/yard/driveway . Discussed SGLT2 given CKD, intolerance to metformin. Discussed mechanism, side effects, long term benefits of CKD progression risk reduction and ASCVD risk reductoin. Patient amenable. Start Jardiance 10 mg daily. Discussed focus on hydration to reduce risk of genitourinary infections . Recommend checking glucose daily, alternating between fasting and 2 hour post prandial. Reviewed goal A1c, goal fasting and goal 2 hour post prandial glucose  . Patient has Nurse, learning disability through work. Discussed Jardiance manufacturer savings card if needed.   Hypertension: . Controlled; current treatment: spironolactone/HCTZ 25/25 mg daily, quinapril 40 mg BID, amlodipine 5 mg BID, clonidine 0.1 mg TID . Current home readings: ~130/80s per occasional check.  . Continued current regimen at his time. Discussed impact of spironolactone and quinapril on potassium level . Monitor fluid status, BP with addition of SGLT2. . Continue to monitor anticholinergic/sedative effects of clonidine.   Hyperlipidemia: . Controlled per LDL goal <70, though TG were slightly elevated on last check; current treatment: rosuvastatin 20 mg daily  . Antiplatelet regimen: aspirin 81 mg daily  . Current dietary patterns: focuses on reducing fats/fatty meals . Continue current regimen at this time. Discussed impact of elevated glucose on TG  Gout: . Well managed, though no uric acid on file; reports last gout  flare was in last August; has not needed colchicine in a  while . Continue monitoring at this time. If flares with gout moving forward, consider elimination of HCTZ given impact on uric acid levels.   Hypothyroidism: . Controlled per last TSH; current treatment: levothyroxine 100 mcg daily . Continue current regimen at this time  Supplementation: Marland Kitchen Vitamin D 1000 units daily, Vitamin E 200 units daily, Vitamin C 500 mg daily, turmeric daily, glucosamine-chondroitin daily . Reviewed to monitoring potassium content of any new supplements. Possible increased bleed risk w/ aspirin and turmeric. Continue to monitor.  Patient Goals/Self-Care Activities . Over the next 90 days, patient will:  - take medications as prescribed check glucose daily, document, and provide at future appointments -focus on increasing physical activity  Follow Up Plan: Telephone follow up appointment with care management team member scheduled for: ~ 6 weeks      The patient verbalized understanding of instructions, educational materials, and care plan provided today and agreed to receive a mailed copy of patient instructions, educational materials, and care plan.   Plan: Telephone follow up appointment with care management team member scheduled for:  ~ 6 weeks  Catie Feliz Beam, PharmD, [place], CPP Clinical Pharmacist [employer] at [employer] [phone]

## 2020-09-10 NOTE — Chronic Care Management (AMB) (Signed)
Chronic Care Management Pharmacy Note  09/10/2020 Name:  Lorraine Foley MRN:  [id number] DOB:  [birth year]  Subjective: Lorraine Foley is an 80 y.o. year old female who is a primary patient of Einar Pheasant, MD.  The CCM team was consulted for assistance with disease management and care coordination needs.    Engaged with patient by telephone for initial visit in response to provider referral for pharmacy case management and/or care coordination services.   Consent to Services:  The patient was given the following information about Chronic Care Management services today, agreed to services, and gave verbal consent: 1. CCM service includes personalized support from designated clinical staff supervised by the primary care provider, including individualized plan of care and coordination with other care providers 2. 24/7 contact phone numbers for assistance for urgent and routine care needs. 3. Service will only be billed when office clinical staff spend 20 minutes or more in a month to coordinate care. 4. Only one practitioner may furnish and bill the service in a calendar month. 5.The patient may stop CCM services at any time (effective at the end of the month) by phone call to the office staff. 6. The patient will be responsible for cost sharing (co-pay) of up to 20% of the service fee (after annual deductible is met). Patient agreed to services and consent obtained.  Objective:  Lab Results  Component Value Date   CREATININE 1.20 (H) 09/02/2020   CREATININE 1.21 (H) 05/01/2020   CREATININE 1.13 (H) 12/25/2019   eGFR ~ 43 mL/min  Lab Results  Component Value Date   HGBA1C 8.0 (H) 09/02/2020       Component Value Date/Time   CHOL 145 09/02/2020 1053   TRIG 151 (H) 09/02/2020 1053   HDL 50 09/02/2020 1053   CHOLHDL 2.9 09/02/2020 1053   CHOLHDL 5.6 08/19/2019 1007   VLDL 49 (H) 08/19/2019 1007   LDLCALC 69 09/02/2020 1053    BP Readings from Last 3 Encounters:  08/31/20 138/80   05/09/20 (!) 148/68  05/04/20 138/68    Assessment: Review of patient past medical history, allergies, medications, health status, including review of consultants reports, laboratory and other test data, was performed as part of comprehensive evaluation and provision of chronic care management services.   SDOH:  (Social Determinants of Health) assessments and interventions performed:  SDOH Interventions   Flowsheet Row Most Recent Value  SDOH Interventions   Financial Strain Interventions Intervention Not Indicated  Stress Interventions Other (Comment)  [provided empathetic listening]      CCM Care Plan  Allergies  Allergen Reactions  . Metformin And Related Other (See Comments)    Swelling of the lips    Medications Reviewed Today    Reviewed by De Hollingshead, RPH-CPP (Pharmacist) on 09/10/20 at 0909  Med List Status: <None>  Medication Order Taking? Sig Documenting Provider Last Dose Status Informant  acetaminophen (TYLENOL) 650 MG CR tablet [id number] No Take 650 mg by mouth daily as needed for pain.  Patient not taking: Reported on 09/10/2020   [provider] Not Taking Active Self  amLODipine (NORVASC) 5 MG tablet [id number] Yes TAKE 1 TABLET BY MOUTH  TWICE DAILY Einar Pheasant, MD Taking Active   Cholecalciferol (VITAMIN D-3) 1000 UNITS CAPS [id number] Yes Take 1 capsule by mouth daily. [provider] Taking Active Self  cloNIDine (CATAPRES) 0.1 MG tablet [id number] Yes TAKE 1 TABLET BY MOUTH 3  TIMES DAILY Einar Pheasant, MD Taking Active   colchicine 0.6  MG tablet [id number] No Take 1 tablet (0.6 mg total) by mouth 2 (two) times daily as needed.  Patient not taking: Reported on 09/10/2020   Einar Pheasant, MD Not Taking Active   glucosamine-chondroitin 500-400 MG tablet [id number] Yes Take 1 tablet by mouth 3 (three) times daily. [provider] Taking Active   glucose blood test strip [id number] Yes Use as instructed to check blood sugars  once daily. Dx E11.9 Einar Pheasant, MD Taking Active Self  Lancets ([place]) lancets [id number] Yes Use as instructed to check blood sugars once daily. Dx E11.9 Einar Pheasant, MD Taking Active Self  levothyroxine (SYNTHROID) 100 MCG tablet [id number] Yes TAKE 1 TABLET BY MOUTH  DAILY Einar Pheasant, MD Taking Active   quinapril (ACCUPRIL) 40 MG tablet [id number] Yes TAKE 1 TABLET BY MOUTH  TWICE DAILY Einar Pheasant, MD Taking Active   ROCKLATAN 0.02-0.005 % SOLN [id number] Yes Apply to eye. [provider] Taking Active   rosuvastatin (CRESTOR) 20 MG tablet [id number] Yes TAKE 1 TABLET(20 MG) BY MOUTH DAILY Einar Pheasant, MD Taking Active   spironolactone-hydrochlorothiazide (ALDACTAZIDE) 25-25 MG tablet [id number] Yes TAKE 1 TABLET BY MOUTH  DAILY Einar Pheasant, MD Taking Active   timolol (TIMOPTIC) 0.25 % ophthalmic solution [id number] Yes  [provider] Taking Active   Turmeric 500 MG CAPS [id number] Yes Take 1 capsule by mouth daily. [provider] Taking Active Self  vitamin C (ASCORBIC ACID) 500 MG tablet [id number] Yes Take 500 mg by mouth daily. [provider] Taking Active Self  vitamin E 200 UNIT capsule [id number] Yes Take 200 Units by mouth daily. [provider] Taking Active   Med List Note Leeanne Rio, [facility] 06/24/19 1528): [facility]          Patient Active Problem List   Diagnosis Date Noted  . Gout 05/09/2020  . CKD (chronic kidney disease), stage III ([place]) 09/28/2019  . Stress 08/17/2019  . Healthcare maintenance 09/28/2018  . 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 Vitamin D deficiency 04/04/2017  . Primary osteoarthritis of left knee 01/31/2016  . 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 Renal insufficiency 11/30/2014  . BMI 39.0-39.9,adult 08/03/2014  . Bradycardia 04/13/2013  . Diabetes mellitus ([place]) 08/10/2012  . Hypertension 08/10/2012  . Hypercholesterolemia 08/10/2012  .  Glaucoma 08/10/2012  . GERD (gastroesophageal reflux disease) 08/10/2012  . Hypothyroidism 08/10/2012  . Type 2 diabetes mellitus with hyperglycemia ([place]) 08/10/2012    Conditions to be addressed/monitored: HTN, HLD, COPD and DMII  Care Plan : Medication Management  Updates made by De Hollingshead, RPH-CPP since 09/10/2020 12:00 AM    Problem: Diabetes, Hypertension, Hyperlipidemia     Long-Range Goal: Disease Progression Prevention   Start Date: 09/10/2020  This Visit's Progress: On track  Priority: High  Note:   . Current Barriers:  . Unable to achieve control of diabetes   Pharmacist Clinical Goal(s):  Marland Kitchen Over the next 90 days, patient will achieve adherence to monitoring guidelines and medication adherence to achieve therapeutic efficacy . Over the next 90 days, patient will achieve control of diabetes as evidenced by A1c through collaboration with PharmD and provider.   Interventions: . 1:1 collaboration with Einar Pheasant, MD regarding development and update of comprehensive plan of care as evidenced by provider attestation and co-signature . Inter-disciplinary care team collaboration (see longitudinal plan of care) . Comprehensive medication review performed; medication list updated in electronic medical record  Health Maintenance: . Continues to work full time  at [employer], working from home . Notes stressors over the past few months regarding her daughter moving from [place] to [place]  Diabetes: . Uncontrolled; current treatment: none  o Hx metformin  - GI upset (re-trial also limited by renal function) . Current glucose readings: fasting glucose: has not been checking lately . Denies hyperglycemic symptoms such as polyuria, polydipsia . Current meal patterns: breakfast: hot tea, cereal, but planning on changing to oatmeal; snack: 2 poached egg whites; lunch: sometimes vegetables, cheese, frozen veggie patties; supper: salads, soups; drinks: water, lemon water; avoids  juices . Current physical activity: was working out at [employer] before [employer]. Her neighborhood is hilly and sidewalks are full of cracks, so she fears walking on her own. Does note that she can walk around her house/yard/driveway . Discussed SGLT2 given CKD, intolerance to metformin. Discussed mechanism, side effects, long term benefits of CKD progression risk reduction and ASCVD risk reductoin. Patient amenable. Start Jardiance 10 mg daily. Discussed focus on hydration to reduce risk of genitourinary infections . Recommend checking glucose daily, alternating between fasting and 2 hour post prandial. Reviewed goal A1c, goal fasting and goal 2 hour post prandial glucose  . Patient has Pharmacist, community through work. Discussed Jardiance manufacturer savings card if needed.   Hypertension: . Controlled; current treatment: spironolactone/HCTZ 25/25 mg daily, quinapril 40 mg BID, amlodipine 5 mg BID, clonidine 0.1 mg TID . Current home readings: ~130/80s per occasional check.  . Continued current regimen at his time. Discussed impact of spironolactone and quinapril on potassium level . Monitor fluid status, BP with addition of SGLT2. . Continue to monitor anticholinergic/sedative effects of clonidine.   Hyperlipidemia: . Controlled per LDL goal <70, though TG were slightly elevated on last check; current treatment: rosuvastatin 20 mg daily  . Antiplatelet regimen: aspirin 81 mg daily  . Current dietary patterns: focuses on reducing fats/fatty meals . Continue current regimen at this time. Discussed impact of elevated glucose on TG  Gout: . Well managed, though no uric acid on file; reports last gout flare was in last August; has not needed colchicine in a while . Continue monitoring at this time. If flares with gout moving forward, consider elimination of HCTZ given impact on uric acid levels.   Hypothyroidism: . Controlled per last TSH; current treatment: levothyroxine 100 mcg daily . Continue  current regimen at this time  Supplementation: Marland Kitchen Vitamin D 1000 units daily, Vitamin E 200 units daily, Vitamin C 500 mg daily, turmeric daily, glucosamine-chondroitin daily . Reviewed to monitoring potassium content of any new supplements. Possible increased bleed risk w/ aspirin and turmeric. Continue to monitor.  Patient Goals/Self-Care Activities . Over the next 90 days, patient will:  - take medications as prescribed check glucose daily, document, and provide at future appointments -focus on increasing physical activity  Follow Up Plan: Telephone follow up appointment with care management team member scheduled for: ~ 6 weeks      Medication Assistance: None required.  Patient affirms current coverage meets needs.  Follow Up:  Patient agrees to Care Plan and Follow-up.  Plan: Telephone follow up appointment with care management team member scheduled for:  ~ 6 weeks  Catie Darnelle Maffucci, PharmD, [place], [place] Clinical Pharmacist [employer] at [employer] [phone]

## 2020-10-22 ENCOUNTER — Ambulatory Visit (INDEPENDENT_AMBULATORY_CARE_PROVIDER_SITE_OTHER): Payer: 59 | Admitting: Pharmacist

## 2020-10-22 DIAGNOSIS — N183 Chronic kidney disease, stage 3 unspecified: Secondary | ICD-10-CM

## 2020-10-22 DIAGNOSIS — E1165 Type 2 diabetes mellitus with hyperglycemia: Secondary | ICD-10-CM

## 2020-10-22 DIAGNOSIS — E7800 Pure hypercholesterolemia, unspecified: Secondary | ICD-10-CM | POA: Diagnosis not present

## 2020-10-22 DIAGNOSIS — E039 Hypothyroidism, unspecified: Secondary | ICD-10-CM

## 2020-10-22 DIAGNOSIS — I10 Essential (primary) hypertension: Secondary | ICD-10-CM

## 2020-10-22 MED ORDER — GLUCOSE BLOOD VI STRP: ORAL_STRIP | 3 refills | Status: DC

## 2020-10-22 NOTE — Patient Instructions (Addendum)
Ms. Sweigert,   Keep up the great work!  Continue Jardiance 10 mg daily. For a goal A1c of <7%, our goal fasting glucose is <130 and goal 2 hour post meal glucose <180.   If your Jardiance copay is more than $10 per 3 months, you can use the [place] manufacturer savings card available on their website (NameRegulator.es). I am unsure if [facility] mail order pharmacy will process these cards, or if you would need to get the medication from your local [facility] to use these savings. Let me know if you need [place] to send a 90 day supply to either pharmacy.   Take care!  Catie Feliz Beam, PharmD [phone]  Visit Information  PATIENT GOALS: Goals Addressed              This Visit's Progress     Patient Stated   .  Medication Monitoring (pt-stated)        Patient Goals/Self-Care Activities . Over the next 90 days, patient will:  - take medications as prescribed check glucose daily, document, and provide at future appointments -focus on increasing physical activity       The patient verbalized understanding of instructions, educational materials, and care plan provided today and agreed to receive a mailed copy of patient instructions, educational materials, and care plan.   Plan: Telephone follow up appointment with care management team member scheduled for:  ~12 weeks  Catie Feliz Beam, PharmD, [place], CPP Clinical Pharmacist [employer] at [employer] [phone]

## 2020-10-22 NOTE — Progress Notes (Signed)
Chronic Care Management Pharmacy Note  10/22/2020 Name:  Lorraine Foley MRN:  [id number] DOB:  [birth year]  Subjective: Lorraine Foley is an 80 y.o. year old female who is a primary patient of Einar Pheasant, MD.  The CCM team was consulted for assistance with disease management and care coordination needs.    Engaged with patient by telephone for follow up visit in response to provider referral for pharmacy case management and/or care coordination services.   Consent to Services:  The patient was given information about Chronic Care Management services, agreed to services, and gave verbal consent prior to initiation of services.  Please see initial visit note for detailed documentation.   Patient Care Team: Einar Pheasant, MD as PCP - General (Internal Medicine) De Hollingshead, RPH-CPP (Pharmacist)  Recent office visits:  None  Recent consult visits:  None  Hospital visits: None in previous 6 months  Objective:  Lab Results  Component Value Date   CREATININE 1.20 (H) 09/02/2020   BUN 21 09/02/2020   GFRNONAA 43 (L) 09/02/2020   GFRAA 50 (L) 09/02/2020   NA 143 09/02/2020   K 4.8 09/07/2020   CALCIUM 10.1 09/02/2020   CO2 27 09/02/2020    Lab Results  Component Value Date/Time   HGBA1C 8.0 (H) 09/02/2020 10:53 AM   HGBA1C 6.9 (H) 05/01/2020 09:50 AM    Last diabetic Eye exam:  Lab Results  Component Value Date/Time   HMDIABEYEEXA No Retinopathy 07/31/2020 12:00 AM    Last diabetic Foot exam:  Lab Results  Component Value Date/Time   HMDIABFOOTEX on my exam 04/03/2017 12:00 AM     Lab Results  Component Value Date   CHOL 145 09/02/2020   HDL 50 09/02/2020   LDLCALC 69 09/02/2020   TRIG 151 (H) 09/02/2020   CHOLHDL 2.9 09/02/2020    Hepatic Function Latest Ref Rng & Units 09/02/2020 05/01/2020 12/25/2019  Total Protein 6.0 - 8.5 g/dL 7.2 7.4 7.0  Albumin 3.7 - 4.7 g/dL 4.5 4.9(H) 4.4  AST 0 - 40 IU/L $Remov'15 15 15  'OefStT$ ALT 0 - 32 IU/L $Remov'17 14 16  'CfJXvb$ Alk  Phosphatase 44 - 121 IU/L 57 55 50  Total Bilirubin 0.0 - 1.2 mg/dL 0.7 0.7 0.5  Bilirubin, Direct 0.00 - 0.40 mg/dL 0.17 0.17 0.15    Lab Results  Component Value Date/Time   TSH 0.885 09/02/2020 10:53 AM   TSH 1.040 12/25/2019 09:42 AM    CBC Latest Ref Rng & Units 05/01/2020 04/29/2019 07/24/2018  WBC 3.4 - 10.8 x10E3/uL 6.7 5.8 6.7  Hemoglobin 11.1 - 15.9 g/dL 15.0 14.1 13.6  Hematocrit 34.0 - 46.6 % 43.4 40.3 40.0  Platelets 150 - 450 x10E3/uL 267 250 261    Lab Results  Component Value Date/Time   VD25OH 33.3 05/01/2020 09:50 AM   VD25OH 32.9 08/18/2017 10:44 AM    Clinical ASCVD: No  The 10-year ASCVD risk score Mikey Bussing DC Jr., et al., 2013) is: 57%   Values used to calculate the score:     Age: 43 years     Sex: Female     Is Non-Hispanic African American: No     Diabetic: Yes     Tobacco smoker: No     Systolic Blood Pressure: 948 mmHg     Is BP treated: Yes     HDL Cholesterol: 50 mg/dL     Total Cholesterol: 145 mg/dL    Depression screen [facility] 2/9 04/29/2020 08/12/2019 07/06/2017  Decreased Interest 0 0  0  Down, Depressed, Hopeless 0 0 0  PHQ - 2 Score 0 0 0     Social History   Tobacco Use  Smoking Status Never Smoker  Smokeless Tobacco Never Used   BP Readings from Last 3 Encounters:  08/31/20 138/80  05/09/20 (!) 148/68  05/04/20 138/68   Pulse Readings from Last 3 Encounters:  05/08/20 69  04/29/20 (!) 50  04/12/19 (!) 52   Wt Readings from Last 3 Encounters:  08/31/20 200 lb (90.7 kg)  05/08/20 202 lb (91.6 kg)  04/29/20 201 lb 12.8 oz (91.5 kg)    Assessment/Interventions: Review of patient past medical history, allergies, medications, health status, including review of consultants reports, laboratory and other test data, was performed as part of comprehensive evaluation and provision of chronic care management services.   SDOH:  (Social Determinants of Health) assessments and interventions performed: Yes SDOH Interventions   Flowsheet Row  Most Recent Value  SDOH Interventions   Financial Strain Interventions Intervention Not Indicated      CCM Care Plan  Allergies  Allergen Reactions  . Metformin And Related Other (See Comments)    Swelling of the lips    Medications Reviewed Today    Reviewed by De Hollingshead, RPH-CPP (Pharmacist) on 10/22/20 at [place] List Status: <None>  Medication Order Taking? Sig Documenting Provider Last Dose Status Informant  acetaminophen (TYLENOL) 650 MG CR tablet [id number] Yes Take 650 mg by mouth daily as needed for pain. [provider] Taking Active   amLODipine (NORVASC) 5 MG tablet [id number] Yes TAKE 1 TABLET BY MOUTH  TWICE DAILY Einar Pheasant, MD Taking Active   aspirin EC 81 MG tablet [id number] Yes Take 81 mg by mouth daily. Swallow whole. [provider] Taking Active   Cholecalciferol (VITAMIN D-3) 1000 UNITS CAPS [id number] Yes Take 1 capsule by mouth daily. [provider] Taking Active Self  cloNIDine (CATAPRES) 0.1 MG tablet [id number] Yes TAKE 1 TABLET BY MOUTH 3  TIMES DAILY Einar Pheasant, MD Taking Active   colchicine 0.6 MG tablet [id number]  Take 1 tablet (0.6 mg total) by mouth 2 (two) times daily as needed.  Patient not taking: Reported on 09/10/2020   Einar Pheasant, MD  Active   empagliflozin (JARDIANCE) 10 MG TABS tablet [id number] Yes Take 1 tablet (10 mg total) by mouth daily before breakfast. Einar Pheasant, MD Taking Active   glucosamine-chondroitin 500-400 MG tablet [id number] Yes Take 1 tablet by mouth 3 (three) times daily. [provider] Taking Active   glucose blood test strip [id number] Yes Use as instructed to check blood sugars once daily. Dx E11.9 Einar Pheasant, MD Taking Active Self  Lancets ([place]) lancets [id number] Yes Use as instructed to check blood sugars once daily. Dx E11.9 Einar Pheasant, MD Taking Active Self  levothyroxine (SYNTHROID) 100 MCG tablet [id number] Yes TAKE 1 TABLET BY MOUTH   DAILY Einar Pheasant, MD Taking Active   quinapril (ACCUPRIL) 40 MG tablet [id number] Yes TAKE 1 TABLET BY MOUTH  TWICE DAILY Einar Pheasant, MD Taking Active   ROCKLATAN 0.02-0.005 % SOLN [id number] Yes Apply 1 drop to eye at bedtime. [provider] Taking Active   rosuvastatin (CRESTOR) 20 MG tablet [id number] Yes TAKE 1 TABLET(20 MG) BY MOUTH DAILY Einar Pheasant, MD Taking Active   spironolactone-hydrochlorothiazide (ALDACTAZIDE) 25-25 MG tablet [id number] Yes TAKE 1 TABLET BY MOUTH  DAILY Einar Pheasant, MD Taking Active   timolol (TIMOPTIC) 0.25 % ophthalmic solution [id number] Yes Place 1 drop  into both eyes 2 (two) times daily. [provider] Taking Active   Turmeric 500 MG CAPS [id number] Yes Take 1 capsule by mouth daily. [provider] Taking Active Self  vitamin C (ASCORBIC ACID) 500 MG tablet [id number] Yes Take 500 mg by mouth daily. [provider] Taking Active Self  vitamin E 200 UNIT capsule [id number] Yes Take 200 Units by mouth daily. [provider] Taking Active   Med List Note Leeanne Rio, [facility] 06/24/19 1528): [facility]          Patient Active Problem List   Diagnosis Date Noted  . Gout 05/09/2020  . CKD (chronic kidney disease), stage III ([place]) 09/28/2019  . Stress 08/17/2019  . Healthcare maintenance 09/28/2018  . 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 Vitamin D deficiency 04/04/2017  . Primary osteoarthritis of left knee 01/31/2016  . 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 Renal insufficiency 11/30/2014  . BMI 39.0-39.9,adult 08/03/2014  . Bradycardia 04/13/2013  . Diabetes mellitus ([place]) 08/10/2012  . Hypertension 08/10/2012  . Hypercholesterolemia 08/10/2012  . Glaucoma 08/10/2012  . GERD (gastroesophageal reflux disease) 08/10/2012  . Hypothyroidism 08/10/2012  . Type 2 diabetes mellitus with hyperglycemia ([place]) 08/10/2012    Immunization History  Administered Date(s) Administered   . Fluad Quad(high Dose 65+) 05/13/2019  . Influenza Split 07/14/2014  . Influenza Whole 07/17/2012  . Influenza, High Dose Seasonal PF 06/06/2017, 06/06/2018  . Influenza-Unspecified 06/20/2015, 06/14/2016, 05/16/2020  . PFIZER(Purple Top)SARS-COV-2 Vaccination 09/04/2019, 09/28/2019, 05/27/2020  . Pneumococcal Conjugate-13 11/05/2013  . Pneumococcal Polysaccharide-23 09/26/2011  . Zoster Recombinat (Shingrix) 05/09/2018, 11/01/2018    Conditions to be addressed/monitored:  Hypertension, Hyperlipidemia and Diabetes  Care Plan : Medication Management  Updates made by De Hollingshead, RPH-CPP since 10/22/2020 12:00 AM    Problem: Diabetes, Hypertension, Hyperlipidemia     Long-Range Goal: Disease Progression Prevention   Start Date: 09/10/2020  Recent Progress: On track  Priority: High  Note:   . Current Barriers:  . Unable to achieve control of diabetes   Pharmacist Clinical Goal(s):  Marland Kitchen Over the next 90 days, patient will achieve adherence to monitoring guidelines and medication adherence to achieve therapeutic efficacy . Over the next 90 days, patient will achieve control of diabetes as evidenced by A1c through collaboration with PharmD and provider.   Interventions: . 1:1 collaboration with Einar Pheasant, MD regarding development and update of comprehensive plan of care as evidenced by provider attestation and co-signature . Inter-disciplinary care team collaboration (see longitudinal plan of care) . Comprehensive medication review performed; medication list updated in electronic medical record  SDOH: . Continues to work full time at [employer], working from home.  . Notes improvement in stress about her daughter moving from [place] to [place] Maintenance: . Up to date on COVID vaccinations . Due for foot exam  Diabetes: . Uncontrolled; current treatment: Jardiance 10 mg daily (x~ 3 weeks, was afraid to start for a few weeks after reading package insert) - denies  any concerns since starting Jardiance, including s/sx genitourinary infections or dehydration o Hx metformin  - GI upset (re-trial also limited by renal function) . Current glucose readings: fasting glucose: this morning was 135, reports fastings generally ~135-177; highest post prandial was 362, but isolated incident; post prandials: 170-210s . Denies hyperglycemic symptoms such as polyuria, polydipsia . Current meal patterns: breakfast: cereal- cheerios + bran flakes, occasional oatmeal; lunch: veggie burgers, cucumbers, bell peppers; once in a while has bowl of soup;  salads; supper: salad; yogurt, blueberries;  Marland Kitchen Current physical activity: gets up and moves every hour- walks down the hallway and back; No gym since [place], neighborhood isn't safe for walking . Recommend checking glucose daily, alternating between fasting and 2 hour post prandial. Reviewed goal A1c, goal fasting and goal 2 hour post prandial glucose . Continue current regimen at this time.   Hypertension: . Controlled per home readings and per last clinic reading; current treatment: spironolactone/HCTZ 25/25 mg daily, quinapril 40 mg BID, amlodipine 5 mg BID, clonidine 0.1 mg TID . Current home readings: ~130-140s/80s per occasional check. Reports increased readings with stress. . Continue current regimen at this time.  . Continue to monitor anticholinergic/sedative effects of clonidine.   Hyperlipidemia: . Controlled per LDL goal <70, though TG were slightly elevated on last check; current treatment: rosuvastatin 20 mg daily  . Antiplatelet regimen: aspirin 81 mg daily  . Current dietary patterns: focuses on reducing fats/fatty meals, mostly eating vegetables and fruits . Continue current regimen at this time.  Gout: . Well managed, though no uric acid on file; reports last gout flare was in last August; has not needed colchicine in a while . Continue monitoring at this time. If flares with gout moving forward, consider  elimination of HCTZ given impact on uric acid levels.   Hypothyroidism: . Controlled per last TSH; current treatment: levothyroxine 100 mcg daily . Continue current regimen at this time  Supplementation: Marland Kitchen Vitamin D 1000 units daily, Vitamin E 200 units daily, Vitamin C 500 mg daily, turmeric daily, glucosamine-chondroitin daily . Reviewed to monitoring potassium content of any new supplements. Possible increased bleed risk w/ aspirin and turmeric. Continue to monitor.  Patient Goals/Self-Care Activities . Over the next 90 days, patient will:  - take medications as prescribed check glucose daily, document, and provide at future appointments -focus on increasing physical activity  Follow Up Plan: Telephone follow up appointment with care management team member scheduled for: ~ 12 weeks- patient requested to defer until after PCP visit        Medication Assistance: None required.  Patient affirms current coverage meets needs. Reviewed availability of savings card on [employer].   Patient's preferred pharmacy is:  [facility], [address], [address] [place], [place] [address] Phone: [phone] Fax: [facility] #[id number] [place], [place] - [address] [place] [address] Phone: [phone] Fax: [phone]    Care Plan and Follow Up Patient Decision:  Patient agrees to Care Plan and Follow-up.  Plan: Telephone follow up appointment with care management team member scheduled for:  ~12 weeks  Catie Darnelle Maffucci, PharmD, [place], [place] Clinical Pharmacist [employer] at [employer] [phone]

## 2020-11-21 ENCOUNTER — Other Ambulatory Visit: Payer: Self-pay | Admitting: Internal Medicine

## 2020-11-21 DIAGNOSIS — E1165 Type 2 diabetes mellitus with hyperglycemia: Secondary | ICD-10-CM

## 2020-11-23 ENCOUNTER — Ambulatory Visit: Payer: 59 | Admitting: Pharmacist

## 2020-11-23 DIAGNOSIS — I10 Essential (primary) hypertension: Secondary | ICD-10-CM

## 2020-11-23 DIAGNOSIS — E1165 Type 2 diabetes mellitus with hyperglycemia: Secondary | ICD-10-CM

## 2020-11-23 DIAGNOSIS — E7800 Pure hypercholesterolemia, unspecified: Secondary | ICD-10-CM

## 2020-11-23 MED ORDER — BLOOD GLUCOSE METER KIT: PACK | 3 refills | Status: DC

## 2020-11-23 MED ORDER — BLOOD GLUCOSE METER KIT: PACK | 3 refills | Status: AC

## 2020-11-23 NOTE — Chronic Care Management (AMB) (Signed)
Chronic Care Management Pharmacy Note  11/23/2020 Name:  Lorraine Foley MRN:  [id number] DOB:  Sep 25, 1940  Subjective: Lorraine Foley is an 80 y.o. year old female who is a primary patient of Einar Pheasant, MD.  The CCM team was consulted for assistance with disease management and care coordination needs.    Engaged with patient by telephone for device access in response to provider referral for pharmacy case management and/or care coordination services.   Consent to Services:  The patient was given information about Chronic Care Management services, agreed to services, and gave verbal consent prior to initiation of services.  Please see initial visit note for detailed documentation.   Patient Care Team: Einar Pheasant, MD as PCP - General (Internal Medicine) De Hollingshead, RPH-CPP (Pharmacist)  Recent office visits: None since our last call  Recent consult visits: None since our last call  Hospital visits: None in previous 6 months  Objective:  Lab Results  Component Value Date   CREATININE 1.20 (H) 09/02/2020   CREATININE 1.21 (H) 05/01/2020   CREATININE 1.13 (H) 12/25/2019    Lab Results  Component Value Date   HGBA1C 8.0 (H) 09/02/2020   Last diabetic Eye exam:  Lab Results  Component Value Date/Time   HMDIABEYEEXA No Retinopathy 07/31/2020 12:00 AM    Last diabetic Foot exam:  Lab Results  Component Value Date/Time   HMDIABFOOTEX on my exam 04/03/2017 12:00 AM        Component Value Date/Time   CHOL 145 09/02/2020 1053   TRIG 151 (H) 09/02/2020 1053   HDL 50 09/02/2020 1053   CHOLHDL 2.9 09/02/2020 1053   CHOLHDL 5.6 08/19/2019 1007   VLDL 49 (H) 08/19/2019 1007   LDLCALC 69 09/02/2020 1053    Hepatic Function Latest Ref Rng & Units 09/02/2020 05/01/2020 12/25/2019  Total Protein 6.0 - 8.5 g/dL 7.2 7.4 7.0  Albumin 3.7 - 4.7 g/dL 4.5 4.9(H) 4.4  AST 0 - 40 IU/L _0 ALT 0 - 32 IU/L _1 Alk Phosphatase 44 - 121 IU/L 57 55 50   Total Bilirubin 0.0 - 1.2 mg/dL 0.7 0.7 0.5  Bilirubin, Direct 0.00 - 0.40 mg/dL 0.17 0.17 0.15    Lab Results  Component Value Date/Time   TSH 0.885 09/02/2020 10:53 AM   TSH 1.040 12/25/2019 09:42 AM    CBC Latest Ref Rng & Units 05/01/2020 04/29/2019 07/24/2018  WBC 3.4 - 10.8 x10E3/uL 6.7 5.8 6.7  Hemoglobin 11.1 - 15.9 g/dL 15.0 14.1 13.6  Hematocrit 34.0 - 46.6 % 43.4 40.3 40.0  Platelets 150 - 450 x10E3/uL 267 250 261    Lab Results  Component Value Date/Time   VD25OH 33.3 05/01/2020 09:50 AM   VD25OH 32.9 08/18/2017 10:44 AM    Clinical ASCVD: No  The 10-year ASCVD risk score Mikey Bussing DC Jr., et al., 2013) is: 57%   Values used to calculate the score:     Age: 54 years     Sex: Female     Is Non-Hispanic African American: No     Diabetic: Yes     Tobacco smoker: No     Systolic Blood Pressure: 440 mmHg     Is BP treated: Yes     HDL Cholesterol: 50 mg/dL     Total Cholesterol: 145 mg/dL     Social History   Tobacco Use  Smoking Status Never Smoker  Smokeless Tobacco Never Used   BP Readings from Last 3  Encounters:  08/31/20 138/80  05/09/20 (!) 148/68  05/04/20 138/68   Pulse Readings from Last 3 Encounters:  05/08/20 69  04/29/20 (!) 50  04/12/19 (!) 52   Wt Readings from Last 3 Encounters:  08/31/20 200 lb (90.7 kg)  05/08/20 202 lb (91.6 kg)  04/29/20 201 lb 12.8 oz (91.5 kg)    Assessment: Review of patient past medical history, allergies, medications, health status, including review of consultants reports, laboratory and other test data, was performed as part of comprehensive evaluation and provision of chronic care management services.   SDOH:  (Social Determinants of Health) assessments and interventions performed: none today   CCM Care Plan  Allergies  Allergen Reactions  . Metformin And Related Other (See Comments)    Swelling of the lips    Medications Reviewed Today    Reviewed by De Hollingshead, RPH-CPP (Pharmacist) on  10/22/20 at [place] List Status: <None>  Medication Order Taking? Sig Documenting Provider Last Dose Status Informant  acetaminophen (TYLENOL) 650 MG CR tablet [id number] Yes Take 650 mg by mouth daily as needed for pain. [provider] Taking Active   amLODipine (NORVASC) 5 MG tablet [id number] Yes TAKE 1 TABLET BY MOUTH  TWICE DAILY Einar Pheasant, MD Taking Active   aspirin EC 81 MG tablet [id number] Yes Take 81 mg by mouth daily. Swallow whole. [provider] Taking Active   Cholecalciferol (VITAMIN D-3) 1000 UNITS CAPS [id number] Yes Take 1 capsule by mouth daily. [provider] Taking Active Self  cloNIDine (CATAPRES) 0.1 MG tablet [id number] Yes TAKE 1 TABLET BY MOUTH 3  TIMES DAILY Einar Pheasant, MD Taking Active   colchicine 0.6 MG tablet [id number]  Take 1 tablet (0.6 mg total) by mouth 2 (two) times daily as needed.  Patient not taking: Reported on 09/10/2020   Einar Pheasant, MD  Active   empagliflozin (JARDIANCE) 10 MG TABS tablet [id number] Yes Take 1 tablet (10 mg total) by mouth daily before breakfast. Einar Pheasant, MD Taking Active   glucosamine-chondroitin 500-400 MG tablet [id number] Yes Take 1 tablet by mouth 3 (three) times daily. [provider] Taking Active   glucose blood test strip [id number] Yes Use as instructed to check blood sugars once daily. Dx E11.9 Einar Pheasant, MD Taking Active Self  Lancets ([place]) lancets [id number] Yes Use as instructed to check blood sugars once daily. Dx E11.9 Einar Pheasant, MD Taking Active Self  levothyroxine (SYNTHROID) 100 MCG tablet [id number] Yes TAKE 1 TABLET BY MOUTH  DAILY Einar Pheasant, MD Taking Active   quinapril (ACCUPRIL) 40 MG tablet [id number] Yes TAKE 1 TABLET BY MOUTH  TWICE DAILY Einar Pheasant, MD Taking Active   ROCKLATAN 0.02-0.005 % SOLN [id number] Yes Apply 1 drop to eye at bedtime. [provider] Taking Active   rosuvastatin (CRESTOR) 20 MG tablet  [id number] Yes TAKE 1 TABLET(20 MG) BY MOUTH DAILY Einar Pheasant, MD Taking Active   spironolactone-hydrochlorothiazide (ALDACTAZIDE) 25-25 MG tablet [id number] Yes TAKE 1 TABLET BY MOUTH  DAILY Einar Pheasant, MD Taking Active   timolol (TIMOPTIC) 0.25 % ophthalmic solution [id number] Yes Place 1 drop into both eyes 2 (two) times daily. [provider] Taking Active   Turmeric 500 MG CAPS [id number] Yes Take 1 capsule by mouth daily. [provider] Taking Active Self  vitamin C (ASCORBIC ACID) 500 MG tablet [id number] Yes Take 500 mg by mouth daily. [provider] Taking Active Self  vitamin E 200 UNIT capsule [id number] Yes Take 200 Units  by mouth daily. [provider] Taking Active   Med List Note Leeanne Rio, [facility] 06/24/19 1528): [facility]          Patient Active Problem List   Diagnosis Date Noted  . Gout 05/09/2020  . CKD (chronic kidney disease), stage III ([place]) 09/28/2019  . Stress 08/17/2019  . Healthcare maintenance 09/28/2018  . 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 Vitamin D deficiency 04/04/2017  . Primary osteoarthritis of left knee 01/31/2016  . 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 Renal insufficiency 11/30/2014  . BMI 39.0-39.9,adult 08/03/2014  . Bradycardia 04/13/2013  . Diabetes mellitus ([place]) 08/10/2012  . Hypertension 08/10/2012  . Hypercholesterolemia 08/10/2012  . Glaucoma 08/10/2012  . GERD (gastroesophageal reflux disease) 08/10/2012  . Hypothyroidism 08/10/2012  . Type 2 diabetes mellitus with hyperglycemia ([place]) 08/10/2012    Immunization History  Administered Date(s) Administered  . Fluad Quad(high Dose 65+) 05/13/2019  . Influenza Split 07/14/2014  . Influenza Whole 07/17/2012  . Influenza, High Dose Seasonal PF 06/06/2017, 06/06/2018  . Influenza-Unspecified 06/20/2015, 06/14/2016, 05/16/2020  . PFIZER(Purple Top)SARS-COV-2 Vaccination 09/04/2019, 09/28/2019, 05/27/2020  .  Pneumococcal Conjugate-13 11/05/2013  . Pneumococcal Polysaccharide-23 09/26/2011  . Zoster Recombinat (Shingrix) 05/09/2018, 11/01/2018    Conditions to be addressed/monitored: HTN, HLD and DMII  Care Plan : Medication Management  Updates made by De Hollingshead, RPH-CPP since 11/23/2020 12:00 AM    Problem: Diabetes, Hypertension, Hyperlipidemia     Long-Range Goal: Disease Progression Prevention   Start Date: 09/10/2020  This Visit's Progress: On track  Recent Progress: On track  Priority: High  Note:   . Current Barriers:  . Unable to achieve control of diabetes   Pharmacist Clinical Goal(s):  Marland Kitchen Over the next 90 days, patient will achieve adherence to monitoring guidelines and medication adherence to achieve therapeutic efficacy . Over the next 90 days, patient will achieve control of diabetes as evidenced by A1c through collaboration with PharmD and provider.   Interventions: . 1:1 collaboration with Einar Pheasant, MD regarding development and update of comprehensive plan of care as evidenced by provider attestation and co-signature . Inter-disciplinary care team collaboration (see longitudinal plan of care) . Comprehensive medication review performed; medication list updated in electronic medical record   Diabetes: . Uncontrolled; current treatment: Jardiance 10 mg daily o Hx metformin  - GI upset (re-trial also limited by renal function) . Current glucose readings: requests new script for glucometer to [facility].  . Denies hyperglycemic symptoms such as polyuria, polydipsia . Faxed new script for glucometer to [facility]. Notified patient.   Hypertension: . Controlled per home readings and per last clinic reading; current treatment: spironolactone/HCTZ 25/25 mg daily, quinapril 40 mg BID, amlodipine 5 mg BID, clonidine 0.1 mg TID . Previously recommended to continue current regimen at this time.    Hyperlipidemia: . Controlled per LDL goal <70, though TG  were slightly elevated on last check; current treatment: rosuvastatin 20 mg daily  . Antiplatelet regimen: aspirin 81 mg daily  . Current dietary patterns: focuses on reducing fats/fatty meals, mostly eating vegetables and fruits . Previously recommended to continue current regimen at this time.  Gout: . Well managed, though no uric acid on file; reports last gout flare was in last August; has not needed colchicine in a while . Continue monitoring at this time. If flares with gout moving forward, consider elimination of HCTZ given impact on uric acid levels.   Hypothyroidism: . Controlled per last TSH; current treatment: levothyroxine  100 mcg daily . Previously recommended to continue current regimen at this time  Supplementation: Marland Kitchen Vitamin D 1000 units daily, Vitamin E 200 units daily, Vitamin C 500 mg daily, turmeric daily, glucosamine-chondroitin daily . Previously recommended to continue current regimen at this time  Patient Goals/Self-Care Activities . Over the next 90 days, patient will:  - take medications as prescribed check glucose daily, document, and provide at future appointments -focus on increasing physical activity  Follow Up Plan: Telephone follow up appointment with care management team member scheduled for: ~ 8 weeks as previously scheduled      Medication Assistance: None required.  Patient affirms current coverage meets needs.  Patient's preferred pharmacy is:  [facility], [address], [address] [place], [place] [address] Phone: [phone] Fax: [phone] [place], [place] - [address] [place] [address] Phone: [phone] Fax: [phone]   Follow Up:  Patient agrees to Care Plan and Follow-up.  Plan: Telephone follow up appointment with care management team member scheduled for:  ~ 8 weeks as  previously scheduled  Catie Darnelle Maffucci, PharmD, [place], [place] Clinical Pharmacist [employer] at [employer] [phone]

## 2020-11-23 NOTE — Patient Instructions (Signed)
Visit Information  PATIENT GOALS: Goals Addressed              This Visit's Progress     Patient Stated   .  Medication Monitoring (pt-stated)        Patient Goals/Self-Care Activities . Over the next 90 days, patient will:  - take medications as prescribed check glucose daily, document, and provide at future appointments -focus on increasing physical activity        Patient verbalizes understanding of instructions provided today and agrees to view in MyChart.    Plan: Telephone follow up appointment with care management team member scheduled for:  ~ 8 weeks as previously scheduled  Catie Feliz Beam, PharmD, [place], CPP Clinical Pharmacist [employer] at [employer] [phone]

## 2020-12-22 ENCOUNTER — Ambulatory Visit: Payer: 59 | Admitting: Internal Medicine

## 2020-12-22 ENCOUNTER — Other Ambulatory Visit: Payer: Self-pay

## 2020-12-22 VITALS — BP 128/74 | HR 70 | Temp 96.5°F | Resp 16 | Ht 60.0 in | Wt 202.0 lb

## 2020-12-22 DIAGNOSIS — I10 Essential (primary) hypertension: Secondary | ICD-10-CM

## 2020-12-22 DIAGNOSIS — E7800 Pure hypercholesterolemia, unspecified: Secondary | ICD-10-CM

## 2020-12-22 DIAGNOSIS — E039 Hypothyroidism, unspecified: Secondary | ICD-10-CM | POA: Diagnosis not present

## 2020-12-22 DIAGNOSIS — E1165 Type 2 diabetes mellitus with hyperglycemia: Secondary | ICD-10-CM | POA: Diagnosis not present

## 2020-12-22 DIAGNOSIS — Z6839 Body mass index (BMI) 39.0-39.9, adult: Secondary | ICD-10-CM

## 2020-12-22 DIAGNOSIS — M109 Gout, unspecified: Secondary | ICD-10-CM

## 2020-12-22 DIAGNOSIS — N183 Chronic kidney disease, stage 3 unspecified: Secondary | ICD-10-CM

## 2020-12-22 DIAGNOSIS — F439 Reaction to severe stress, unspecified: Secondary | ICD-10-CM

## 2020-12-22 LAB — HM DIABETES FOOT EXAM

## 2020-12-22 NOTE — Progress Notes (Signed)
Patient ID: Lorraine Foley, female   DOB: 1941/03/14, 80 y.o.   MRN: [id number]   Subjective:    Patient ID: Lorraine Foley, female    DOB: [birth year], 80 y.o.   MRN: [id number]  HPI This visit occurred during the SARS-CoV-2 public health emergency.  Safety protocols were in place, including screening questions prior to the visit, additional usage of staff PPE, and extensive cleaning of exam room while observing appropriate contact time as indicated for disinfecting solutions.  Patient here for a scheduled follow up.  Here to follow up regarding her blood sugar, cholesterol and blood pressure.  Had recent gout flare.  Took colchicine.  Resolved now.  She has had two flares now.  Discussed stopping hctz.  Her blood pressure has been doing well.  She prefers not to stop the hctz.  Reports her blood pressure is dong well and she prefers not to stop at this time.  Discussed diet adjustment.  She is going to work on her diet.  No chest pain or sob reported.  No abdominal pain or bowel change reported.  Blood pressures - 174Y systolic readings.  Tolerating jardiance.  Sugars 160 in am.  Unsure of pm readings, but states relatively stable.  Planning to retire 02/2021.   Past Medical History:  Diagnosis Date  . Arthritis    knees  . Diabetes mellitus ([place])   . GERD (gastroesophageal reflux disease)   . Glaucoma   . Hypercholesterolemia   . Hypertension   . Hypothyroidism   . Pneumonia    Past Surgical History:  Procedure Laterality Date  . CATARACT EXTRACTION W/PHACO Left 11/09/2016   Procedure: CATARACT EXTRACTION PHACO AND INTRAOCULAR LENS PLACEMENT ([place])  left diabetic complicated;  Surgeon: Leandrew Koyanagi, MD;  Location: [facility];  Service: Ophthalmology;  Laterality: Left;  Diabetic - oral meds  . CATARACT EXTRACTION W/PHACO Right 03/08/2017   Procedure: CATARACT EXTRACTION PHACO AND INTRAOCULAR LENS PLACEMENT ([place])  Right Diabetic complicated;  Surgeon: Leandrew Koyanagi, MD;   Location: [facility];  Service: Ophthalmology;  Laterality: Right;  . EYE SURGERY Bilateral 08/2014, 2.2016   laser surgery in preparation for glaucoma surgery  . KNEE ARTHROPLASTY Left 08/06/2018   Procedure: COMPUTER ASSISTED TOTAL KNEE ARTHROPLASTY;  Surgeon: Dereck Leep, MD;  Location: [facility];  Service: Orthopedics;  Laterality: Left;  . TOTAL HIP ARTHROPLASTY  5/13   right   Family History  Problem Relation Age of Onset  . Mental illness Father        suicide  . Liver disease Father        alcohol  . Glaucoma Father   . Alcohol abuse Father   . Blindness Father   . Glaucoma Mother   . Osteoporosis Mother   . Diabetes Sister   . Breast cancer Sister 53  . Colon cancer Neg Hx    Social History   Socioeconomic History  . Marital status: Divorced    Spouse name: Not on file  . Number of children: 1  . Years of education: Not on file  . Highest education level: Not on file  Occupational History  . Not on file  Tobacco Use  . Smoking status: Never Smoker  . Smokeless tobacco: Never Used  Vaping Use  . Vaping Use: Never used  Substance and Sexual Activity  . Alcohol use: No    Alcohol/week: 0.0 standard drinks  . Drug use: No  . Sexual activity: Not on file  Other Topics Concern  .  Not on file  Social History Narrative  . Not on file   Social Determinants of Health   Financial Resource Strain: Low Risk   . Difficulty of Paying Living Expenses: Not hard at all  Food Insecurity: Not on file  Transportation Needs: Not on file  Physical Activity: Not on file  Stress: Stress Concern Present  . Feeling of Stress : To some extent  Social Connections: Not on file    Outpatient Encounter Medications as of 12/22/2020  Medication Sig  . acetaminophen (TYLENOL) 650 MG CR tablet Take 650 mg by mouth daily as needed for pain.  Marland Kitchen amLODipine (NORVASC) 5 MG tablet TAKE 1 TABLET BY MOUTH  TWICE DAILY  . aspirin EC 81 MG tablet Take 81 mg by mouth daily. Swallow  whole.  . blood glucose meter kit and supplies Dispense based on patient and insurance preference. Use up to four times daily as directed. (FOR ICD-10 E10.9, E11.9).  Marland Kitchen Cholecalciferol (VITAMIN D-3) 1000 UNITS CAPS Take 1 capsule by mouth daily.  . cloNIDine (CATAPRES) 0.1 MG tablet TAKE 1 TABLET BY MOUTH 3  TIMES DAILY  . colchicine 0.6 MG tablet Take 1 tablet (0.6 mg total) by mouth 2 (two) times daily as needed.  Marland Kitchen glucosamine-chondroitin 500-400 MG tablet Take 1 tablet by mouth 3 (three) times daily.  Marland Kitchen glucose blood test strip Use as instructed to check blood sugars twice daily. Dx E11.9  . JARDIANCE 10 MG TABS tablet TAKE 1 TABLET BY MOUTH  DAILY BEFORE BREAKFAST  . Lancets (ONETOUCH ULTRASOFT) lancets Use as instructed to check blood sugars once daily. Dx E11.9  . levothyroxine (SYNTHROID) 100 MCG tablet TAKE 1 TABLET BY MOUTH  DAILY  . olopatadine (PATADAY) 0.1 % ophthalmic solution 1 drop 2 (two) times daily as needed for allergies.  Marland Kitchen quinapril (ACCUPRIL) 40 MG tablet TAKE 1 TABLET BY MOUTH  TWICE DAILY  . ROCKLATAN 0.02-0.005 % SOLN Apply 1 drop to eye at bedtime.  . rosuvastatin (CRESTOR) 20 MG tablet TAKE 1 TABLET(20 MG) BY MOUTH DAILY  . spironolactone-hydrochlorothiazide (ALDACTAZIDE) 25-25 MG tablet TAKE 1 TABLET BY MOUTH  DAILY  . timolol (TIMOPTIC) 0.25 % ophthalmic solution Place 1 drop into both eyes 2 (two) times daily.  . Turmeric 500 MG CAPS Take 1 capsule by mouth daily.  . vitamin C (ASCORBIC ACID) 500 MG tablet Take 500 mg by mouth daily.  . vitamin E 200 UNIT capsule Take 200 Units by mouth daily.   No facility-administered encounter medications on file as of 12/22/2020.    Review of Systems  Constitutional: Negative for appetite change and unexpected weight change.  HENT: Negative for congestion and sinus pressure.   Respiratory: Negative for cough, chest tightness and shortness of breath.   Cardiovascular: Negative for chest pain, palpitations and leg swelling.   Gastrointestinal: Negative for abdominal pain, diarrhea, nausea and vomiting.  Genitourinary: Negative for difficulty urinating and dysuria.  Musculoskeletal: Negative for joint swelling and myalgias.       Recent gout flare.   Skin: Negative for color change and rash.  Neurological: Negative for dizziness, light-headedness and headaches.  Psychiatric/Behavioral: Negative for agitation and dysphoric mood.       Objective:    Physical Exam Vitals reviewed.  Constitutional:      General: She is not in acute distress.    Appearance: Normal appearance.  HENT:     Head: Normocephalic and atraumatic.     Right Ear: External ear normal.  Left Ear: External ear normal.  Eyes:     General: No scleral icterus.       Right eye: No discharge.        Left eye: No discharge.     Conjunctiva/sclera: Conjunctivae normal.  Neck:     Thyroid: No thyromegaly.  Cardiovascular:     Rate and Rhythm: Normal rate and regular rhythm.  Pulmonary:     Effort: No respiratory distress.     Breath sounds: Normal breath sounds. No wheezing.  Abdominal:     General: Bowel sounds are normal.     Palpations: Abdomen is soft.     Tenderness: There is no abdominal tenderness.  Musculoskeletal:        General: No swelling or tenderness.     Cervical back: Neck supple. No tenderness.  Lymphadenopathy:     Cervical: No cervical adenopathy.  Skin:    Findings: No erythema or rash.  Neurological:     Mental Status: She is alert.  Psychiatric:        Mood and Affect: Mood normal.        Behavior: Behavior normal.     BP 128/74   Pulse 70   Temp (!) 96.5 F (35.8 C) (Temporal)   Resp 16   Ht 5' (1.524 m)   Wt 202 lb (91.6 kg)   LMP 08/11/1995   SpO2 98%   BMI 39.45 kg/m  Wt Readings from Last 3 Encounters:  12/22/20 202 lb (91.6 kg)  08/31/20 200 lb (90.7 kg)  05/08/20 202 lb (91.6 kg)     Lab Results  Component Value Date   WBC 6.7 05/01/2020   HGB 15.0 05/01/2020   HCT 43.4  05/01/2020   PLT 267 05/01/2020   GLUCOSE 119 (H) 12/23/2020   CHOL 146 12/23/2020   TRIG 200 (H) 12/23/2020   HDL 41 12/23/2020   LDLCALC 72 12/23/2020   ALT 17 12/23/2020   AST 21 12/23/2020   NA 134 12/23/2020   K 4.9 12/23/2020   CL 93 (L) 12/23/2020   CREATININE 1.29 (H) 12/23/2020   BUN 28 (H) 12/23/2020   CO2 27 12/23/2020   TSH 0.885 09/02/2020   INR 1.00 07/24/2018   HGBA1C 7.6 (H) 12/23/2020    MM 3D SCREEN BREAST BILATERAL  Result Date: 04/14/2020 CLINICAL DATA:  Screening. EXAM: DIGITAL SCREENING BILATERAL MAMMOGRAM WITH TOMO AND CAD COMPARISON:  Previous exam(s). ACR Breast Density Category a: The breast tissue is almost entirely fatty. FINDINGS: There are no findings suspicious for malignancy. Images were processed with CAD. IMPRESSION: No mammographic evidence of malignancy. A result letter of this screening mammogram will be mailed directly to the patient. RECOMMENDATION: Screening mammogram in one year. (Code:SM-B-01Y) BI-RADS CATEGORY  1: Negative. Electronically Signed   By: Ammie Ferrier M.D.   On: 04/14/2020 15:19       Assessment & Plan:   Problem List Items Addressed This Visit    BMI 39.0-39.9,adult    Discussed diet, exercise and weight loss.  Follow.       CKD (chronic kidney disease), stage III ([place])    Continue to avoid antiinflammatories.  Continue ace inhibitor.  Follow metabolic panel.       Gout    Recent flare.  Discussed stopping hctz.  She declines at this time. Will adjust diet.  Follow.  Discuss allopurinol.        Relevant Orders   Uric acid (Completed)   Hypercholesterolemia    Continue pravastatin.  Low cholesterol diet and exercise.  Follow lipid panel and liver function tests.        Relevant Orders   Hepatic function panel (Completed)   Lipid panel (Completed)   Hypertension - Primary    Continue amlodipine, quinapril and spironolactone/hctz. She desires not to stop hctz.  Blood pressure as outlined.  Follow pressures.   Follow metabolic panel.       Hypothyroidism    On thyroid replacement.  Follow tsh.       Stress    Overall appears to be handling things well.  Follow.       Type 2 diabetes mellitus with hyperglycemia ([facility])    Continue jardiance.  Low carb diet and exercise.  Follow met b and a1c.       Relevant Orders   Hemoglobin A1c (Completed)   Basic metabolic panel (Completed)       Einar Pheasant, MD

## 2020-12-24 LAB — HEPATIC FUNCTION PANEL
ALT: 17 IU/L (ref 0–32)
AST: 21 IU/L (ref 0–40)
Albumin: 4.8 g/dL — ABNORMAL HIGH (ref 3.7–4.7)
Alkaline Phosphatase: 52 IU/L (ref 44–121)
Bilirubin Total: 0.8 mg/dL (ref 0.0–1.2)
Bilirubin, Direct: 0.19 mg/dL (ref 0.00–0.40)
Total Protein: 7.8 g/dL (ref 6.0–8.5)

## 2020-12-24 LAB — LIPID PANEL
Chol/HDL Ratio: 3.6 ratio (ref 0.0–4.4)
Cholesterol, Total: 146 mg/dL (ref 100–199)
HDL: 41 mg/dL (ref >39)
LDL Chol Calc (NIH): 72 mg/dL (ref 0–99)
Triglycerides: 200 mg/dL — ABNORMAL HIGH (ref 0–149)
VLDL Cholesterol Cal: 33 mg/dL (ref 5–40)

## 2020-12-24 LAB — URIC ACID: Uric Acid: 6.3 mg/dL (ref 3.1–7.9)

## 2020-12-24 LAB — BASIC METABOLIC PANEL
BUN/Creatinine Ratio: 22 (ref 12–28)
BUN: 28 mg/dL — ABNORMAL HIGH (ref 8–27)
CO2: 27 mmol/L (ref 20–29)
Calcium: 10.3 mg/dL (ref 8.7–10.3)
Chloride: 93 mmol/L — ABNORMAL LOW (ref 96–106)
Creatinine, Ser: 1.29 mg/dL — ABNORMAL HIGH (ref 0.57–1.00)
Glucose: 119 mg/dL — ABNORMAL HIGH (ref 65–99)
Potassium: 4.9 mmol/L (ref 3.5–5.2)
Sodium: 134 mmol/L (ref 134–144)
eGFR: 42 mL/min/{1.73_m2} — ABNORMAL LOW (ref >59)

## 2020-12-24 LAB — HEMOGLOBIN A1C
Est. average glucose Bld gHb Est-mCnc: 171 mg/dL
Hgb A1c MFr Bld: 7.6 % — ABNORMAL HIGH (ref 4.8–5.6)

## 2020-12-27 ENCOUNTER — Encounter: Payer: Self-pay | Admitting: Internal Medicine

## 2020-12-27 NOTE — Assessment & Plan Note (Signed)
Continue to avoid antiinflammatories.  Continue ace inhibitor.  Follow metabolic panel.

## 2020-12-27 NOTE — Assessment & Plan Note (Signed)
Overall appears to be handling things well.  Follow.  ?

## 2020-12-27 NOTE — Assessment & Plan Note (Signed)
Discussed diet, exercise and weight loss.  Follow.    

## 2020-12-27 NOTE — Assessment & Plan Note (Addendum)
Recent flare.  Discussed stopping hctz.  She declines at this time. Will adjust diet.  Follow.  Discuss allopurinol.

## 2020-12-27 NOTE — Assessment & Plan Note (Signed)
On thyroid replacement.  Follow tsh.  

## 2020-12-27 NOTE — Assessment & Plan Note (Signed)
Continue pravastatin.  Low cholesterol diet and exercise.  Follow lipid panel and liver function tests.   

## 2020-12-27 NOTE — Assessment & Plan Note (Signed)
Continue jardiance.  Low carb diet and exercise.  Follow met b and a1c.

## 2020-12-27 NOTE — Assessment & Plan Note (Signed)
Continue amlodipine, quinapril and spironolactone/hctz. She desires not to stop hctz.  Blood pressure as outlined.  Follow pressures.  Follow metabolic panel.

## 2021-01-14 ENCOUNTER — Ambulatory Visit (INDEPENDENT_AMBULATORY_CARE_PROVIDER_SITE_OTHER): Payer: 59 | Admitting: Pharmacist

## 2021-01-14 DIAGNOSIS — E039 Hypothyroidism, unspecified: Secondary | ICD-10-CM

## 2021-01-14 DIAGNOSIS — N183 Chronic kidney disease, stage 3 unspecified: Secondary | ICD-10-CM

## 2021-01-14 DIAGNOSIS — I10 Essential (primary) hypertension: Secondary | ICD-10-CM

## 2021-01-14 DIAGNOSIS — E1165 Type 2 diabetes mellitus with hyperglycemia: Secondary | ICD-10-CM | POA: Diagnosis not present

## 2021-01-14 DIAGNOSIS — E7800 Pure hypercholesterolemia, unspecified: Secondary | ICD-10-CM | POA: Diagnosis not present

## 2021-01-14 NOTE — Patient Instructions (Addendum)
Lorraine Foley,   It was great talking with you today!  Our goal A1c is <7%. This corresponds with fasting sugars <130 and 2 hour after meal sugars <180. I absolutely agree that increasing physical activity can have a fantastic benefit on your blood sugars.   Talk to your HR department to understand what options will be regarding changing from your employer plan to a Medicare plan. It can be complex choosing a Medicare plan. A group that I always recommend for my patients is called the [facility] Information Program [facility]). (JudoChat.com.ee , or just google "[place] SHIIP" ) They have good information on their website regarding navigating Medicare. They also have in person volunteer counselors that can help evaluate options in a non-biased way. The [facility] team is at the [facility] [address] [place], [place], [place] [address], [phone]). You can call and set up an appointment to meet with someone to navigate Medicare insurance.   I've also included a handout regarding the appropriate way to check blood pressure.   Take care, and call us with any questions!  Catie Feliz Beam, PharmD [phone]  Visit Information  PATIENT GOALS: Goals Addressed              This Visit's Progress     Patient Stated   .  Medication Monitoring (pt-stated)        Patient Goals/Self-Care Activities . Over the next 90 days, patient will:  - take medications as prescribed check glucose daily, document, and provide at future appointments -focus on increasing physical activity       The patient verbalized understanding of instructions, educational materials, and care plan provided today and agreed to receive a mailed copy of patient instructions, educational materials, and care plan.    Plan: Telephone follow up appointment with care management team member scheduled for:  ~ 10  weeks  Catie Feliz Beam, PharmD, [place], CPP Clinical Pharmacist [employer] at [employer] [phone]

## 2021-01-14 NOTE — Chronic Care Management (AMB) (Signed)
Chronic Care Management Pharmacy Note  01/14/2021 Name:  Lorraine Foley MRN:  [id number] DOB:  Aug 06, 1941  Subjective: Lorraine Foley is an 80 y.o. year old female who is a primary patient of Einar Pheasant, MD.  The CCM team was consulted for assistance with disease management and care coordination needs.    Engaged with patient by telephone for follow up visit in response to provider referral for pharmacy case management and/or care coordination services.   Consent to Services:  The patient was given information about Chronic Care Management services, agreed to services, and gave verbal consent prior to initiation of services.  Please see initial visit note for detailed documentation.   Patient Care Team: Einar Pheasant, MD as PCP - General (Internal Medicine) De Hollingshead, RPH-CPP (Pharmacist)  Recent office visits:  4/26 - PCP, recent gout flare, declines to stop HCTZ - uric acid at goal; A1c 7.6%, LDL 72, eGFR 42  Recent consult visits: None recently  Hospital visits: None in previous 6 months  Objective:  Lab Results  Component Value Date   CREATININE 1.29 (H) 12/23/2020   CREATININE 1.20 (H) 09/02/2020   CREATININE 1.21 (H) 05/01/2020    Lab Results  Component Value Date   HGBA1C 7.6 (H) 12/23/2020   Last diabetic Eye exam:  Lab Results  Component Value Date/Time   HMDIABEYEEXA No Retinopathy 07/31/2020 12:00 AM    Last diabetic Foot exam:  Lab Results  Component Value Date/Time   HMDIABFOOTEX on my exam.  12/22/2020 12:00 AM        Component Value Date/Time   CHOL 146 12/23/2020 1007   TRIG 200 (H) 12/23/2020 1007   HDL 41 12/23/2020 1007   CHOLHDL 3.6 12/23/2020 1007   CHOLHDL 5.6 08/19/2019 1007   VLDL 49 (H) 08/19/2019 1007   LDLCALC 72 12/23/2020 1007    Hepatic Function Latest Ref Rng & Units 12/23/2020 09/02/2020 05/01/2020  Total Protein 6.0 - 8.5 g/dL 7.8 7.2 7.4  Albumin 3.7 - 4.7 g/dL 4.8(H) 4.5 4.9(H)  AST 0 - 40 IU/L $Remov'21 15 15   'ZrgefI$ ALT 0 - 32 IU/L $Remov'17 17 14  'xhezju$ Alk Phosphatase 44 - 121 IU/L 52 57 55  Total Bilirubin 0.0 - 1.2 mg/dL 0.8 0.7 0.7  Bilirubin, Direct 0.00 - 0.40 mg/dL 0.19 0.17 0.17    Lab Results  Component Value Date/Time   TSH 0.885 09/02/2020 10:53 AM   TSH 1.040 12/25/2019 09:42 AM    CBC Latest Ref Rng & Units 05/01/2020 04/29/2019 07/24/2018  WBC 3.4 - 10.8 x10E3/uL 6.7 5.8 6.7  Hemoglobin 11.1 - 15.9 g/dL 15.0 14.1 13.6  Hematocrit 34.0 - 46.6 % 43.4 40.3 40.0  Platelets 150 - 450 x10E3/uL 267 250 261    Lab Results  Component Value Date/Time   VD25OH 33.3 05/01/2020 09:50 AM   VD25OH 32.9 08/18/2017 10:44 AM    Clinical ASCVD: No    Social History   Tobacco Use  Smoking Status Never Smoker  Smokeless Tobacco Never Used   BP Readings from Last 3 Encounters:  12/22/20 128/74  08/31/20 138/80  05/09/20 (!) 148/68   Pulse Readings from Last 3 Encounters:  12/22/20 70  05/08/20 69  04/29/20 (!) 50   Wt Readings from Last 3 Encounters:  12/22/20 202 lb (91.6 kg)  08/31/20 200 lb (90.7 kg)  05/08/20 202 lb (91.6 kg)    Assessment: Review of patient past medical history, allergies, medications, health status, including review of consultants reports, laboratory  and other test data, was performed as part of comprehensive evaluation and provision of chronic care management services.   SDOH:  (Social Determinants of Health) assessments and interventions performed:  SDOH Interventions   Flowsheet Row Most Recent Value  SDOH Interventions   Financial Strain Interventions Intervention Not Indicated      CCM Care Plan  Allergies  Allergen Reactions  . Metformin And Related Other (See Comments)    Swelling of the lips    Medications Reviewed Today    Reviewed by De Hollingshead, RPH-CPP (Pharmacist) on 01/14/21 at [place] List Status: <None>  Medication Order Taking? Sig Documenting Provider Last Dose Status Informant  acetaminophen (TYLENOL) 650 MG CR tablet [id number]  Yes Take 650 mg by mouth daily as needed for pain. [provider] Taking Active   amLODipine (NORVASC) 5 MG tablet [id number] Yes TAKE 1 TABLET BY MOUTH  TWICE DAILY Einar Pheasant, MD Taking Active   aspirin EC 81 MG tablet [id number] Yes Take 81 mg by mouth daily. Swallow whole. [provider] Taking Active   blood glucose meter kit and supplies [id number] Yes Dispense based on patient and insurance preference. Use up to four times daily as directed. (FOR ICD-10 E10.9, E11.9). Einar Pheasant, MD Taking Active   Cholecalciferol (VITAMIN D-3) 1000 UNITS CAPS [id number] Yes Take 1 capsule by mouth daily. [provider] Taking Active Self  cloNIDine (CATAPRES) 0.1 MG tablet [id number] Yes TAKE 1 TABLET BY MOUTH 3  TIMES DAILY Einar Pheasant, MD Taking Active   colchicine 0.6 MG tablet [id number] No Take 1 tablet (0.6 mg total) by mouth 2 (two) times daily as needed.  Patient not taking: Reported on 01/14/2021   Einar Pheasant, MD Not Taking Active   glucosamine-chondroitin 500-400 MG tablet [id number] Yes Take 1 tablet by mouth 3 (three) times daily. [provider] Taking Active   glucose blood test strip [id number] Yes Use as instructed to check blood sugars twice daily. Dx E11.9 Einar Pheasant, MD Taking Active   JARDIANCE 10 MG TABS tablet [id number] Yes TAKE 1 TABLET BY MOUTH  DAILY BEFORE Laveda Norman, MD Taking Active   Lancets [facility] ULTRASOFT) lancets [id number] Yes Use as instructed to check blood sugars once daily. Dx E11.9 Einar Pheasant, MD Taking Active Self  levothyroxine (SYNTHROID) 100 MCG tablet [id number] Yes TAKE 1 TABLET BY MOUTH  DAILY Einar Pheasant, MD Taking Active   olopatadine (PATANOL) 0.1 % ophthalmic solution [id number] Yes 1 drop 2 (two) times daily as needed for allergies. [provider] Taking Active   quinapril (ACCUPRIL) 40 MG tablet [id number] Yes TAKE 1 TABLET BY MOUTH  TWICE DAILY Einar Pheasant, MD Taking  Active   ROCKLATAN 0.02-0.005 % SOLN [id number] Yes Apply 1 drop to eye at bedtime. [provider] Taking Active   rosuvastatin (CRESTOR) 20 MG tablet [id number] Yes TAKE 1 TABLET(20 MG) BY MOUTH DAILY Einar Pheasant, MD Taking Active   spironolactone-hydrochlorothiazide (ALDACTAZIDE) 25-25 MG tablet [id number] Yes TAKE 1 TABLET BY MOUTH  DAILY Einar Pheasant, MD Taking Active   timolol (TIMOPTIC) 0.25 % ophthalmic solution [id number] Yes Place 1 drop into both eyes 2 (two) times daily. [provider] Taking Active   Turmeric 500 MG CAPS [id number] Yes Take 1 capsule by mouth daily. [provider] Taking Active Self  vitamin C (ASCORBIC ACID) 500 MG tablet [id number] Yes Take 500 mg by mouth daily. [provider] Taking Active Self  vitamin E 200 UNIT capsule [id number] Yes Take 200  Units by mouth daily. [provider] Taking Active   Med List Note Leeanne Rio, [facility] 06/24/19 1528): [facility]          Patient Active Problem List   Diagnosis Date Noted  . Gout 05/09/2020  . CKD (chronic kidney disease), stage III ([place]) 09/28/2019  . Stress 08/17/2019  . Healthcare maintenance 09/28/2018  . 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 Vitamin D deficiency 04/04/2017  . Primary osteoarthritis of left knee 01/31/2016  . 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 Renal insufficiency 11/30/2014  . BMI 39.0-39.9,adult 08/03/2014  . Bradycardia 04/13/2013  . Diabetes mellitus ([place]) 08/10/2012  . Hypertension 08/10/2012  . Hypercholesterolemia 08/10/2012  . Glaucoma 08/10/2012  . GERD (gastroesophageal reflux disease) 08/10/2012  . Hypothyroidism 08/10/2012  . Type 2 diabetes mellitus with hyperglycemia ([place]) 08/10/2012    Immunization History  Administered Date(s) Administered  . Fluad Quad(high Dose 65+) 05/13/2019  . Influenza Split 07/14/2014  . Influenza Whole 07/17/2012  . Influenza, High Dose Seasonal PF 06/06/2017,  06/06/2018  . Influenza-Unspecified 06/20/2015, 06/14/2016, 05/16/2020  . PFIZER(Purple Top)SARS-COV-2 Vaccination 09/04/2019, 09/28/2019, 05/27/2020  . Pneumococcal Conjugate-13 11/05/2013  . Pneumococcal Polysaccharide-23 09/26/2011  . Zoster Recombinat (Shingrix) 05/09/2018, 11/01/2018    Conditions to be addressed/monitored: HTN, HLD, DMII and gout  Care Plan : Medication Management  Updates made by De Hollingshead, RPH-CPP since 01/14/2021 12:00 AM    Problem: Diabetes, Hypertension, Hyperlipidemia     Long-Range Goal: Disease Progression Prevention   Start Date: 09/10/2020  This Visit's Progress: On track  Recent Progress: On track  Priority: High  Note:   Current Barriers:  . Unable to achieve control of diabetes   Pharmacist Clinical Goal(s):  Marland Kitchen Over the next 90 days, patient will achieve adherence to monitoring guidelines and medication adherence to achieve therapeutic efficacy . Over the next 90 days, patient will achieve control of diabetes as evidenced by A1c through collaboration with PharmD and provider.   Interventions: . 1:1 collaboration with Einar Pheasant, MD regarding development and update of comprehensive plan of care as evidenced by provider attestation and co-signature . Inter-disciplinary care team collaboration (see longitudinal plan of care) . Comprehensive medication review performed; medication list updated in electronic medical record  Health Maintenance: . Due for Tdap  SDOH: . Unsure if/when she will transition to [employer]. Encouraged to discuss with her HR department.   Diabetes: . Uncontrolled but improved; current treatment: Jardiance 10 mg daily o Hx metformin  - GI upset (re-trial also limited by renal function) . Current glucose readings: 140-170s, post prandials 150-210s, pending what she ate  . Current exercise patterns: nothing right now, but plans to retire in July.  . Discussed increasing Jardiance dose, however,  patient plans to significantly increase physical activity and improve focus on diet when she retires in ~ 6 weeks. Appropriate to defer changes until after those modifications have been made.  . Continue current regimen.   Hypertension: . Controlled per home readings and per last clinic reading; current treatment: spironolactone/HCTZ 25/25 mg daily, quinapril 40 mg BID, amlodipine 5 mg BID, clonidine 0.1 mg TID . Current blood pressure readings: ~120-140s/80s . Recommended to continue current regimen at this time. Consider HCTZ dose reduction, possible increase in spironolactone dose moving forward if subsequent gout flares, given uric acid >6.   Hyperlipidemia: . Controlled per LDL goal <70; current treatment: rosuvastatin 20 mg daily  . Antiplatelet regimen: aspirin 81 mg daily  . Current dietary  patterns: focuses on reducing fats/fatty meals, mostly eating vegetables and fruits. Plan to increase physical activity moving forward.  . Recommended to continue current regimen at this time.  Gout: . Uric acid slightly above goal <6; recent gout flare prior to last PCP visit . Discussed goal uric acid. If future flare moving forward, consider dose reduction/elimination of HCTZ given impact on uric acid levels. Could trial increased spironolactone dose to compensate.   Hypothyroidism: . Controlled per last TSH; current treatment: levothyroxine 100 mcg daily . Confirmed appropriate administration . Recommended to continue current regimen at this time  Supplementation: Marland Kitchen Vitamin D 1000 units daily, Vitamin E 200 units daily, Vitamin C 500 mg daily, turmeric daily, glucosamine-chondroitin daily . Recommended to continue current regimen at this time  Patient Goals/Self-Care Activities . Over the next 90 days, patient will:  - take medications as prescribed check glucose daily, document, and provide at future appointments -focus on increasing physical activity  Follow Up Plan: Telephone follow  up appointment with care management team member scheduled for: ~ 10 weeks     Medication Assistance: None required.  Patient affirms current coverage meets needs.  Patient's preferred pharmacy is:  [facility], [address], [address] [place], [place] [address] Phone: [phone] Fax: [facility] #[id number] [place], [place] - [address] [address] Phone: [phone] Fax: [phone]    Follow Up:  Patient agrees to Care Plan and Follow-up.  Plan: Telephone follow up appointment with care management team member scheduled for:  ~ 10 weeks  Catie Darnelle Maffucci, PharmD, [address], [place] Clinical Pharmacist [employer] at [employer] [phone]

## 2021-02-14 ENCOUNTER — Other Ambulatory Visit: Payer: Self-pay | Admitting: Internal Medicine

## 2021-03-22 ENCOUNTER — Ambulatory Visit: Payer: 59 | Admitting: Internal Medicine

## 2021-03-26 ENCOUNTER — Telehealth: Payer: Self-pay

## 2021-03-26 NOTE — Telephone Encounter (Signed)
LM letting patient know her sugar readings she mailed in are ok. Advised to call with questions.

## 2021-04-13 ENCOUNTER — Telehealth: Payer: 59

## 2021-04-16 ENCOUNTER — Other Ambulatory Visit: Payer: Self-pay | Admitting: Internal Medicine

## 2021-05-28 DIAGNOSIS — H401112 Primary open-angle glaucoma, right eye, moderate stage: Secondary | ICD-10-CM | POA: Diagnosis not present

## 2021-06-01 ENCOUNTER — Ambulatory Visit (INDEPENDENT_AMBULATORY_CARE_PROVIDER_SITE_OTHER): Payer: Medicare Other | Admitting: Internal Medicine

## 2021-06-01 ENCOUNTER — Other Ambulatory Visit: Payer: Self-pay

## 2021-06-01 VITALS — BP 128/70 | HR 60 | Temp 97.9°F | Resp 16 | Ht 60.0 in | Wt 193.4 lb

## 2021-06-01 DIAGNOSIS — Z1231 Encounter for screening mammogram for malignant neoplasm of breast: Secondary | ICD-10-CM | POA: Diagnosis not present

## 2021-06-01 DIAGNOSIS — I10 Essential (primary) hypertension: Secondary | ICD-10-CM | POA: Diagnosis not present

## 2021-06-01 DIAGNOSIS — E039 Hypothyroidism, unspecified: Secondary | ICD-10-CM

## 2021-06-01 DIAGNOSIS — F439 Reaction to severe stress, unspecified: Secondary | ICD-10-CM

## 2021-06-01 DIAGNOSIS — N1832 Chronic kidney disease, stage 3b: Secondary | ICD-10-CM

## 2021-06-01 DIAGNOSIS — E7800 Pure hypercholesterolemia, unspecified: Secondary | ICD-10-CM

## 2021-06-01 DIAGNOSIS — E1165 Type 2 diabetes mellitus with hyperglycemia: Secondary | ICD-10-CM | POA: Diagnosis not present

## 2021-06-01 LAB — HEPATIC FUNCTION PANEL
ALT: 11 U/L (ref 0–35)
AST: 15 U/L (ref 0–37)
Albumin: 4.3 g/dL (ref 3.5–5.2)
Alkaline Phosphatase: 48 U/L (ref 39–117)
Bilirubin, Direct: 0.2 mg/dL (ref 0.0–0.3)
Total Bilirubin: 0.8 mg/dL (ref 0.2–1.2)
Total Protein: 7.1 g/dL (ref 6.0–8.3)

## 2021-06-01 LAB — CBC WITH DIFFERENTIAL/PLATELET
Basophils Absolute: 0 10*3/uL (ref 0.0–0.1)
Basophils Relative: 0.5 % (ref 0.0–3.0)
Eosinophils Absolute: 0.2 10*3/uL (ref 0.0–0.7)
Eosinophils Relative: 2.6 % (ref 0.0–5.0)
HCT: 39.1 % (ref 36.0–46.0)
Hemoglobin: 13.3 g/dL (ref 12.0–15.0)
Lymphocytes Relative: 14.6 % (ref 12.0–46.0)
Lymphs Abs: 1.4 10*3/uL (ref 0.7–4.0)
MCHC: 33.9 g/dL (ref 30.0–36.0)
MCV: 87.7 fl (ref 78.0–100.0)
Monocytes Absolute: 0.8 10*3/uL (ref 0.1–1.0)
Monocytes Relative: 8.8 % (ref 3.0–12.0)
Neutro Abs: 6.9 10*3/uL (ref 1.4–7.7)
Neutrophils Relative %: 73.5 % (ref 43.0–77.0)
Platelets: 239 10*3/uL (ref 150.0–400.0)
RBC: 4.46 Mil/uL (ref 3.87–5.11)
RDW: 12.9 % (ref 11.5–15.5)
WBC: 9.3 10*3/uL (ref 4.0–10.5)

## 2021-06-01 LAB — BASIC METABOLIC PANEL
BUN: 42 mg/dL — ABNORMAL HIGH (ref 6–23)
CO2: 33 mEq/L — ABNORMAL HIGH (ref 19–32)
Calcium: 11.3 mg/dL — ABNORMAL HIGH (ref 8.4–10.5)
Chloride: 101 mEq/L (ref 96–112)
Creatinine, Ser: 1.39 mg/dL — ABNORMAL HIGH (ref 0.40–1.20)
GFR: 35.87 mL/min — ABNORMAL LOW (ref >60.00)
Glucose, Bld: 138 mg/dL — ABNORMAL HIGH (ref 70–99)
Potassium: 5.2 mEq/L — ABNORMAL HIGH (ref 3.5–5.1)
Sodium: 141 mEq/L (ref 135–145)

## 2021-06-01 LAB — LIPID PANEL
Cholesterol: 130 mg/dL (ref 0–200)
HDL: 44.5 mg/dL (ref >39.00)
LDL Cholesterol: 51 mg/dL (ref 0–99)
NonHDL: 85.53
Total CHOL/HDL Ratio: 3
Triglycerides: 172 mg/dL — ABNORMAL HIGH (ref 0.0–149.0)
VLDL: 34.4 mg/dL (ref 0.0–40.0)

## 2021-06-01 LAB — MICROALBUMIN / CREATININE URINE RATIO
Creatinine,U: 58.2 mg/dL
Microalb Creat Ratio: 2.6 mg/g (ref 0.0–30.0)
Microalb, Ur: 1.5 mg/dL (ref 0.0–1.9)

## 2021-06-01 LAB — HEMOGLOBIN A1C: Hgb A1c MFr Bld: 6.9 % — ABNORMAL HIGH (ref 4.6–6.5)

## 2021-06-01 LAB — TSH: TSH: 1.35 u[IU]/mL (ref 0.35–5.50)

## 2021-06-01 MED ORDER — SPIRONOLACTONE-HCTZ 25-25 MG PO TABS: 1.0000 | ORAL_TABLET | Freq: Every day | ORAL | 3 refills | Status: DC

## 2021-06-01 MED ORDER — EMPAGLIFLOZIN 10 MG PO TABS: 10.0000 mg | ORAL_TABLET | Freq: Every day | ORAL | 3 refills | Status: DC

## 2021-06-01 MED ORDER — LEVOTHYROXINE SODIUM 100 MCG PO TABS: 100.0000 ug | ORAL_TABLET | Freq: Every day | ORAL | 3 refills | Status: DC

## 2021-06-01 MED ORDER — CLONIDINE HCL 0.1 MG PO TABS: 0.1000 mg | ORAL_TABLET | Freq: Three times a day (TID) | ORAL | 3 refills | Status: DC

## 2021-06-01 MED ORDER — GLUCOSE BLOOD VI STRP: ORAL_STRIP | 3 refills | Status: DC

## 2021-06-01 NOTE — Progress Notes (Signed)
Patient ID: Lorraine Foley, female   DOB: June 05, 1941, 80 y.o.   MRN: [id number]   Subjective:    Patient ID: Lorraine Foley, female    DOB: Jul 24, 1941, 80 y.o.   MRN: [id number]  This visit occurred during the SARS-CoV-2 public health emergency.  Safety protocols were in place, including screening questions prior to the visit, additional usage of staff PPE, and extensive cleaning of exam room while observing appropriate contact time as indicated for disinfecting solutions.   Patient here for a scheduled follow up.   Chief Complaint  Patient presents with   Hypertension   Diabetes   Hyperlipidemia   .   Hypertension Pertinent negatives include no chest pain, headaches, palpitations or shortness of breath.  Diabetes Pertinent negatives for hypoglycemia include no dizziness or headaches. Pertinent negatives for diabetes include no chest pain.  Hyperlipidemia Pertinent negatives include no chest pain, myalgias or shortness of breath.  Recently visited her daughter - [place] - 2 months.  Has retired.  Doing well.  Feels better.  Has been more active.  Reviewed outside blood sugars.  Blood sugars improved - most readings am;  120-150s and pm 120-170.  No chest pain or sob reported.  No cough or congestion.  No abdominal pain.  Bowels doing better.  Not eating as much peanut butter.     Past Medical History:  Diagnosis Date   Arthritis    knees   Diabetes mellitus (HCC)    GERD (gastroesophageal reflux disease)    Glaucoma    Hypercholesterolemia    Hypertension    Hypothyroidism    Pneumonia    Past Surgical History:  Procedure Laterality Date   CATARACT EXTRACTION W/PHACO Left 11/09/2016   Procedure: CATARACT EXTRACTION PHACO AND INTRAOCULAR LENS PLACEMENT ([place])  left diabetic complicated;  Surgeon: Leandrew Koyanagi, MD;  Location: [place];  Service: Ophthalmology;  Laterality: Left;  Diabetic - oral meds   CATARACT EXTRACTION W/PHACO Right 03/08/2017   Procedure:  CATARACT EXTRACTION PHACO AND INTRAOCULAR LENS PLACEMENT ([place])  Right Diabetic complicated;  Surgeon: Leandrew Koyanagi, MD;  Location: [place];  Service: Ophthalmology;  Laterality: Right;   EYE SURGERY Bilateral 08/2014, 2.2016   laser surgery in preparation for glaucoma surgery   KNEE ARTHROPLASTY Left 08/06/2018   Procedure: COMPUTER ASSISTED TOTAL KNEE ARTHROPLASTY;  Surgeon: Dereck Leep, MD;  Location: [facility];  Service: Orthopedics;  Laterality: Left;   TOTAL HIP ARTHROPLASTY  5/13   right   Family History  Problem Relation Age of Onset   Mental illness Father        suicide   Liver disease Father        alcohol   Glaucoma Father    Alcohol abuse Father    Blindness Father    Glaucoma Mother    Osteoporosis Mother    Diabetes Sister    Breast cancer Sister 43   Colon cancer Neg Hx    Social History   Socioeconomic History   Marital status: Divorced    Spouse name: Not on file   Number of children: 1   Years of education: Not on file   Highest education level: Not on file  Occupational History   Not on file  Tobacco Use   Smoking status: Never   Smokeless tobacco: Never  Vaping Use   Vaping Use: Never used  Substance and Sexual Activity   Alcohol use: No    Alcohol/week: 0.0 standard drinks   Drug use:  No   Sexual activity: Not on file  Other Topics Concern   Not on file  Social History Narrative   Not on file   Social Determinants of Health   Financial Resource Strain: Low Risk    Difficulty of Paying Living Expenses: Not hard at all  Food Insecurity: Not on file  Transportation Needs: Not on file  Physical Activity: Not on file  Stress: Stress Concern Present   Feeling of Stress : To some extent  Social Connections: Not on file     Review of Systems  Constitutional:  Negative for appetite change and unexpected weight change.       Has lost weight.  Has been more active.  Diet changed.    HENT:  Negative for congestion and sinus  pressure.   Respiratory:  Negative for cough, chest tightness and shortness of breath.   Cardiovascular:  Negative for chest pain, palpitations and leg swelling.  Gastrointestinal:  Negative for abdominal pain, diarrhea, nausea and vomiting.  Genitourinary:  Negative for difficulty urinating and dysuria.  Musculoskeletal:  Negative for joint swelling and myalgias.  Skin:  Negative for color change and rash.  Neurological:  Negative for dizziness, light-headedness and headaches.  Psychiatric/Behavioral:  Negative for agitation and dysphoric mood.       Objective:     BP 128/70   Pulse 60   Temp 97.9 F (36.6 C)   Resp 16   Ht 5' (1.524 m)   Wt 193 lb 6.4 oz (87.7 kg)   LMP 08/11/1995   SpO2 98%   BMI 37.77 kg/m  Wt Readings from Last 3 Encounters:  06/01/21 193 lb 6.4 oz (87.7 kg)  12/22/20 202 lb (91.6 kg)  08/31/20 200 lb (90.7 kg)    Physical Exam Vitals reviewed.  Constitutional:      General: She is not in acute distress.    Appearance: Normal appearance.  HENT:     Head: Normocephalic and atraumatic.     Right Ear: External ear normal.     Left Ear: External ear normal.  Eyes:     General: No scleral icterus.       Right eye: No discharge.        Left eye: No discharge.     Conjunctiva/sclera: Conjunctivae normal.  Neck:     Thyroid: No thyromegaly.  Cardiovascular:     Rate and Rhythm: Normal rate and regular rhythm.  Pulmonary:     Effort: No respiratory distress.     Breath sounds: Normal breath sounds. No wheezing.  Abdominal:     General: Bowel sounds are normal.     Palpations: Abdomen is soft.     Tenderness: There is no abdominal tenderness.  Musculoskeletal:        General: No swelling or tenderness.     Cervical back: Neck supple. No tenderness.  Lymphadenopathy:     Cervical: No cervical adenopathy.  Skin:    Findings: No erythema or rash.  Neurological:     Mental Status: She is alert.  Psychiatric:        Mood and Affect: Mood  normal.        Behavior: Behavior normal.     Outpatient Encounter Medications as of 06/01/2021  Medication Sig   acetaminophen (TYLENOL) 650 MG CR tablet Take 650 mg by mouth daily as needed for pain.   amLODipine (NORVASC) 5 MG tablet TAKE 1 TABLET BY MOUTH  TWICE DAILY   aspirin EC 81 MG tablet Take  81 mg by mouth daily. Swallow whole.   blood glucose meter kit and supplies Dispense based on patient and insurance preference. Use up to four times daily as directed. (FOR ICD-10 E10.9, E11.9).   Cholecalciferol (VITAMIN D-3) 1000 UNITS CAPS Take 1 capsule by mouth daily.   colchicine 0.6 MG tablet Take 1 tablet (0.6 mg total) by mouth 2 (two) times daily as needed.   glucosamine-chondroitin 500-400 MG tablet Take 1 tablet by mouth 3 (three) times daily.   Lancets (ONETOUCH ULTRASOFT) lancets Use as instructed to check blood sugars once daily. Dx E11.9   olopatadine (PATANOL) 0.1 % ophthalmic solution 1 drop 2 (two) times daily as needed for allergies.   quinapril (ACCUPRIL) 40 MG tablet TAKE 1 TABLET BY MOUTH  TWICE DAILY   ROCKLATAN 0.02-0.005 % SOLN Apply 1 drop to eye at bedtime.   rosuvastatin (CRESTOR) 20 MG tablet TAKE 1 TABLET(20 MG) BY MOUTH DAILY   timolol (TIMOPTIC) 0.25 % ophthalmic solution Place 1 drop into both eyes 2 (two) times daily.   Turmeric 500 MG CAPS Take 1 capsule by mouth daily.   vitamin C (ASCORBIC ACID) 500 MG tablet Take 500 mg by mouth daily.   vitamin E 200 UNIT capsule Take 200 Units by mouth daily.   [DISCONTINUED] cloNIDine (CATAPRES) 0.1 MG tablet TAKE 1 TABLET BY MOUTH 3  TIMES DAILY   [DISCONTINUED] glucose blood test strip Use as instructed to check blood sugars twice daily. Dx E11.9   [DISCONTINUED] JARDIANCE 10 MG TABS tablet TAKE 1 TABLET BY MOUTH  DAILY BEFORE BREAKFAST   [DISCONTINUED] levothyroxine (SYNTHROID) 100 MCG tablet TAKE 1 TABLET BY MOUTH  DAILY   [DISCONTINUED] spironolactone-hydrochlorothiazide (ALDACTAZIDE) 25-25 MG tablet TAKE 1  TABLET BY MOUTH  DAILY   cloNIDine (CATAPRES) 0.1 MG tablet Take 1 tablet (0.1 mg total) by mouth 3 (three) times daily.   empagliflozin (JARDIANCE) 10 MG TABS tablet Take 1 tablet (10 mg total) by mouth daily before breakfast.   glucose blood test strip Use as instructed to check blood sugars twice daily. Dx E11.9   levothyroxine (SYNTHROID) 100 MCG tablet Take 1 tablet (100 mcg total) by mouth daily.   spironolactone-hydrochlorothiazide (ALDACTAZIDE) 25-25 MG tablet Take 1 tablet by mouth daily.   No facility-administered encounter medications on file as of 06/01/2021.     Lab Results  Component Value Date   WBC 9.3 06/01/2021   HGB 13.3 06/01/2021   HCT 39.1 06/01/2021   PLT 239.0 06/01/2021   GLUCOSE 138 (H) 06/01/2021   CHOL 130 06/01/2021   TRIG 172.0 (H) 06/01/2021   HDL 44.50 06/01/2021   LDLCALC 51 06/01/2021   ALT 11 06/01/2021   AST 15 06/01/2021   NA 141 06/01/2021   K 4.8 06/04/2021   CL 101 06/01/2021   CREATININE 1.39 (H) 06/01/2021   BUN 42 (H) 06/01/2021   CO2 33 (H) 06/01/2021   TSH 1.35 06/01/2021   INR 1.00 07/24/2018   HGBA1C 6.9 (H) 06/01/2021   MICROALBUR 1.5 06/01/2021    MM 3D SCREEN BREAST BILATERAL  Result Date: 04/14/2020 CLINICAL DATA:  Screening. EXAM: DIGITAL SCREENING BILATERAL MAMMOGRAM WITH TOMO AND CAD COMPARISON:  Previous exam(s). ACR Breast Density Category a: The breast tissue is almost entirely fatty. FINDINGS: There are no findings suspicious for malignancy. Images were processed with CAD. IMPRESSION: No mammographic evidence of malignancy. A result letter of this screening mammogram will be mailed directly to the patient. RECOMMENDATION: Screening mammogram in one year. (Code:SM-B-01Y) BI-RADS CATEGORY  1: Negative. Electronically Signed   By: Ammie Ferrier M.D.   On: 04/14/2020 15:19       Assessment & Plan:   Problem List Items Addressed This Visit     CKD (chronic kidney disease), stage III ([place])    Continue to avoid  antiinflammatories.  Continue ace inhibitor.  Follow metabolic panel.  GFR 35 Lab Results  Component Value Date   CREATININE 1.39 (H) 06/01/2021       Diabetes mellitus (HCC)   Relevant Medications   empagliflozin (JARDIANCE) 10 MG TABS tablet   glucose blood test strip   Other Relevant Orders   Hemoglobin A1c (Completed)   Basic metabolic panel (Completed)   Microalbumin / creatinine urine ratio (Completed)   Hypercholesterolemia    Continue pravastatin.  Low cholesterol diet and exercise.  Follow lipid panel and liver function tests.        Relevant Medications   cloNIDine (CATAPRES) 0.1 MG tablet   spironolactone-hydrochlorothiazide (ALDACTAZIDE) 25-25 MG tablet   Other Relevant Orders   Hepatic function panel (Completed)   Lipid panel (Completed)   Hypertension    Continue amlodipine, quinapril and spironolactone/hctz.  Blood pressure as outlined.  Follow pressures.  Follow metabolic panel.       Relevant Medications   cloNIDine (CATAPRES) 0.1 MG tablet   spironolactone-hydrochlorothiazide (ALDACTAZIDE) 25-25 MG tablet   Other Relevant Orders   CBC with Differential/Platelet (Completed)   TSH (Completed)   Hypothyroidism    On thyroid replacement.  Follow tsh.       Relevant Medications   levothyroxine (SYNTHROID) 100 MCG tablet   Stress    Has retired.  Doing better.  Follow.       Other Visit Diagnoses     Visit for screening mammogram    -  Primary   Relevant Orders   MM 3D SCREEN BREAST BILATERAL        Einar Pheasant, MD

## 2021-06-03 ENCOUNTER — Other Ambulatory Visit: Payer: Self-pay

## 2021-06-03 ENCOUNTER — Other Ambulatory Visit: Payer: Self-pay | Admitting: Internal Medicine

## 2021-06-03 ENCOUNTER — Telehealth: Payer: Self-pay

## 2021-06-03 DIAGNOSIS — E875 Hyperkalemia: Secondary | ICD-10-CM

## 2021-06-03 NOTE — Progress Notes (Signed)
Order placed for f/u potassium (stat).

## 2021-06-03 NOTE — Telephone Encounter (Signed)
LMTCB regarding lab results.  

## 2021-06-03 NOTE — Telephone Encounter (Signed)
Pt aware of results,

## 2021-06-03 NOTE — Telephone Encounter (Signed)
Patient is returning your call regarding her lab results.

## 2021-06-04 ENCOUNTER — Other Ambulatory Visit
Admission: RE | Admit: 2021-06-04 | Discharge: 2021-06-04 | Disposition: A | Discharge status: Home / Self Care | Payer: Medicare Other | Attending: Internal Medicine | Admitting: Internal Medicine

## 2021-06-04 DIAGNOSIS — E875 Hyperkalemia: Secondary | ICD-10-CM | POA: Diagnosis not present

## 2021-06-04 LAB — POTASSIUM: Potassium: 4.8 mmol/L (ref 3.5–5.1)

## 2021-06-06 ENCOUNTER — Encounter: Payer: Self-pay | Admitting: Internal Medicine

## 2021-06-06 NOTE — Assessment & Plan Note (Signed)
Continue jardiance.  Low carb diet and exercise.  Outside sugars reviewed.  Follow met b and a1c.

## 2021-06-06 NOTE — Assessment & Plan Note (Signed)
Continue pravastatin.  Low cholesterol diet and exercise.  Follow lipid panel and liver function tests.   

## 2021-06-06 NOTE — Assessment & Plan Note (Signed)
Has retired.  Doing better.  Follow.

## 2021-06-06 NOTE — Assessment & Plan Note (Signed)
On thyroid replacement.  Follow tsh.  

## 2021-06-06 NOTE — Assessment & Plan Note (Signed)
Continue amlodipine, quinapril and spironolactone/hctz.  Blood pressure as outlined.  Follow pressures.  Follow metabolic panel.  

## 2021-06-06 NOTE — Assessment & Plan Note (Addendum)
Continue to avoid antiinflammatories.  Continue ace inhibitor.  Follow metabolic panel.  GFR 35 Lab Results  Component Value Date   CREATININE 1.39 (H) 06/01/2021

## 2021-06-16 ENCOUNTER — Ambulatory Visit
Admission: RE | Admit: 2021-06-16 | Discharge: 2021-06-16 | Disposition: A | Discharge status: Home / Self Care | Payer: Medicare Other | Source: Ambulatory Visit | Attending: Internal Medicine | Admitting: Internal Medicine

## 2021-06-16 ENCOUNTER — Other Ambulatory Visit: Payer: Self-pay

## 2021-06-16 DIAGNOSIS — Z1231 Encounter for screening mammogram for malignant neoplasm of breast: Secondary | ICD-10-CM | POA: Diagnosis not present

## 2021-06-22 ENCOUNTER — Telehealth: Payer: Self-pay

## 2021-06-22 NOTE — Chronic Care Management (AMB) (Signed)
  Care Management   Note  06/22/2021 Name: Lorraine Foley MRN: [id number] DOB: 02/28/1941  Lorraine Foley is a 80 y.o. year old female who is a primary care patient of Dale [place], MD and is actively engaged with the care management team. I reached out to [name] by phone today to assist with re-scheduling a follow up visit with the Pharmacist  Follow up plan: Unsuccessful telephone outreach attempt made. A HIPAA compliant phone message was left for the patient providing contact information and requesting a return call.  The care management team will reach out to the patient again over the next 7 days.  If patient returns call to provider office, please advise to call Embedded Care Management Care Guide Penne Lash  at [phone]  Penne Lash, RMA Care Guide, Embedded Care Coordination [facility]  [place], [place] [address] Direct Dial: [phone] [email or web address]: [email or web address]

## 2021-06-22 NOTE — Chronic Care Management (AMB) (Signed)
  Care Management   Note  06/22/2021 Name: Lorraine Foley MRN: [id number] DOB: 1940/11/09  Lorraine Foley is a 80 y.o. year old female who is a primary care patient of Dale [place], MD and is actively engaged with the care management team. I reached out to [name] by phone today to assist with re-scheduling a follow up visit with the Pharmacist  Follow up plan: Patient declines rescheduling  follow up at this time. Appropriate care team members and provider have been notified via electronic communication.   Penne Lash, RMA Care Guide, Embedded Care Coordination [facility]  [place], [place] [address] Direct Dial: [phone] [email or web address]: [email or web address]

## 2021-08-17 ENCOUNTER — Other Ambulatory Visit: Payer: Self-pay | Admitting: Internal Medicine

## 2021-10-04 ENCOUNTER — Ambulatory Visit: Payer: Medicare Other | Admitting: Internal Medicine

## 2021-10-12 DIAGNOSIS — Z96652 Presence of left artificial knee joint: Secondary | ICD-10-CM | POA: Diagnosis not present

## 2021-10-12 DIAGNOSIS — Z96641 Presence of right artificial hip joint: Secondary | ICD-10-CM | POA: Diagnosis not present

## 2021-10-12 DIAGNOSIS — E1165 Type 2 diabetes mellitus with hyperglycemia: Secondary | ICD-10-CM | POA: Diagnosis not present

## 2021-10-26 ENCOUNTER — Ambulatory Visit: Payer: Medicare Other | Admitting: Internal Medicine

## 2021-11-04 ENCOUNTER — Ambulatory Visit: Payer: Self-pay | Admitting: Pharmacist

## 2021-11-04 NOTE — Chronic Care Management (AMB) (Signed)
?  Chronic Care Management  ? ?Note ? ?11/04/2021 ?Name: VANITY DAIGLER MRN: [id number] DOB: Jan 07, 1941 ? ? ? ?Closing pharmacy CCM case at this time.  Patient has clinic contact information for future questions or concerns.  ? ?Catie Darnelle Maffucci, PharmD, [place], CPP ?Clinical Pharmacist ?Therapist, music at [employer] ?[phone] ? ?

## 2021-11-30 DIAGNOSIS — H401121 Primary open-angle glaucoma, left eye, mild stage: Secondary | ICD-10-CM | POA: Diagnosis not present

## 2021-11-30 LAB — HM DIABETES EYE EXAM

## 2022-01-07 ENCOUNTER — Ambulatory Visit (INDEPENDENT_AMBULATORY_CARE_PROVIDER_SITE_OTHER): Payer: Medicare Other | Admitting: Internal Medicine

## 2022-01-07 ENCOUNTER — Encounter: Payer: Self-pay | Admitting: Internal Medicine

## 2022-01-07 VITALS — BP 142/62 | HR 62 | Temp 98.0°F | Ht 60.0 in | Wt 198.8 lb

## 2022-01-07 DIAGNOSIS — E559 Vitamin D deficiency, unspecified: Secondary | ICD-10-CM

## 2022-01-07 DIAGNOSIS — E1165 Type 2 diabetes mellitus with hyperglycemia: Secondary | ICD-10-CM

## 2022-01-07 DIAGNOSIS — Z0000 Encounter for general adult medical examination without abnormal findings: Secondary | ICD-10-CM

## 2022-01-07 DIAGNOSIS — E039 Hypothyroidism, unspecified: Secondary | ICD-10-CM | POA: Diagnosis not present

## 2022-01-07 DIAGNOSIS — E7800 Pure hypercholesterolemia, unspecified: Secondary | ICD-10-CM

## 2022-01-07 DIAGNOSIS — F439 Reaction to severe stress, unspecified: Secondary | ICD-10-CM

## 2022-01-07 DIAGNOSIS — I10 Essential (primary) hypertension: Secondary | ICD-10-CM

## 2022-01-07 DIAGNOSIS — N1832 Chronic kidney disease, stage 3b: Secondary | ICD-10-CM

## 2022-01-07 MED ORDER — CONTOUR NEXT TEST VI STRP: ORAL_STRIP | 12 refills | Status: DC

## 2022-01-07 NOTE — Progress Notes (Signed)
Patient ID: Lorraine Foley, female   DOB: 02/05/1941, 81 y.o.   MRN: [id number] ? ? ?Subjective:  ? ? Patient ID: Lorraine Foley, female    DOB: [birth year], 81 y.o.   MRN: [id number] ? ?This visit occurred during the SARS-CoV-2 public health emergency.  Safety protocols were in place, including screening questions prior to the visit, additional usage of staff PPE, and extensive cleaning of exam room while observing appropriate contact time as indicated for disinfecting solutions.  ? ?Patient here for her physical exam. ] ? ?Chief Complaint  ?Patient presents with  ? Annual Exam  ? .  ? ?HPI ?Reports some increased stress and frustration with her weight and eating.  Discussed diet and exercise.  She is starting to walk.  No chest pain.  Feels breathing is stable.  No acid reflux reported.  No abdominal pain or bowel change reported.   ? ? ?Past Medical History:  ?Diagnosis Date  ? Arthritis   ? knees  ? Diabetes mellitus ([place])   ? GERD (gastroesophageal reflux disease)   ? Glaucoma   ? Hypercholesterolemia   ? Hypertension   ? Hypothyroidism   ? Pneumonia   ? ?Past Surgical History:  ?Procedure Laterality Date  ? CATARACT EXTRACTION W/PHACO Left 11/09/2016  ? Procedure: CATARACT EXTRACTION PHACO AND INTRAOCULAR LENS PLACEMENT ([place])  left diabetic complicated;  Surgeon: Leandrew Koyanagi, MD;  Location: [place];  Service: Ophthalmology;  Laterality: Left;  Diabetic - oral meds  ? CATARACT EXTRACTION W/PHACO Right 03/08/2017  ? Procedure: CATARACT EXTRACTION PHACO AND INTRAOCULAR LENS PLACEMENT ([place])  Right Diabetic complicated;  Surgeon: Leandrew Koyanagi, MD;  Location: [place];  Service: Ophthalmology;  Laterality: Right;  ? EYE SURGERY Bilateral 08/2014, 2.2016  ? laser surgery in preparation for glaucoma surgery  ? KNEE ARTHROPLASTY Left 08/06/2018  ? Procedure: COMPUTER ASSISTED TOTAL KNEE ARTHROPLASTY;  Surgeon: Dereck Leep, MD;  Location: [facility];  Service: Orthopedics;  Laterality:  Left;  ? TOTAL HIP ARTHROPLASTY  5/13  ? right  ? ?Family History  ?Problem Relation Age of Onset  ? Mental illness Father   ?     suicide  ? Liver disease Father   ?     alcohol  ? Glaucoma Father   ? Alcohol abuse Father   ? Blindness Father   ? Glaucoma Mother   ? Osteoporosis Mother   ? Diabetes Sister   ? Breast cancer Sister 8  ? Colon cancer Neg Hx   ? ?Social History  ? ?Socioeconomic History  ? Marital status: Divorced  ?  Spouse name: Not on file  ? Number of children: 1  ? Years of education: Not on file  ? Highest education level: Not on file  ?Occupational History  ? Not on file  ?Tobacco Use  ? Smoking status: Never  ? Smokeless tobacco: Never  ?Vaping Use  ? Vaping Use: Never used  ?Substance and Sexual Activity  ? Alcohol use: No  ?  Alcohol/week: 0.0 standard drinks  ? Drug use: No  ? Sexual activity: Not on file  ?Other Topics Concern  ? Not on file  ?Social History Narrative  ? Not on file  ? ?Social Determinants of Health  ? ?Financial Resource Strain: Low Risk   ? Difficulty of Paying Living Expenses: Not hard at all  ?Food Insecurity: Not on file  ?Transportation Needs: Not on file  ?Physical Activity: Not on file  ?Stress: Not on file  ?Social  Connections: Not on file  ? ? ? ?Review of Systems  ?Constitutional:  Negative for appetite change and unexpected weight change.  ?HENT:  Negative for congestion, sinus pressure and sore throat.   ?Eyes:  Negative for pain and visual disturbance.  ?Respiratory:  Negative for cough and chest tightness.   ?     Breathing stable   ?Cardiovascular:  Negative for chest pain, palpitations and leg swelling.  ?Gastrointestinal:  Negative for abdominal pain, diarrhea, nausea and vomiting.  ?Genitourinary:  Negative for difficulty urinating and dysuria.  ?Musculoskeletal:  Negative for joint swelling and myalgias.  ?Skin:  Negative for color change and rash.  ?Neurological:  Negative for dizziness, light-headedness and headaches.  ?Hematological:  Negative for  adenopathy. Does not bruise/bleed easily.  ?Psychiatric/Behavioral:  Negative for agitation and dysphoric mood.   ? ?   ?Objective:  ?  ? ?BP (!) 142/62 (BP Location: Left Arm, Patient Position: Sitting, Cuff Size: Large)   Pulse 62   Temp 98 ?F (36.7 ?C) (Oral)   Ht 5' (1.524 m)   Wt 198 lb 12.8 oz (90.2 kg)   LMP 08/11/1995   SpO2 97%   BMI 38.83 kg/m?  ?Wt Readings from Last 3 Encounters:  ?01/07/22 198 lb 12.8 oz (90.2 kg)  ?06/01/21 193 lb 6.4 oz (87.7 kg)  ?12/22/20 202 lb (91.6 kg)  ? ? ?Physical Exam ?Vitals reviewed.  ?Constitutional:   ?   General: She is not in acute distress. ?   Appearance: Normal appearance. She is well-developed.  ?HENT:  ?   Head: Normocephalic and atraumatic.  ?   Right Ear: External ear normal.  ?   Left Ear: External ear normal.  ?Eyes:  ?   General: No scleral icterus.    ?   Right eye: No discharge.     ?   Left eye: No discharge.  ?   Conjunctiva/sclera: Conjunctivae normal.  ?Neck:  ?   Thyroid: No thyromegaly.  ?Cardiovascular:  ?   Rate and Rhythm: Normal rate and regular rhythm.  ?Pulmonary:  ?   Effort: No tachypnea, accessory muscle usage or respiratory distress.  ?   Breath sounds: Normal breath sounds. No decreased breath sounds or wheezing.  ?Chest:  ?Breasts: ?   Right: No inverted nipple, mass, nipple discharge or tenderness (no axillary adenopathy).  ?   Left: No inverted nipple, mass, nipple discharge or tenderness (no axilarry adenopathy).  ?Abdominal:  ?   General: Bowel sounds are normal.  ?   Palpations: Abdomen is soft.  ?   Tenderness: There is no abdominal tenderness.  ?Musculoskeletal:     ?   General: No swelling or tenderness.  ?   Cervical back: Neck supple.  ?Lymphadenopathy:  ?   Cervical: No cervical adenopathy.  ?Skin: ?   Findings: No erythema or rash.  ?Neurological:  ?   Mental Status: She is alert and oriented to person, place, and time.  ?Psychiatric:     ?   Mood and Affect: Mood normal.     ?   Behavior: Behavior normal.   ? ? ? ?Outpatient Encounter Medications as of 01/07/2022  ?Medication Sig  ? acetaminophen (TYLENOL) 650 MG CR tablet Take 650 mg by mouth daily as needed for pain.  ? amLODipine (NORVASC) 5 MG tablet TAKE 1 TABLET BY MOUTH  TWICE DAILY  ? aspirin EC 81 MG tablet Take 81 mg by mouth daily. Swallow whole.  ? blood glucose meter kit and supplies  Dispense based on patient and insurance preference. Use up to four times daily as directed. (FOR ICD-10 E10.9, E11.9).  ? Cholecalciferol (VITAMIN D-3) 1000 UNITS CAPS Take 1 capsule by mouth daily.  ? cloNIDine (CATAPRES) 0.1 MG tablet Take 1 tablet (0.1 mg total) by mouth 3 (three) times daily.  ? empagliflozin (JARDIANCE) 10 MG TABS tablet Take 1 tablet (10 mg total) by mouth daily before breakfast.  ? glucosamine-chondroitin 500-400 MG tablet Take 1 tablet by mouth 3 (three) times daily.  ? glucose blood (CONTOUR NEXT TEST) test strip Used to check blood sugar twice daily.  ? Lancets (ONETOUCH ULTRASOFT) lancets Use as instructed to check blood sugars once daily. Dx E11.9  ? levothyroxine (SYNTHROID) 100 MCG tablet Take 1 tablet (100 mcg total) by mouth daily.  ? olopatadine (PATANOL) 0.1 % ophthalmic solution 1 drop 2 (two) times daily as needed for allergies.  ? quinapril (ACCUPRIL) 40 MG tablet TAKE 1 TABLET BY MOUTH  TWICE DAILY  ? ROCKLATAN 0.02-0.005 % SOLN Apply 1 drop to eye at bedtime.  ? rosuvastatin (CRESTOR) 20 MG tablet TAKE 1 TABLET(20 MG) BY MOUTH DAILY  ? spironolactone-hydrochlorothiazide (ALDACTAZIDE) 25-25 MG tablet Take 1 tablet by mouth daily.  ? timolol (TIMOPTIC) 0.25 % ophthalmic solution Place 1 drop into both eyes 2 (two) times daily.  ? Turmeric 500 MG CAPS Take 1 capsule by mouth daily.  ? vitamin C (ASCORBIC ACID) 500 MG tablet Take 500 mg by mouth daily.  ? vitamin E 200 UNIT capsule Take 200 Units by mouth daily.  ? [DISCONTINUED] colchicine 0.6 MG tablet Take 1 tablet (0.6 mg total) by mouth 2 (two) times daily as needed.  ? [DISCONTINUED]  glucose blood test strip Use as instructed to check blood sugars twice daily. Dx E11.9  ? ?No facility-administered encounter medications on file as of 01/07/2022.  ?  ? ?Lab Results  ?Component Value Date  ? WBC 9.3 06/01/2021  ?

## 2022-01-10 ENCOUNTER — Encounter: Payer: Self-pay | Admitting: Internal Medicine

## 2022-01-10 NOTE — Assessment & Plan Note (Signed)
Continue amlodipine, quinapril and spironolactone/hctz.  Blood pressure as outlined.  Follow pressures.  Follow metabolic panel.  

## 2022-01-10 NOTE — Assessment & Plan Note (Signed)
Continue pravastatin.  Low cholesterol diet and exercise.  Follow lipid panel and liver function tests.   

## 2022-01-10 NOTE — Assessment & Plan Note (Signed)
Discussed with her today.  Discussed diet and exercise.  She has good support.  Will notify me if feels needs any further intervention.  ?

## 2022-01-10 NOTE — Assessment & Plan Note (Signed)
Check vitamin D level next labs.  

## 2022-01-10 NOTE — Assessment & Plan Note (Signed)
She is frustrated with her weight  Discussed diet and exercise.  Low carb diet.  Follow met b and a1c.   

## 2022-01-10 NOTE — Assessment & Plan Note (Signed)
Continue to avoid antiinflammatories.  Continue ace inhibitor.  Follow metabolic panel.  GFR 35 Lab Results  Component Value Date   CREATININE 1.39 (H) 06/01/2021   

## 2022-01-10 NOTE — Assessment & Plan Note (Signed)
On thyroid replacement.  Follow tsh.  

## 2022-01-11 ENCOUNTER — Other Ambulatory Visit (INDEPENDENT_AMBULATORY_CARE_PROVIDER_SITE_OTHER): Payer: Medicare Other

## 2022-01-11 DIAGNOSIS — E7800 Pure hypercholesterolemia, unspecified: Secondary | ICD-10-CM | POA: Diagnosis not present

## 2022-01-11 DIAGNOSIS — E559 Vitamin D deficiency, unspecified: Secondary | ICD-10-CM

## 2022-01-11 DIAGNOSIS — E039 Hypothyroidism, unspecified: Secondary | ICD-10-CM | POA: Diagnosis not present

## 2022-01-11 DIAGNOSIS — I10 Essential (primary) hypertension: Secondary | ICD-10-CM

## 2022-01-11 DIAGNOSIS — E1165 Type 2 diabetes mellitus with hyperglycemia: Secondary | ICD-10-CM

## 2022-01-11 LAB — LIPID PANEL
Cholesterol: 161 mg/dL (ref 0–200)
HDL: 46.9 mg/dL (ref >39.00)
LDL Cholesterol: 77 mg/dL (ref 0–99)
NonHDL: 113.96
Total CHOL/HDL Ratio: 3
Triglycerides: 185 mg/dL — ABNORMAL HIGH (ref 0.0–149.0)
VLDL: 37 mg/dL (ref 0.0–40.0)

## 2022-01-11 LAB — CBC WITH DIFFERENTIAL/PLATELET
Basophils Absolute: 0 10*3/uL (ref 0.0–0.1)
Basophils Relative: 0.7 % (ref 0.0–3.0)
Eosinophils Absolute: 0.3 10*3/uL (ref 0.0–0.7)
Eosinophils Relative: 4.1 % (ref 0.0–5.0)
HCT: 39.6 % (ref 36.0–46.0)
Hemoglobin: 13.4 g/dL (ref 12.0–15.0)
Lymphocytes Relative: 21.3 % (ref 12.0–46.0)
Lymphs Abs: 1.4 10*3/uL (ref 0.7–4.0)
MCHC: 33.8 g/dL (ref 30.0–36.0)
MCV: 88.2 fl (ref 78.0–100.0)
Monocytes Absolute: 0.6 10*3/uL (ref 0.1–1.0)
Monocytes Relative: 9 % (ref 3.0–12.0)
Neutro Abs: 4.1 10*3/uL (ref 1.4–7.7)
Neutrophils Relative %: 64.9 % (ref 43.0–77.0)
Platelets: 235 10*3/uL (ref 150.0–400.0)
RBC: 4.49 Mil/uL (ref 3.87–5.11)
RDW: 13.4 % (ref 11.5–15.5)
WBC: 6.4 10*3/uL (ref 4.0–10.5)

## 2022-01-11 LAB — HEPATIC FUNCTION PANEL
ALT: 15 U/L (ref 0–35)
AST: 15 U/L (ref 0–37)
Albumin: 4.4 g/dL (ref 3.5–5.2)
Alkaline Phosphatase: 44 U/L (ref 39–117)
Bilirubin, Direct: 0.1 mg/dL (ref 0.0–0.3)
Total Bilirubin: 0.7 mg/dL (ref 0.2–1.2)
Total Protein: 7 g/dL (ref 6.0–8.3)

## 2022-01-11 LAB — BASIC METABOLIC PANEL
BUN: 37 mg/dL — ABNORMAL HIGH (ref 6–23)
CO2: 30 mEq/L (ref 19–32)
Calcium: 10.2 mg/dL (ref 8.4–10.5)
Chloride: 102 mEq/L (ref 96–112)
Creatinine, Ser: 1.29 mg/dL — ABNORMAL HIGH (ref 0.40–1.20)
GFR: 39.06 mL/min — ABNORMAL LOW (ref >60.00)
Glucose, Bld: 149 mg/dL — ABNORMAL HIGH (ref 70–99)
Potassium: 4.9 mEq/L (ref 3.5–5.1)
Sodium: 139 mEq/L (ref 135–145)

## 2022-01-11 LAB — HEMOGLOBIN A1C: Hgb A1c MFr Bld: 8.1 % — ABNORMAL HIGH (ref 4.6–6.5)

## 2022-01-11 LAB — VITAMIN D 25 HYDROXY (VIT D DEFICIENCY, FRACTURES): VITD: 22.62 ng/mL — ABNORMAL LOW (ref 30.00–100.00)

## 2022-01-11 LAB — TSH: TSH: 0.83 u[IU]/mL (ref 0.35–5.50)

## 2022-01-12 ENCOUNTER — Other Ambulatory Visit: Payer: Self-pay

## 2022-01-12 MED ORDER — EMPAGLIFLOZIN 25 MG PO TABS: 25.0000 mg | ORAL_TABLET | Freq: Every day | ORAL | 1 refills | Status: DC

## 2022-02-10 ENCOUNTER — Other Ambulatory Visit: Payer: Self-pay | Admitting: Internal Medicine

## 2022-03-02 ENCOUNTER — Ambulatory Visit (INDEPENDENT_AMBULATORY_CARE_PROVIDER_SITE_OTHER): Payer: Medicare Other

## 2022-03-02 VITALS — Ht 60.0 in | Wt 198.0 lb

## 2022-03-02 DIAGNOSIS — Z0000 Encounter for general adult medical examination without abnormal findings: Secondary | ICD-10-CM | POA: Diagnosis not present

## 2022-03-02 NOTE — Patient Instructions (Addendum)
  Ms. Robeck , Thank you for taking time to come for your Medicare Wellness Visit. I appreciate your ongoing commitment to your health goals. Please review the following plan we discussed and let me know if I can assist you in the future.   These are the goals we discussed:  Goals      Healthy Lifestyle     Use treadmill and stationary bike for exercise Eat a healthy diet Stay hydrated        This is a list of the screening recommended for you and due dates:  Health Maintenance  Topic Date Due   COVID-19 Vaccine (5 - Pfizer series) 03/18/2022*   Tetanus Vaccine  03/03/2023*   Flu Shot  03/29/2022   Mammogram  06/16/2022   Hemoglobin A1C  07/14/2022   Eye exam for diabetics  12/01/2022   Complete foot exam   01/08/2023   Pneumonia Vaccine  Completed   DEXA scan (bone density measurement)  Completed   Zoster (Shingles) Vaccine  Completed   HPV Vaccine  Aged Out  *Topic was postponed. The date shown is not the original due date.

## 2022-03-02 NOTE — Progress Notes (Signed)
 Subjective:   Lorraine Foley is a 81 y.o. female who presents for Medicare Annual (Initial) preventive examination.  Review of Systems    No ROS.  Medicare Wellness Virtual Visit.  Visual/audio telehealth visit, UTA vital signs.   See social history for additional risk factors.   Cardiac Risk Factors include: advanced age (>55men, >65 women)     Objective:    Today's Vitals   03/02/22 0948  Weight: 198 lb (89.8 kg)  Height: 5' (1.524 m)   Body mass index is 38.67 kg/m.     03/02/2022   10:06 AM 08/06/2018    4:57 PM 07/24/2018    1:53 PM 03/08/2017    6:59 AM 11/09/2016    7:58 AM  Advanced Directives  Does Patient Have a Medical Advance Directive? Yes No No No No  Type of Advance Directive Healthcare Power of Attorney;Living will      Does patient want to make changes to medical advance directive? No - Patient declined   Yes (MAU/Ambulatory/Procedural Areas - Information given)   Copy of Healthcare Power of Attorney in Chart? No - copy requested      Would patient like information on creating a medical advance directive?  Yes (Inpatient - patient requests chaplain consult to create a medical advance directive) Yes (MAU/Ambulatory/Procedural Areas - Information given)  Yes (MAU/Ambulatory/Procedural Areas - Information given)    Current Medications (verified) Outpatient Encounter Medications as of 03/02/2022  Medication Sig   empagliflozin (JARDIANCE) 25 MG TABS tablet Take 1 tablet (25 mg total) by mouth daily before breakfast.   acetaminophen (TYLENOL) 650 MG CR tablet Take 650 mg by mouth daily as needed for pain.   amLODipine (NORVASC) 5 MG tablet TAKE 1 TABLET BY MOUTH  TWICE DAILY   aspirin EC 81 MG tablet Take 81 mg by mouth daily. Swallow whole.   blood glucose meter kit and supplies Dispense based on patient and insurance preference. Use up to four times daily as directed. (FOR ICD-10 E10.9, E11.9).   Cholecalciferol (VITAMIN D-3) 1000 UNITS CAPS Take 1 capsule by  mouth daily.   cloNIDine (CATAPRES) 0.1 MG tablet Take 1 tablet (0.1 mg total) by mouth 3 (three) times daily.   glucosamine-chondroitin 500-400 MG tablet Take 1 tablet by mouth 3 (three) times daily.   glucose blood (CONTOUR NEXT TEST) test strip Used to check blood sugar twice daily.   Lancets (ONETOUCH ULTRASOFT) lancets Use as instructed to check blood sugars once daily. Dx E11.9   levothyroxine (SYNTHROID) 100 MCG tablet Take 1 tablet (100 mcg total) by mouth daily.   olopatadine (PATANOL) 0.1 % ophthalmic solution 1 drop 2 (two) times daily as needed for allergies.   quinapril (ACCUPRIL) 40 MG tablet TAKE 1 TABLET BY MOUTH  TWICE DAILY   ROCKLATAN 0.02-0.005 % SOLN Apply 1 drop to eye at bedtime.   rosuvastatin (CRESTOR) 20 MG tablet TAKE 1 TABLET(20 MG) BY MOUTH DAILY   spironolactone-hydrochlorothiazide (ALDACTAZIDE) 25-25 MG tablet Take 1 tablet by mouth daily.   timolol (TIMOPTIC) 0.25 % ophthalmic solution Place 1 drop into both eyes 2 (two) times daily.   Turmeric 500 MG CAPS Take 1 capsule by mouth daily.   vitamin C (ASCORBIC ACID) 500 MG tablet Take 500 mg by mouth daily.   vitamin E 200 UNIT capsule Take 200 Units by mouth daily.   No facility-administered encounter medications on file as of 03/02/2022.    Allergies (verified) Metformin and related   History: Past Medical History:    Diagnosis Date   Arthritis    knees   Diabetes mellitus ([place])    GERD (gastroesophageal reflux disease)    Glaucoma    Hypercholesterolemia    Hypertension    Hypothyroidism    Pneumonia    Past Surgical History:  Procedure Laterality Date   CATARACT EXTRACTION W/PHACO Left 11/09/2016   Procedure: CATARACT EXTRACTION PHACO AND INTRAOCULAR LENS PLACEMENT ([place])  left diabetic complicated;  Surgeon: Leandrew Koyanagi, MD;  Location: [facility];  Service: Ophthalmology;  Laterality: Left;  Diabetic - oral meds   CATARACT EXTRACTION W/PHACO Right 03/08/2017   Procedure: CATARACT  EXTRACTION PHACO AND INTRAOCULAR LENS PLACEMENT ([place])  Right Diabetic complicated;  Surgeon: Leandrew Koyanagi, MD;  Location: [facility];  Service: Ophthalmology;  Laterality: Right;   EYE SURGERY Bilateral 08/2014, 2.2016   laser surgery in preparation for glaucoma surgery   KNEE ARTHROPLASTY Left 08/06/2018   Procedure: COMPUTER ASSISTED TOTAL KNEE ARTHROPLASTY;  Surgeon: Dereck Leep, MD;  Location: [facility];  Service: Orthopedics;  Laterality: Left;   TOTAL HIP ARTHROPLASTY  5/13   right   Family History  Problem Relation Age of Onset   Mental illness Father        suicide   Liver disease Father        alcohol   Glaucoma Father    Alcohol abuse Father    Blindness Father    Glaucoma Mother    Osteoporosis Mother    Diabetes Sister    Breast cancer Sister 34   Colon cancer Neg Hx    Social History   Socioeconomic History   Marital status: Divorced    Spouse name: Not on file   Number of children: 1   Years of education: Not on file   Highest education level: Not on file  Occupational History   Not on file  Tobacco Use   Smoking status: Never   Smokeless tobacco: Never  Vaping Use   Vaping Use: Never used  Substance and Sexual Activity   Alcohol use: No    Alcohol/week: 0.0 standard drinks of alcohol   Drug use: No   Sexual activity: Not on file  Other Topics Concern   Not on file  Social History Narrative   Not on file   Social Determinants of Health   Financial Resource Strain: Low Risk  (03/02/2022)   Overall Financial Resource Strain (CARDIA)    Difficulty of Paying Living Expenses: Not hard at all  Food Insecurity: No Food Insecurity (03/02/2022)   Hunger Vital Sign    Worried About Running Out of Food in the Last Year: Never true    [place] in the Last Year: Never true  Transportation Needs: No Transportation Needs (03/02/2022)   PRAPARE - Hydrologist (Medical): No    Lack of Transportation (Non-Medical):  No  Physical Activity: Not on file  Stress: No Stress Concern Present (03/02/2022)   [place]    Feeling of Stress : Not at all  Social Connections: Unknown (03/02/2022)   Social Connection and Isolation Panel [NHANES]    Frequency of Communication with Friends and Family: More than three times a week    Frequency of Social Gatherings with Friends and Family: More than three times a week    Attends Religious Services: Not on file    Active Member of Clubs or Organizations: Not on file    Attends [employer]  or Organization Meetings: Not on file    Marital Status: Not on file    Tobacco Counseling Counseling given: Not Answered   Clinical Intake: Pre-visit preparation completed: Yes        Diabetes: Yes (Followed by PCP)  How often do you need to have someone help you when you read instructions, pamphlets, or other written materials from your doctor or pharmacy?: 1 - Never Interpreter Needed?: No    Activities of Daily Living    03/02/2022   10:07 AM  In your present state of health, do you have any difficulty performing the following activities:  Hearing? 0  Vision? 0  Difficulty concentrating or making decisions? 0  Walking or climbing stairs? 0  Dressing or bathing? 0  Doing errands, shopping? 0  Preparing Food and eating ? N  Using the Toilet? N  In the past six months, have you accidently leaked urine? N  Do you have problems with loss of bowel control? N  Managing your Medications? N  Managing your Finances? N  Housekeeping or managing your Housekeeping? N   Patient Care Team: Einar Pheasant, MD as PCP - General (Internal Medicine)  Indicate any recent Medical Services you may have received from other than [facility] providers in the past year (date may be approximate).     Assessment:   This is a routine wellness examination for Lorraine Foley. Virtual Visit via Telephone Note  I connected with  Alanson Puls  on 03/02/22 at  9:45 AM EDT by telephone and verified that I am speaking with the correct person using two identifiers.  Persons participating in the virtual visit: patient/Nurse Health Advisor   I discussed the limitations of performing an evaluation and management service by telehealth.  We continued and completed visit with audio only. Some vital signs may be absent or patient reported.   Hearing/Vision screen Hearing Screening - Comments:: Patient is able to hear conversational tones without difficulty.  No issues reported.  Vision Screening - Comments:: Followed [facility], Dr. Wallace Going Wears corrective lenses Cataract extraction, bilateral Glaucoma; drops in use They have seen their ophthalmologist in the last 6 months.   Dietary issues and exercise activities discussed: Current Exercise Habits: Home exercise routine, Type of exercise: walking, Intensity: Mild Regular diet; she tries to eat a healthy diet.   Goals Addressed             This Visit's Progress    Healthy Lifestyle       Use treadmill and stationary bike for exercise Eat a healthy diet Stay hydrated       Depression Screen    03/02/2022    9:54 AM 06/01/2021    7:32 AM 04/29/2020    8:27 AM 08/12/2019   10:30 AM 07/06/2017    3:34 PM 06/03/2016    3:28 PM 06/11/2015    3:49 PM  PHQ 2/9 Scores  PHQ - 2 Score 0 0 0 0 0 0 0    Fall Risk    03/02/2022   10:07 AM 06/01/2021    7:33 AM 04/29/2020    8:27 AM 09/24/2019   11:47 AM 07/06/2017    3:34 PM  Fall Risk   Falls in the past year? 0 0 0 0 No  Number falls in past yr:  0 0    Injury with Fall?  0 0    Risk for fall due to :  No Fall Risks  Impaired mobility   Follow up Falls  evaluation completed Falls evaluation completed Falls evaluation completed Falls evaluation completed    FALL RISK PREVENTION PERTAINING TO THE HOME: Home free of loose throw rugs in walkways, pet beds, electrical cords, etc? Yes  Adequate lighting in your home to  reduce risk of falls? Yes   ASSISTIVE DEVICES UTILIZED TO PREVENT FALLS: Life alert? No  Use of a cane, walker or w/c? Yes   TIMED UP AND GO: Was the test performed? No .   Cognitive Function:  Patient is alert and oriented x3.   Enjoys reading and socializing for brain health stimulation.   Immunizations Immunization History  Administered Date(s) Administered   Fluad Quad(high Dose 65+) 05/13/2019   Influenza Split 07/14/2014   Influenza Whole 07/17/2012   Influenza, High Dose Seasonal PF 06/06/2017, 06/06/2018   Influenza-Unspecified 06/20/2015, 06/14/2016, 05/16/2020, 05/12/2021   PFIZER(Purple Top)SARS-COV-2 Vaccination 09/04/2019, 09/28/2019, 05/27/2020, 05/12/2021   Pneumococcal Conjugate-13 11/05/2013   Pneumococcal Polysaccharide-23 09/26/2011   Zoster Recombinat (Shingrix) 05/09/2018, 11/01/2018   TDAP status: Due, Education has been provided regarding the importance of this vaccine. Advised may receive this vaccine at local pharmacy or Health Dept. Aware to provide a copy of the vaccination record if obtained from local pharmacy or Health Dept. Verbalized acceptance and understanding.  Screening Tests Health Maintenance  Topic Date Due   COVID-19 Vaccine (5 - Pfizer series) 03/18/2022 (Originally 07/07/2021)   TETANUS/TDAP  03/03/2023 (Originally 01/12/1960)   INFLUENZA VACCINE  03/29/2022   MAMMOGRAM  06/16/2022   HEMOGLOBIN A1C  07/14/2022   OPHTHALMOLOGY EXAM  12/01/2022   FOOT EXAM  01/08/2023   Pneumonia Vaccine 28+ Years old  Completed   DEXA SCAN  Completed   Zoster Vaccines- Shingrix  Completed   HPV VACCINES  Aged Out   Health Maintenance There are no preventive care reminders to display for this patient.  Lung Cancer Screening: (Low Dose CT Chest recommended if Age 25-80 years, 30 pack-year currently smoking OR have quit w/in 15years.) does not qualify.   Hepatitis C Screening: does not qualify.  Vision Screening: Recommended annual ophthalmology  exams for early detection of glaucoma and other disorders of the eye.  Dental Screening: Recommended annual dental exams for proper oral hygiene  Community Resource Referral / Chronic Care Management: CRR required this visit?  No   CCM required this visit?  No      Plan:   Keep all routine maintenance appointments.   I have personally reviewed and noted the following in the patient's chart:   Medical and social history Use of alcohol, tobacco or illicit drugs  Current medications and supplements including opioid prescriptions.  Functional ability and status Nutritional status Physical activity Advanced directives List of other physicians Hospitalizations, surgeries, and ER visits in previous 12 months Vitals Screenings to include cognitive, depression, and falls Referrals and appointments  In addition, I have reviewed and discussed with patient certain preventive protocols, quality metrics, and best practice recommendations. A written personalized care plan for preventive services as well as general preventive health recommendations were provided to patient.     Varney Biles, LPN   10/29/9922

## 2022-03-14 ENCOUNTER — Other Ambulatory Visit: Payer: Self-pay

## 2022-03-14 ENCOUNTER — Telehealth: Payer: Self-pay | Admitting: Internal Medicine

## 2022-03-14 MED ORDER — QUINAPRIL HCL 40 MG PO TABS: 40.0000 mg | ORAL_TABLET | Freq: Two times a day (BID) | ORAL | 3 refills | Status: DC

## 2022-03-14 MED ORDER — EMPAGLIFLOZIN 25 MG PO TABS: 25.0000 mg | ORAL_TABLET | Freq: Every day | ORAL | 1 refills | Status: DC

## 2022-03-14 NOTE — Telephone Encounter (Signed)
Pt called back and confirmed pharmacy info . Correct pharmacy is [facility] in [place]

## 2022-03-14 NOTE — Telephone Encounter (Signed)
Patient is calling to have the following refilled: empagliflozin (JARDIANCE) 25 MG TABS tablet, quinapril (ACCUPRIL) 40 MG tablet.   Please send to [facility] [address] [place] [address] [phone].

## 2022-03-14 NOTE — Telephone Encounter (Signed)
sent 

## 2022-03-14 NOTE — Telephone Encounter (Signed)
Lm for pt to confirm pharmacy info

## 2022-04-15 ENCOUNTER — Ambulatory Visit: Payer: Medicare Other | Admitting: Internal Medicine

## 2022-04-28 ENCOUNTER — Ambulatory Visit (INDEPENDENT_AMBULATORY_CARE_PROVIDER_SITE_OTHER): Payer: Medicare Other | Admitting: Internal Medicine

## 2022-04-28 ENCOUNTER — Encounter: Payer: Self-pay | Admitting: Internal Medicine

## 2022-04-28 VITALS — BP 130/70 | HR 55 | Temp 97.6°F | Resp 14 | Ht 60.0 in | Wt 192.2 lb

## 2022-04-28 DIAGNOSIS — E039 Hypothyroidism, unspecified: Secondary | ICD-10-CM | POA: Diagnosis not present

## 2022-04-28 DIAGNOSIS — E7800 Pure hypercholesterolemia, unspecified: Secondary | ICD-10-CM | POA: Diagnosis not present

## 2022-04-28 DIAGNOSIS — E1165 Type 2 diabetes mellitus with hyperglycemia: Secondary | ICD-10-CM | POA: Diagnosis not present

## 2022-04-28 DIAGNOSIS — F439 Reaction to severe stress, unspecified: Secondary | ICD-10-CM

## 2022-04-28 DIAGNOSIS — I10 Essential (primary) hypertension: Secondary | ICD-10-CM | POA: Diagnosis not present

## 2022-04-28 DIAGNOSIS — N1832 Chronic kidney disease, stage 3b: Secondary | ICD-10-CM

## 2022-04-28 LAB — BASIC METABOLIC PANEL
BUN: 36 mg/dL — ABNORMAL HIGH (ref 6–23)
CO2: 32 mEq/L (ref 19–32)
Calcium: 10.1 mg/dL (ref 8.4–10.5)
Chloride: 97 mEq/L (ref 96–112)
Creatinine, Ser: 1.52 mg/dL — ABNORMAL HIGH (ref 0.40–1.20)
GFR: 32.02 mL/min — ABNORMAL LOW (ref >60.00)
Glucose, Bld: 142 mg/dL — ABNORMAL HIGH (ref 70–99)
Potassium: 4.7 mEq/L (ref 3.5–5.1)
Sodium: 138 mEq/L (ref 135–145)

## 2022-04-28 LAB — HEMOGLOBIN A1C: Hgb A1c MFr Bld: 8.2 % — ABNORMAL HIGH (ref 4.6–6.5)

## 2022-04-28 LAB — HEPATIC FUNCTION PANEL
ALT: 15 U/L (ref 0–35)
AST: 16 U/L (ref 0–37)
Albumin: 4.5 g/dL (ref 3.5–5.2)
Alkaline Phosphatase: 47 U/L (ref 39–117)
Bilirubin, Direct: 0.1 mg/dL (ref 0.0–0.3)
Total Bilirubin: 0.8 mg/dL (ref 0.2–1.2)
Total Protein: 7.3 g/dL (ref 6.0–8.3)

## 2022-04-28 LAB — LIPID PANEL
Cholesterol: 152 mg/dL (ref 0–200)
HDL: 44.8 mg/dL (ref >39.00)
NonHDL: 107.53
Total CHOL/HDL Ratio: 3
Triglycerides: 215 mg/dL — ABNORMAL HIGH (ref 0.0–149.0)
VLDL: 43 mg/dL — ABNORMAL HIGH (ref 0.0–40.0)

## 2022-04-28 LAB — LDL CHOLESTEROL, DIRECT: Direct LDL: 80 mg/dL

## 2022-04-28 MED ORDER — QUINAPRIL HCL 40 MG PO TABS: 40.0000 mg | ORAL_TABLET | Freq: Two times a day (BID) | ORAL | 3 refills | Status: DC

## 2022-04-28 NOTE — Progress Notes (Signed)
Patient ID: Lorraine Foley, female   DOB: [birth year], 81 y.o.   MRN: [id number]   Subjective:    Patient ID: Lorraine Foley, female    DOB: 10-07-1940, 81 y.o.   MRN: [id number]   Patient here for scheduled follow up.   Chief Complaint  Patient presents with   Follow-up    3 mon, RF quinapril, denies any pain.    Marland Kitchen   HPI Follow up regarding blood sugar, blood pressure and cholesterol.  She recently went to visit her daughter.  Was more physically active.  Plans to join gym and continue with increased activity.  No chest pain reported.  Breathing stable.  Discussed diet and exercise.  Wants to work on her snacking.  No acid reflux.  No abdominal pain.  Bowels stable.    Past Medical History:  Diagnosis Date   Arthritis    knees   Diabetes mellitus ([facility])    GERD (gastroesophageal reflux disease)    Glaucoma    Hypercholesterolemia    Hypertension    Hypothyroidism    Pneumonia    Past Surgical History:  Procedure Laterality Date   CATARACT EXTRACTION W/PHACO Left 11/09/2016   Procedure: CATARACT EXTRACTION PHACO AND INTRAOCULAR LENS PLACEMENT ([place])  left diabetic complicated;  Surgeon: Leandrew Koyanagi, MD;  Location: [facility];  Service: Ophthalmology;  Laterality: Left;  Diabetic - oral meds   CATARACT EXTRACTION W/PHACO Right 03/08/2017   Procedure: CATARACT EXTRACTION PHACO AND INTRAOCULAR LENS PLACEMENT ([place])  Right Diabetic complicated;  Surgeon: Leandrew Koyanagi, MD;  Location: [facility];  Service: Ophthalmology;  Laterality: Right;   EYE SURGERY Bilateral 08/2014, 2.2016   laser surgery in preparation for glaucoma surgery   KNEE ARTHROPLASTY Left 08/06/2018   Procedure: COMPUTER ASSISTED TOTAL KNEE ARTHROPLASTY;  Surgeon: Dereck Leep, MD;  Location: [facility];  Service: Orthopedics;  Laterality: Left;   TOTAL HIP ARTHROPLASTY  5/13   right   Family History  Problem Relation Age of Onset   Mental illness Father        suicide   Liver disease  Father        alcohol   Glaucoma Father    Alcohol abuse Father    Blindness Father    Glaucoma Mother    Osteoporosis Mother    Diabetes Sister    Breast cancer Sister 25   Colon cancer Neg Hx    Social History   Socioeconomic History   Marital status: Divorced    Spouse name: Not on file   Number of children: 1   Years of education: Not on file   Highest education level: Not on file  Occupational History   Not on file  Tobacco Use   Smoking status: Never   Smokeless tobacco: Never  Vaping Use   Vaping Use: Never used  Substance and Sexual Activity   Alcohol use: No    Alcohol/week: 0.0 standard drinks of alcohol   Drug use: No   Sexual activity: Not on file  Other Topics Concern   Not on file  Social History Narrative   Not on file   Social Determinants of Health   Financial Resource Strain: Low Risk  (03/02/2022)   Overall Financial Resource Strain (CARDIA)    Difficulty of Paying Living Expenses: Not hard at all  Food Insecurity: No Food Insecurity (03/02/2022)   Hunger Vital Sign    Worried About Running Out of Food in the Last Year: Never true  Ran Out of Food in the Last Year: Never true  Transportation Needs: No Transportation Needs (03/02/2022)   PRAPARE - Hydrologist (Medical): No    Lack of Transportation (Non-Medical): No  Physical Activity: Not on file  Stress: No Stress Concern Present (03/02/2022)   [place]    Feeling of Stress : Not at all  Social Connections: Unknown (03/02/2022)   Social Connection and Isolation Panel [NHANES]    Frequency of Communication with Friends and Family: More than three times a week    Frequency of Social Gatherings with Friends and Family: More than three times a week    Attends Religious Services: Not on Advertising copywriter or Organizations: Not on file    Attends Archivist Meetings: Not on file     Marital Status: Not on file     Review of Systems  Constitutional:  Negative for appetite change and unexpected weight change.  HENT:  Negative for congestion and sinus pressure.   Respiratory:  Negative for cough, chest tightness and shortness of breath.   Cardiovascular:  Negative for chest pain, palpitations and leg swelling.  Gastrointestinal:  Negative for abdominal pain, diarrhea, nausea and vomiting.  Genitourinary:  Negative for difficulty urinating and dysuria.  Musculoskeletal:  Negative for joint swelling and myalgias.  Skin:  Negative for color change and rash.  Neurological:  Negative for dizziness, light-headedness and headaches.  Psychiatric/Behavioral:  Negative for agitation and dysphoric mood.        Objective:     BP 130/70 (BP Location: Left Arm, Patient Position: Sitting, Cuff Size: Normal)   Pulse (!) 55   Temp 97.6 F (36.4 C) (Oral)   Resp 14   Ht 5' (1.524 m)   Wt 192 lb 3.2 oz (87.2 kg)   LMP 08/11/1995   SpO2 99%   BMI 37.54 kg/m  Wt Readings from Last 3 Encounters:  04/28/22 192 lb 3.2 oz (87.2 kg)  03/02/22 198 lb (89.8 kg)  01/07/22 198 lb 12.8 oz (90.2 kg)    Physical Exam Vitals reviewed.  Constitutional:      General: She is not in acute distress.    Appearance: Normal appearance.  HENT:     Head: Normocephalic and atraumatic.     Right Ear: External ear normal.     Left Ear: External ear normal.  Eyes:     General: No scleral icterus.       Right eye: No discharge.        Left eye: No discharge.     Conjunctiva/sclera: Conjunctivae normal.  Neck:     Thyroid: No thyromegaly.  Cardiovascular:     Rate and Rhythm: Normal rate and regular rhythm.  Pulmonary:     Effort: No respiratory distress.     Breath sounds: Normal breath sounds. No wheezing.  Abdominal:     General: Bowel sounds are normal.     Palpations: Abdomen is soft.     Tenderness: There is no abdominal tenderness.  Musculoskeletal:        General: No swelling  or tenderness.     Cervical back: Neck supple. No tenderness.  Lymphadenopathy:     Cervical: No cervical adenopathy.  Skin:    Findings: No erythema or rash.  Neurological:     Mental Status: She is alert.  Psychiatric:        Mood and Affect: Mood  normal.        Behavior: Behavior normal.      Outpatient Encounter Medications as of 04/28/2022  Medication Sig   acetaminophen (TYLENOL) 650 MG CR tablet Take 650 mg by mouth daily as needed for pain.   amLODipine (NORVASC) 5 MG tablet TAKE 1 TABLET BY MOUTH  TWICE DAILY   aspirin EC 81 MG tablet Take 81 mg by mouth daily. Swallow whole.   blood glucose meter kit and supplies Dispense based on patient and insurance preference. Use up to four times daily as directed. (FOR ICD-10 E10.9, E11.9).   Cholecalciferol (VITAMIN D-3) 1000 UNITS CAPS Take 1 capsule by mouth daily.   cloNIDine (CATAPRES) 0.1 MG tablet Take 1 tablet (0.1 mg total) by mouth 3 (three) times daily.   empagliflozin (JARDIANCE) 25 MG TABS tablet Take 1 tablet (25 mg total) by mouth daily before breakfast.   glucosamine-chondroitin 500-400 MG tablet Take 1 tablet by mouth 3 (three) times daily.   glucose blood (CONTOUR NEXT TEST) test strip Used to check blood sugar twice daily.   Lancets (ONETOUCH ULTRASOFT) lancets Use as instructed to check blood sugars once daily. Dx E11.9   levothyroxine (SYNTHROID) 100 MCG tablet Take 1 tablet (100 mcg total) by mouth daily.   ROCKLATAN 0.02-0.005 % SOLN Apply 1 drop to eye at bedtime.   rosuvastatin (CRESTOR) 20 MG tablet TAKE 1 TABLET(20 MG) BY MOUTH DAILY   spironolactone-hydrochlorothiazide (ALDACTAZIDE) 25-25 MG tablet Take 1 tablet by mouth daily.   timolol (TIMOPTIC) 0.25 % ophthalmic solution Place 1 drop into both eyes 2 (two) times daily.   Turmeric 500 MG CAPS Take 1 capsule by mouth daily.   vitamin C (ASCORBIC ACID) 500 MG tablet Take 500 mg by mouth daily.   vitamin E 200 UNIT capsule Take 200 Units by mouth daily.    [DISCONTINUED] quinapril (ACCUPRIL) 40 MG tablet Take 1 tablet (40 mg total) by mouth 2 (two) times daily.   quinapril (ACCUPRIL) 40 MG tablet Take 1 tablet (40 mg total) by mouth 2 (two) times daily.   [DISCONTINUED] olopatadine (PATANOL) 0.1 % ophthalmic solution 1 drop 2 (two) times daily as needed for allergies. (Patient not taking: Reported on 04/28/2022)   No facility-administered encounter medications on file as of 04/28/2022.     Lab Results  Component Value Date   WBC 6.4 01/11/2022   HGB 13.4 01/11/2022   HCT 39.6 01/11/2022   PLT 235.0 01/11/2022   GLUCOSE 142 (H) 04/28/2022   CHOL 152 04/28/2022   TRIG 215.0 (H) 04/28/2022   HDL 44.80 04/28/2022   LDLDIRECT 80.0 04/28/2022   LDLCALC 77 01/11/2022   ALT 15 04/28/2022   AST 16 04/28/2022   NA 138 04/28/2022   K 4.7 04/28/2022   CL 97 04/28/2022   CREATININE 1.52 (H) 04/28/2022   BUN 36 (H) 04/28/2022   CO2 32 04/28/2022   TSH 0.83 01/11/2022   INR 1.00 07/24/2018   HGBA1C 8.2 (H) 04/28/2022   MICROALBUR 1.5 06/01/2021    MM 3D SCREEN BREAST BILATERAL  Result Date: 06/21/2021 CLINICAL DATA:  Screening. EXAM: DIGITAL SCREENING BILATERAL MAMMOGRAM WITH TOMOSYNTHESIS AND CAD TECHNIQUE: Bilateral screening digital craniocaudal and mediolateral oblique mammograms were obtained. Bilateral screening digital breast tomosynthesis was performed. The images were evaluated with computer-aided detection. COMPARISON:  Previous exam(s). ACR Breast Density Category b: There are scattered areas of fibroglandular density. FINDINGS: There are no findings suspicious for malignancy. IMPRESSION: No mammographic evidence of malignancy. A result letter of this  screening mammogram will be mailed directly to the patient. RECOMMENDATION: Screening mammogram in one year. (Code:SM-B-01Y) BI-RADS CATEGORY  1: Negative. Electronically Signed   By: Lajean Manes M.D.   On: 06/21/2021 10:49      Assessment & Plan:   Problem List Items Addressed This  Visit     CKD (chronic kidney disease), stage III ([place])    Continue to avoid antiinflammatories.  Continue ace inhibitor.  Follow metabolic panel.   Lab Results  Component Value Date   CREATININE 1.52 (H) 04/28/2022       Hypercholesterolemia    Continue pravastatin.  Low cholesterol diet and exercise.  Follow lipid panel and liver function tests.        Relevant Medications   quinapril (ACCUPRIL) 40 MG tablet   Other Relevant Orders   Lipid panel (Completed)   Hepatic function panel (Completed)   Hypertension - Primary    Continue amlodipine, quinapril and spironolactone/hctz.  Blood pressure as outlined.  Follow pressures.  Follow metabolic panel.       Relevant Medications   quinapril (ACCUPRIL) 40 MG tablet   Other Relevant Orders   Basic metabolic panel (Completed)   Hypothyroidism    On thyroid replacement.  Follow tsh.       Stress    Overall appears to be handling things relatively well. Plans to join a gym.  Follow.       Type 2 diabetes mellitus with hyperglycemia ([place])    She is frustrated with her weight  Discussed diet and exercise.  Low carb diet.  Follow met b and a1c.        Relevant Medications   quinapril (ACCUPRIL) 40 MG tablet   Other Relevant Orders   Basic metabolic panel (Completed)   Hemoglobin A1c (Completed)     Einar Pheasant, MD

## 2022-05-03 ENCOUNTER — Other Ambulatory Visit: Payer: Self-pay

## 2022-05-03 ENCOUNTER — Encounter: Payer: Self-pay | Admitting: Internal Medicine

## 2022-05-03 DIAGNOSIS — N1832 Chronic kidney disease, stage 3b: Secondary | ICD-10-CM

## 2022-05-03 NOTE — Assessment & Plan Note (Signed)
On thyroid replacement.  Follow tsh.  

## 2022-05-03 NOTE — Assessment & Plan Note (Signed)
Continue to avoid antiinflammatories.  Continue ace inhibitor.  Follow metabolic panel.   Lab Results  Component Value Date   CREATININE 1.52 (H) 04/28/2022

## 2022-05-03 NOTE — Assessment & Plan Note (Signed)
She is frustrated with her weight  Discussed diet and exercise.  Low carb diet.  Follow met b and a1c.

## 2022-05-03 NOTE — Assessment & Plan Note (Signed)
Overall appears to be handling things relatively well. Plans to join a gym.  Follow.

## 2022-05-03 NOTE — Assessment & Plan Note (Signed)
Continue pravastatin.  Low cholesterol diet and exercise.  Follow lipid panel and liver function tests.   

## 2022-05-03 NOTE — Assessment & Plan Note (Signed)
Continue amlodipine, quinapril and spironolactone/hctz.  Blood pressure as outlined.  Follow pressures.  Follow metabolic panel.

## 2022-05-04 ENCOUNTER — Other Ambulatory Visit: Payer: Self-pay | Admitting: Internal Medicine

## 2022-05-04 DIAGNOSIS — N1832 Chronic kidney disease, stage 3b: Secondary | ICD-10-CM

## 2022-05-04 NOTE — Progress Notes (Signed)
Order placed for renal ultrasound and for referral to nephrology.  

## 2022-05-08 ENCOUNTER — Other Ambulatory Visit: Payer: Self-pay | Admitting: Internal Medicine

## 2022-05-11 ENCOUNTER — Other Ambulatory Visit: Payer: Self-pay | Admitting: Internal Medicine

## 2022-05-11 ENCOUNTER — Ambulatory Visit
Admission: RE | Admit: 2022-05-11 | Discharge: 2022-05-11 | Disposition: A | Discharge status: Home / Self Care | Payer: Medicare Other | Source: Ambulatory Visit | Attending: Internal Medicine | Admitting: Internal Medicine

## 2022-05-11 DIAGNOSIS — N1832 Chronic kidney disease, stage 3b: Secondary | ICD-10-CM

## 2022-05-11 DIAGNOSIS — N189 Chronic kidney disease, unspecified: Secondary | ICD-10-CM | POA: Diagnosis not present

## 2022-05-11 DIAGNOSIS — N281 Cyst of kidney, acquired: Secondary | ICD-10-CM | POA: Diagnosis not present

## 2022-05-16 ENCOUNTER — Other Ambulatory Visit: Payer: Self-pay | Admitting: Internal Medicine

## 2022-05-16 DIAGNOSIS — Z1231 Encounter for screening mammogram for malignant neoplasm of breast: Secondary | ICD-10-CM

## 2022-05-17 ENCOUNTER — Other Ambulatory Visit: Payer: Self-pay | Admitting: Internal Medicine

## 2022-05-17 DIAGNOSIS — N2889 Other specified disorders of kidney and ureter: Secondary | ICD-10-CM

## 2022-05-17 DIAGNOSIS — N281 Cyst of kidney, acquired: Secondary | ICD-10-CM

## 2022-05-17 NOTE — Progress Notes (Signed)
Order placed for urology referral.  

## 2022-05-19 ENCOUNTER — Ambulatory Visit (INDEPENDENT_AMBULATORY_CARE_PROVIDER_SITE_OTHER): Payer: Medicare Other | Admitting: Internal Medicine

## 2022-05-19 ENCOUNTER — Encounter: Payer: Self-pay | Admitting: Internal Medicine

## 2022-05-19 DIAGNOSIS — N1832 Chronic kidney disease, stage 3b: Secondary | ICD-10-CM | POA: Diagnosis not present

## 2022-05-19 DIAGNOSIS — E039 Hypothyroidism, unspecified: Secondary | ICD-10-CM | POA: Diagnosis not present

## 2022-05-19 DIAGNOSIS — F439 Reaction to severe stress, unspecified: Secondary | ICD-10-CM

## 2022-05-19 DIAGNOSIS — E1165 Type 2 diabetes mellitus with hyperglycemia: Secondary | ICD-10-CM

## 2022-05-19 DIAGNOSIS — I10 Essential (primary) hypertension: Secondary | ICD-10-CM

## 2022-05-19 DIAGNOSIS — E7800 Pure hypercholesterolemia, unspecified: Secondary | ICD-10-CM

## 2022-05-19 LAB — BASIC METABOLIC PANEL
BUN: 43 mg/dL — ABNORMAL HIGH (ref 6–23)
CO2: 31 mEq/L (ref 19–32)
Calcium: 10.2 mg/dL (ref 8.4–10.5)
Chloride: 97 mEq/L (ref 96–112)
Creatinine, Ser: 1.33 mg/dL — ABNORMAL HIGH (ref 0.40–1.20)
GFR: 37.57 mL/min — ABNORMAL LOW (ref >60.00)
Glucose, Bld: 144 mg/dL — ABNORMAL HIGH (ref 70–99)
Potassium: 5 mEq/L (ref 3.5–5.1)
Sodium: 136 mEq/L (ref 135–145)

## 2022-05-19 MED ORDER — LISINOPRIL 20 MG PO TABS: 20.0000 mg | ORAL_TABLET | Freq: Every day | ORAL | 1 refills | Status: DC

## 2022-05-19 NOTE — Progress Notes (Signed)
Patient ID: Lorraine Foley, female   DOB: [birth year], 81 y.o.   MRN: [id number]   Subjective:    Patient ID: Lorraine Foley, female    DOB: [birth year], 81 y.o.   MRN: [id number]   Patient here for  Chief Complaint  Patient presents with   Follow-up    Diabetic follow up   .   HPI Here to follow up regarding her diabetes and hypertension.  Reports she has increased her water intake.  Trying to be more active.  Discussed diet and exercise.  Reviewed outside blood sugars.  Overall appear to be improved - sugars averaging mostly 120-170s.  No chest pain or sob reported.  No abdominal pain.  Bowels moving.    Past Medical History:  Diagnosis Date   Arthritis    knees   Diabetes mellitus (HCC)    GERD (gastroesophageal reflux disease)    Glaucoma    Hypercholesterolemia    Hypertension    Hypothyroidism    Pneumonia    Past Surgical History:  Procedure Laterality Date   CATARACT EXTRACTION W/PHACO Left 11/09/2016   Procedure: CATARACT EXTRACTION PHACO AND INTRAOCULAR LENS PLACEMENT ([place])  left diabetic complicated;  Surgeon: Leandrew Koyanagi, MD;  Location: [facility];  Service: Ophthalmology;  Laterality: Left;  Diabetic - oral meds   CATARACT EXTRACTION W/PHACO Right 03/08/2017   Procedure: CATARACT EXTRACTION PHACO AND INTRAOCULAR LENS PLACEMENT ([place])  Right Diabetic complicated;  Surgeon: Leandrew Koyanagi, MD;  Location: [facility];  Service: Ophthalmology;  Laterality: Right;   EYE SURGERY Bilateral 08/2014, 2.2016   laser surgery in preparation for glaucoma surgery   KNEE ARTHROPLASTY Left 08/06/2018   Procedure: COMPUTER ASSISTED TOTAL KNEE ARTHROPLASTY;  Surgeon: Dereck Leep, MD;  Location: [facility];  Service: Orthopedics;  Laterality: Left;   TOTAL HIP ARTHROPLASTY  5/13   right   Family History  Problem Relation Age of Onset   Mental illness Father        suicide   Liver disease Father        alcohol   Glaucoma Father    Alcohol abuse Father     Blindness Father    Glaucoma Mother    Osteoporosis Mother    Diabetes Sister    Breast cancer Sister 43   Colon cancer Neg Hx    Social History   Socioeconomic History   Marital status: Divorced    Spouse name: Not on file   Number of children: 1   Years of education: Not on file   Highest education level: Not on file  Occupational History   Not on file  Tobacco Use   Smoking status: Never   Smokeless tobacco: Never  Vaping Use   Vaping Use: Never used  Substance and Sexual Activity   Alcohol use: No    Alcohol/week: 0.0 standard drinks of alcohol   Drug use: No   Sexual activity: Not on file  Other Topics Concern   Not on file  Social History Narrative   Not on file   Social Determinants of Health   Financial Resource Strain: Low Risk  (03/02/2022)   Overall Financial Resource Strain (CARDIA)    Difficulty of Paying Living Expenses: Not hard at all  Food Insecurity: No Food Insecurity (03/02/2022)   Hunger Vital Sign    Worried About Running Out of Food in the Last Year: Never true    Ran Out of Food in the Last Year: Never true  Transportation Needs: No  Transportation Needs (03/02/2022)   PRAPARE - Hydrologist (Medical): No    Lack of Transportation (Non-Medical): No  Physical Activity: Not on file  Stress: No Stress Concern Present (03/02/2022)   [place]    Feeling of Stress : Not at all  Social Connections: Unknown (03/02/2022)   Social Connection and Isolation Panel [NHANES]    Frequency of Communication with Friends and Family: More than three times a week    Frequency of Social Gatherings with Friends and Family: More than three times a week    Attends Religious Services: Not on Advertising copywriter or Organizations: Not on file    Attends Archivist Meetings: Not on file    Marital Status: Not on file     Review of Systems   Constitutional:  Negative for appetite change and unexpected weight change.  HENT:  Negative for congestion and sinus pressure.   Respiratory:  Negative for cough, chest tightness and shortness of breath.   Cardiovascular:  Negative for chest pain, palpitations and leg swelling.  Gastrointestinal:  Negative for abdominal pain, diarrhea, nausea and vomiting.  Genitourinary:  Negative for difficulty urinating and dysuria.  Musculoskeletal:  Negative for joint swelling and myalgias.  Skin:  Negative for color change and rash.  Neurological:  Negative for dizziness, light-headedness and headaches.  Psychiatric/Behavioral:  Negative for agitation and dysphoric mood.        Objective:     BP 128/60 (BP Location: Right Arm, Patient Position: Sitting, Cuff Size: Normal)   Pulse (!) 56   Temp 97.6 F (36.4 C) (Oral)   Ht 5' (1.524 m)   Wt 195 lb 9.6 oz (88.7 kg)   LMP 08/11/1995   SpO2 93%   BMI 38.20 kg/m  Wt Readings from Last 3 Encounters:  05/19/22 195 lb 9.6 oz (88.7 kg)  04/28/22 192 lb 3.2 oz (87.2 kg)  03/02/22 198 lb (89.8 kg)    Physical Exam Vitals reviewed.  Constitutional:      General: She is not in acute distress.    Appearance: Normal appearance.  HENT:     Head: Normocephalic and atraumatic.     Right Ear: External ear normal.     Left Ear: External ear normal.  Eyes:     General: No scleral icterus.       Right eye: No discharge.        Left eye: No discharge.     Conjunctiva/sclera: Conjunctivae normal.  Neck:     Thyroid: No thyromegaly.  Cardiovascular:     Rate and Rhythm: Normal rate and regular rhythm.  Pulmonary:     Effort: No respiratory distress.     Breath sounds: Normal breath sounds. No wheezing.  Abdominal:     General: Bowel sounds are normal.     Palpations: Abdomen is soft.     Tenderness: There is no abdominal tenderness.  Musculoskeletal:        General: No swelling or tenderness.     Cervical back: Neck supple. No tenderness.   Lymphadenopathy:     Cervical: No cervical adenopathy.  Skin:    Findings: No erythema or rash.  Neurological:     Mental Status: She is alert.  Psychiatric:        Mood and Affect: Mood normal.        Behavior: Behavior normal.      Outpatient Encounter  Medications as of 05/19/2022  Medication Sig   acetaminophen (TYLENOL) 650 MG CR tablet Take 650 mg by mouth daily as needed for pain.   amLODipine (NORVASC) 5 MG tablet TAKE 1 TABLET BY MOUTH  TWICE DAILY   aspirin EC 81 MG tablet Take 81 mg by mouth daily. Swallow whole.   blood glucose meter kit and supplies Dispense based on patient and insurance preference. Use up to four times daily as directed. (FOR ICD-10 E10.9, E11.9).   Cholecalciferol (VITAMIN D-3) 1000 UNITS CAPS Take 1 capsule by mouth daily.   cloNIDine (CATAPRES) 0.1 MG tablet TAKE 1 TABLET(0.1 MG) BY MOUTH THREE TIMES DAILY   glucosamine-chondroitin 500-400 MG tablet Take 1 tablet by mouth 3 (three) times daily.   glucose blood (CONTOUR NEXT TEST) test strip Used to check blood sugar twice daily.   JARDIANCE 25 MG TABS tablet TAKE 1 TABLET(25 MG) BY MOUTH DAILY BEFORE BREAKFAST   Lancets (ONETOUCH ULTRASOFT) lancets Use as instructed to check blood sugars once daily. Dx E11.9   levothyroxine (SYNTHROID) 100 MCG tablet TAKE 1 TABLET(100 MCG) BY MOUTH DAILY   lisinopril (ZESTRIL) 20 MG tablet Take 1 tablet (20 mg total) by mouth daily.   ROCKLATAN 0.02-0.005 % SOLN Apply 1 drop to eye at bedtime.   rosuvastatin (CRESTOR) 20 MG tablet TAKE 1 TABLET(20 MG) BY MOUTH DAILY   spironolactone-hydrochlorothiazide (ALDACTAZIDE) 25-25 MG tablet TAKE 1 TABLET BY MOUTH DAILY   timolol (TIMOPTIC) 0.25 % ophthalmic solution Place 1 drop into both eyes 2 (two) times daily.   Turmeric 500 MG CAPS Take 1 capsule by mouth daily.   vitamin C (ASCORBIC ACID) 500 MG tablet Take 500 mg by mouth daily.   vitamin E 200 UNIT capsule Take 200 Units by mouth daily.   [DISCONTINUED] quinapril  (ACCUPRIL) 40 MG tablet Take 1 tablet (40 mg total) by mouth 2 (two) times daily.   No facility-administered encounter medications on file as of 05/19/2022.     Lab Results  Component Value Date   WBC 6.4 01/11/2022   HGB 13.4 01/11/2022   HCT 39.6 01/11/2022   PLT 235.0 01/11/2022   GLUCOSE 144 (H) 05/19/2022   CHOL 152 04/28/2022   TRIG 215.0 (H) 04/28/2022   HDL 44.80 04/28/2022   LDLDIRECT 80.0 04/28/2022   LDLCALC 77 01/11/2022   ALT 15 04/28/2022   AST 16 04/28/2022   NA 136 05/19/2022   K 5.0 05/19/2022   CL 97 05/19/2022   CREATININE 1.33 (H) 05/19/2022   BUN 43 (H) 05/19/2022   CO2 31 05/19/2022   TSH 0.83 01/11/2022   INR 1.00 07/24/2018   HGBA1C 8.2 (H) 04/28/2022   MICROALBUR 1.5 06/01/2021    US Renal  Result Date: 05/13/2022 CLINICAL DATA:  Chronic renal disease EXAM: RENAL / URINARY TRACT ULTRASOUND COMPLETE COMPARISON:  CT abdomen pelvis November 09, 2007 FINDINGS: Right Kidney: Renal measurements: 9.7 x 3.6 x 4.7 cm = volume: 84.8 mL. Increased renal cortical echogenicity. Renal cortical thinning. No hydronephrosis. Large right renal cyst measuring up to 7.7 x 8.1 cm. Left Kidney: Renal measurements: 8.9 x 6.3 x 5.0 cm = volume: 148.3 mL. Increased renal cortical echogenicity. Renal cortical thinning. Mild pelviectasis. Bladder: Appears normal for degree of bladder distention. Other: None. IMPRESSION: Mild left renal pelviectasis. Bilateral increased renal cortical echogenicity suggestive of chronic medical renal disease. Right renal cyst. Electronically Signed   By: Lovey Newcomer M.D.   On: 05/13/2022 09:44       Assessment &  Plan:   Problem List Items Addressed This Visit     CKD (chronic kidney disease), stage III ([place])    Continue to avoid antiinflammatories.  Continue ace inhibitor.  Follow metabolic panel.   Lab Results  Component Value Date   CREATININE 1.33 (H) 05/19/2022       Hypercholesterolemia    Continue pravastatin.  Low cholesterol diet and  exercise.  Follow lipid panel and liver function tests.        Relevant Medications   lisinopril (ZESTRIL) 20 MG tablet   Other Relevant Orders   Hepatic function panel   Lipid panel   Hypertension    Continues on amlodipine and spironolactone/hctz.  Has been off accupril - unable to get at pharmacy.  Blood pressures as outlined.  Restart ace inhibitor - start lisinopril 63m q day.       Relevant Medications   lisinopril (ZESTRIL) 20 MG tablet   Hypothyroidism    On thyroid replacement.  Follow tsh.       Stress    Overall appears to be handling things relatively well. Follow.       Type 2 diabetes mellitus with hyperglycemia (HCC)    Lab Results  Component Value Date   HGBA1C 8.2 (H) 04/28/2022  discussed increased a1c.  Discussed diet and exercise.  Has increased water intake.  Going to try to exercise more.  Low carb diet and exercise.  Follow met b and a1c.       Relevant Medications   lisinopril (ZESTRIL) 20 MG tablet   Other Relevant Orders   Hemoglobin AQ2I  Basic metabolic panel   Microalbumin / creatinine urine ratio     CEinar Pheasant MD

## 2022-05-28 ENCOUNTER — Encounter: Payer: Self-pay | Admitting: Internal Medicine

## 2022-05-28 NOTE — Assessment & Plan Note (Signed)
Continue pravastatin.  Low cholesterol diet and exercise.  Follow lipid panel and liver function tests.   

## 2022-05-28 NOTE — Assessment & Plan Note (Signed)
Lab Results  Component Value Date   HGBA1C 8.2 (H) 04/28/2022  discussed increased a1c.  Discussed diet and exercise.  Has increased water intake.  Going to try to exercise more.  Low carb diet and exercise.  Follow met b and a1c.

## 2022-05-28 NOTE — Assessment & Plan Note (Signed)
Continue to avoid antiinflammatories.  Continue ace inhibitor.  Follow metabolic panel.   Lab Results  Component Value Date   CREATININE 1.33 (H) 05/19/2022

## 2022-05-28 NOTE — Assessment & Plan Note (Signed)
Overall appears to be handling things relatively well.  Follow.   

## 2022-05-28 NOTE — Assessment & Plan Note (Signed)
On thyroid replacement.  Follow tsh.  

## 2022-05-28 NOTE — Assessment & Plan Note (Signed)
Continues on amlodipine and spironolactone/hctz.  Has been off accupril - unable to get at pharmacy.  Blood pressures as outlined.  Restart ace inhibitor - start lisinopril 20mg  q day.

## 2022-05-30 DIAGNOSIS — H401121 Primary open-angle glaucoma, left eye, mild stage: Secondary | ICD-10-CM | POA: Diagnosis not present

## 2022-05-31 ENCOUNTER — Ambulatory Visit: Payer: Medicare Other | Admitting: Urology

## 2022-05-31 ENCOUNTER — Encounter: Payer: Self-pay | Admitting: Urology

## 2022-05-31 VITALS — BP 147/76 | HR 60 | Ht 60.0 in | Wt 197.0 lb

## 2022-05-31 DIAGNOSIS — N1832 Chronic kidney disease, stage 3b: Secondary | ICD-10-CM

## 2022-05-31 DIAGNOSIS — N2889 Other specified disorders of kidney and ureter: Secondary | ICD-10-CM | POA: Diagnosis not present

## 2022-05-31 DIAGNOSIS — N1330 Unspecified hydronephrosis: Secondary | ICD-10-CM

## 2022-05-31 DIAGNOSIS — N289 Disorder of kidney and ureter, unspecified: Secondary | ICD-10-CM | POA: Diagnosis not present

## 2022-05-31 DIAGNOSIS — R3915 Urgency of urination: Secondary | ICD-10-CM | POA: Diagnosis not present

## 2022-05-31 NOTE — Progress Notes (Unsigned)
05/31/2022 9:16 AM   Lorraine Foley 18-Jan-1941 [id number]  Referring provider: Einar Pheasant, [place] [address] [place],  [place] [id number]  No chief complaint on file.   HPI: 81 year old female referred for further evaluation of her left hydronephrosis in the setting of stage IIIb CKD.  As part of evaluation for chronic kidney disease, she underwent a renal ultrasound.  This demonstrated a simple right-sided renal cyst as well as left pelviectasis.  I personally reviewed the renal ultrasound from 05/11/2022 and agree with the radiologic interpretation.  I also went back and reviewed imaging dating back to 2008 where pelviectasis, trace hydronephrosis on the left side was also noted.  She had a CT of the abdomen pelvis from 2009 indicating a left-sided extrarenal pelvis but no overt hydronephrosis.  She denies any flank pain or urinary issues.  She occasionally feels that when she stands up, she urgently has to go to the bathroom, this happens about 3 times a week especially in the morning time.  Did not have an episode of incontinence yet but is concerned that it could happen.    PMH: Past Medical History:  Diagnosis Date   Arthritis    knees   Diabetes mellitus ([place])    GERD (gastroesophageal reflux disease)    Glaucoma    Hypercholesterolemia    Hypertension    Hypothyroidism    Pneumonia     Surgical History: Past Surgical History:  Procedure Laterality Date   CATARACT EXTRACTION W/PHACO Left 11/09/2016   Procedure: CATARACT EXTRACTION PHACO AND INTRAOCULAR LENS PLACEMENT ([place])  left diabetic complicated;  Surgeon: Leandrew Koyanagi, MD;  Location: [facility];  Service: Ophthalmology;  Laterality: Left;  Diabetic - oral meds   CATARACT EXTRACTION W/PHACO Right 03/08/2017   Procedure: CATARACT EXTRACTION PHACO AND INTRAOCULAR LENS PLACEMENT ([place])  Right Diabetic complicated;  Surgeon: Leandrew Koyanagi, MD;  Location: [facility];   Service: Ophthalmology;  Laterality: Right;   EYE SURGERY Bilateral 08/2014, 2.2016   laser surgery in preparation for glaucoma surgery   KNEE ARTHROPLASTY Left 08/06/2018   Procedure: COMPUTER ASSISTED TOTAL KNEE ARTHROPLASTY;  Surgeon: Dereck Leep, MD;  Location: [facility];  Service: Orthopedics;  Laterality: Left;   TOTAL HIP ARTHROPLASTY  5/13   right    Home Medications:  Allergies as of 05/31/2022       Reactions   Metformin And Related Other (See Comments)   Swelling of the lips        Medication List        Accurate as of May 31, 2022 11:59 PM. If you have any questions, ask your nurse or doctor.          acetaminophen 650 MG CR tablet Commonly known as: TYLENOL Take 650 mg by mouth daily as needed for pain.   amLODipine 5 MG tablet Commonly known as: NORVASC TAKE 1 TABLET BY MOUTH  TWICE DAILY   ascorbic acid 500 MG tablet Commonly known as: VITAMIN C Take 500 mg by mouth daily.   aspirin EC 81 MG tablet Take 81 mg by mouth daily. Swallow whole.   blood glucose meter kit and supplies Dispense based on patient and insurance preference. Use up to four times daily as directed. (FOR ICD-10 E10.9, E11.9).   cloNIDine 0.1 MG tablet Commonly known as: CATAPRES TAKE 1 TABLET(0.1 MG) BY MOUTH THREE TIMES DAILY   Contour Next Test test strip Generic drug: glucose blood Used to check blood sugar twice daily.  glucosamine-chondroitin 500-400 MG tablet Take 1 tablet by mouth 3 (three) times daily.   Jardiance 25 MG Tabs tablet Generic drug: empagliflozin TAKE 1 TABLET(25 MG) BY MOUTH DAILY BEFORE BREAKFAST   levothyroxine 100 MCG tablet Commonly known as: SYNTHROID TAKE 1 TABLET(100 MCG) BY MOUTH DAILY   lisinopril 20 MG tablet Commonly known as: ZESTRIL Take 1 tablet (20 mg total) by mouth daily.   onetouch ultrasoft lancets Use as instructed to check blood sugars once daily. Dx E11.9   Rocklatan 0.02-0.005 % Soln Generic drug:  Netarsudil-Latanoprost Apply 1 drop to eye at bedtime.   rosuvastatin 20 MG tablet Commonly known as: CRESTOR TAKE 1 TABLET(20 MG) BY MOUTH DAILY   spironolactone-hydrochlorothiazide 25-25 MG tablet Commonly known as: ALDACTAZIDE TAKE 1 TABLET BY MOUTH DAILY   timolol 0.25 % ophthalmic solution Commonly known as: TIMOPTIC Place 1 drop into both eyes 2 (two) times daily.   Turmeric 500 MG Caps Take 1 capsule by mouth daily.   Vitamin D-3 25 MCG (1000 UT) Caps Take 1 capsule by mouth daily.   vitamin E 200 UNIT capsule Take 200 Units by mouth daily.        Allergies:  Allergies  Allergen Reactions   Metformin And Related Other (See Comments)    Swelling of the lips    Family History: Family History  Problem Relation Age of Onset   Mental illness Father        suicide   Liver disease Father        alcohol   Glaucoma Father    Alcohol abuse Father    Blindness Father    Glaucoma Mother    Osteoporosis Mother    Diabetes Sister    Breast cancer Sister 34   Colon cancer Neg Hx     Social History:  reports that she has never smoked. She has never used smokeless tobacco. She reports that she does not drink alcohol and does not use drugs.   Physical Exam: BP (!) 147/76   Pulse 60   Ht 5' (1.524 m)   Wt 197 lb (89.4 kg)   LMP 08/11/1995   BMI 38.47 kg/m   Constitutional:  Alert and oriented, No acute distress.  Accompanied today by her member. HEENT: [place] AT, moist mucus membranes.  Trachea midline, no masses. Cardiovascular: No clubbing, cyanosis, or edema. Neurologic: Grossly intact, no focal deficits, moving all 4 extremities. Psychiatric: Normal mood and affect.  Laboratory Data: Lab Results  Component Value Date   WBC 6.4 01/11/2022   HGB 13.4 01/11/2022   HCT 39.6 01/11/2022   MCV 88.2 01/11/2022   PLT 235.0 01/11/2022    Lab Results  Component Value Date   CREATININE 1.33 (H) 05/19/2022    Lab Results  Component Value Date   HGBA1C 8.2  (H) 04/28/2022    Pertinent Imaging: US Renal  Narrative CLINICAL DATA:  Chronic renal disease  EXAM: RENAL / URINARY TRACT ULTRASOUND COMPLETE  COMPARISON:  CT abdomen pelvis November 09, 2007  FINDINGS: Right Kidney:  Renal measurements: 9.7 x 3.6 x 4.7 cm = volume: 84.8 mL. Increased renal cortical echogenicity. Renal cortical thinning. No hydronephrosis. Large right renal cyst measuring up to 7.7 x 8.1 cm.  Left Kidney:  Renal measurements: 8.9 x 6.3 x 5.0 cm = volume: 148.3 mL. Increased renal cortical echogenicity. Renal cortical thinning. Mild pelviectasis.  Bladder:  Appears normal for degree of bladder distention.  Other:  None.  IMPRESSION: Mild left renal pelviectasis.  Bilateral increased  renal cortical echogenicity suggestive of chronic medical renal disease.  Right renal cyst.   Electronically Signed By: Lovey Newcomer M.D. On: 05/13/2022 09:44  See above   Assessment & Plan:    1. Pelviectasis I reviewed her most recent as well as historical imaging today.  The pelviectasis on the left is unchanged in likely represents a normal anatomic variant of a left extrarenal pelvis.  I do not think this is contributing to her CKD and would recommend no further surveillance or intervention based on this finding.  She understands and is agreeable this plan.  2. Renal insufficiency Unrelated to #1  3. Urinary urgency We discussed behavioral modification as well as the option for pharmacal intervention.  She is not interested at this time  Follow-up as needed    Hollice Espy, MD  [facility] [address] [place], [place] [address] [phone]

## 2022-06-01 ENCOUNTER — Telehealth: Payer: Self-pay | Admitting: *Deleted

## 2022-06-01 NOTE — Addendum Note (Signed)
Addended by: Verlin Grills on: 06/01/2022 03:13 PM   Modules accepted: Orders, Level of Service

## 2022-06-01 NOTE — Telephone Encounter (Signed)
This encounter was created in error - please disregard.

## 2022-06-17 ENCOUNTER — Ambulatory Visit
Admission: RE | Admit: 2022-06-17 | Discharge: 2022-06-17 | Disposition: A | Discharge status: Home / Self Care | Payer: Medicare Other | Source: Ambulatory Visit | Attending: Internal Medicine | Admitting: Internal Medicine

## 2022-06-17 DIAGNOSIS — Z1231 Encounter for screening mammogram for malignant neoplasm of breast: Secondary | ICD-10-CM | POA: Diagnosis not present

## 2022-06-18 ENCOUNTER — Other Ambulatory Visit: Payer: Self-pay | Admitting: Internal Medicine

## 2022-06-18 DIAGNOSIS — E1165 Type 2 diabetes mellitus with hyperglycemia: Secondary | ICD-10-CM

## 2022-07-01 ENCOUNTER — Other Ambulatory Visit: Payer: Self-pay | Admitting: Family

## 2022-08-14 ENCOUNTER — Other Ambulatory Visit: Payer: Self-pay | Admitting: Internal Medicine

## 2022-08-15 ENCOUNTER — Other Ambulatory Visit: Payer: Medicare Other

## 2022-08-18 ENCOUNTER — Ambulatory Visit: Payer: Medicare Other | Admitting: Internal Medicine

## 2022-08-30 ENCOUNTER — Ambulatory Visit: Payer: Medicare Other | Admitting: Internal Medicine

## 2022-08-31 ENCOUNTER — Ambulatory Visit: Payer: Medicare Other | Admitting: Internal Medicine

## 2022-10-12 ENCOUNTER — Ambulatory Visit (INDEPENDENT_AMBULATORY_CARE_PROVIDER_SITE_OTHER): Payer: Medicare Other | Admitting: Internal Medicine

## 2022-10-12 ENCOUNTER — Encounter: Payer: Self-pay | Admitting: Internal Medicine

## 2022-10-12 VITALS — BP 148/78 | HR 60 | Temp 97.9°F | Resp 16 | Ht 60.0 in | Wt 194.8 lb

## 2022-10-12 DIAGNOSIS — F439 Reaction to severe stress, unspecified: Secondary | ICD-10-CM

## 2022-10-12 DIAGNOSIS — E7800 Pure hypercholesterolemia, unspecified: Secondary | ICD-10-CM | POA: Diagnosis not present

## 2022-10-12 DIAGNOSIS — E039 Hypothyroidism, unspecified: Secondary | ICD-10-CM

## 2022-10-12 DIAGNOSIS — N1832 Chronic kidney disease, stage 3b: Secondary | ICD-10-CM

## 2022-10-12 DIAGNOSIS — I10 Essential (primary) hypertension: Secondary | ICD-10-CM

## 2022-10-12 DIAGNOSIS — N2889 Other specified disorders of kidney and ureter: Secondary | ICD-10-CM

## 2022-10-12 DIAGNOSIS — E1165 Type 2 diabetes mellitus with hyperglycemia: Secondary | ICD-10-CM | POA: Diagnosis not present

## 2022-10-12 LAB — HEPATIC FUNCTION PANEL
ALT: 15 U/L (ref 0–35)
AST: 15 U/L (ref 0–37)
Albumin: 4.3 g/dL (ref 3.5–5.2)
Alkaline Phosphatase: 47 U/L (ref 39–117)
Bilirubin, Direct: 0.1 mg/dL (ref 0.0–0.3)
Total Bilirubin: 0.7 mg/dL (ref 0.2–1.2)
Total Protein: 7.2 g/dL (ref 6.0–8.3)

## 2022-10-12 LAB — BASIC METABOLIC PANEL
BUN: 39 mg/dL — ABNORMAL HIGH (ref 6–23)
CO2: 33 mEq/L — ABNORMAL HIGH (ref 19–32)
Calcium: 10.2 mg/dL (ref 8.4–10.5)
Chloride: 99 mEq/L (ref 96–112)
Creatinine, Ser: 1.28 mg/dL — ABNORMAL HIGH (ref 0.40–1.20)
GFR: 39.22 mL/min — ABNORMAL LOW (ref >60.00)
Glucose, Bld: 151 mg/dL — ABNORMAL HIGH (ref 70–99)
Potassium: 5 mEq/L (ref 3.5–5.1)
Sodium: 137 mEq/L (ref 135–145)

## 2022-10-12 LAB — LIPID PANEL
Cholesterol: 146 mg/dL (ref 0–200)
HDL: 43.5 mg/dL (ref >39.00)
LDL Cholesterol: 75 mg/dL (ref 0–99)
NonHDL: 102.73
Total CHOL/HDL Ratio: 3
Triglycerides: 137 mg/dL (ref 0.0–149.0)
VLDL: 27.4 mg/dL (ref 0.0–40.0)

## 2022-10-12 LAB — HEMOGLOBIN A1C: Hgb A1c MFr Bld: 8.1 % — ABNORMAL HIGH (ref 4.6–6.5)

## 2022-10-12 LAB — MICROALBUMIN / CREATININE URINE RATIO
Creatinine,U: 20.6 mg/dL
Microalb Creat Ratio: 5 mg/g (ref 0.0–30.0)
Microalb, Ur: 1 mg/dL (ref 0.0–1.9)

## 2022-10-12 MED ORDER — AMLODIPINE BESYLATE 5 MG PO TABS: 5.0000 mg | ORAL_TABLET | Freq: Two times a day (BID) | ORAL | 3 refills | Status: DC

## 2022-10-12 MED ORDER — EMPAGLIFLOZIN 25 MG PO TABS: ORAL_TABLET | ORAL | 1 refills | Status: DC

## 2022-10-12 MED ORDER — LEVOTHYROXINE SODIUM 100 MCG PO TABS: ORAL_TABLET | ORAL | 1 refills | Status: DC

## 2022-10-12 MED ORDER — LISINOPRIL 20 MG PO TABS: 20.0000 mg | ORAL_TABLET | Freq: Every day | ORAL | 1 refills | Status: DC

## 2022-10-12 NOTE — Assessment & Plan Note (Signed)
Saw Dr Erlene Quan - pelviectasis on the left is unchanged in likely represents a normal anatomic variant of a left extrarenal pelvis. I do not think this is contributing to her CKD and would recommend no further surveillance or intervention based on this finding.

## 2022-10-12 NOTE — Progress Notes (Signed)
Subjective:    Patient ID: Lorraine Foley, female    DOB: 1941-07-26, 82 y.o.   MRN: [id number]  Patient here for  Chief Complaint  Patient presents with   Medical Management of Chronic Issues    HPI Here to follow up regarding hypercholesterolemia, diabetes and hypertension. Has been in [place] visiting her daughter. Has been going up and down stairs a lot.  Trying to watch her diet.  Quinapril changed to lisinopril last visit.  Blood pressure has been overall doing better.  No chest pain or sob reported.  No abdominal pain.  Bowel moving.  Has been off jardiance since 12/18.  Not able to get refill.  Overall she feels things are stable.    Past Medical History:  Diagnosis Date   Arthritis    knees   Diabetes mellitus (HCC)    GERD (gastroesophageal reflux disease)    Glaucoma    Hypercholesterolemia    Hypertension    Hypothyroidism    Pneumonia    Past Surgical History:  Procedure Laterality Date   CATARACT EXTRACTION W/PHACO Left 11/09/2016   Procedure: CATARACT EXTRACTION PHACO AND INTRAOCULAR LENS PLACEMENT ([place])  left diabetic complicated;  Surgeon: Leandrew Koyanagi, MD;  Location: [place] [facility];  Service: Ophthalmology;  Laterality: Left;  Diabetic - oral meds   CATARACT EXTRACTION W/PHACO Right 03/08/2017   Procedure: CATARACT EXTRACTION PHACO AND INTRAOCULAR LENS PLACEMENT ([place])  Right Diabetic complicated;  Surgeon: Leandrew Koyanagi, MD;  Location: [facility];  Service: Ophthalmology;  Laterality: Right;   EYE SURGERY Bilateral 08/2014, 2.2016   laser surgery in preparation for glaucoma surgery   KNEE ARTHROPLASTY Left 08/06/2018   Procedure: COMPUTER ASSISTED TOTAL KNEE ARTHROPLASTY;  Surgeon: Dereck Leep, MD;  Location: [facility];  Service: Orthopedics;  Laterality: Left;   TOTAL HIP ARTHROPLASTY  5/13   right   Family History  Problem Relation Age of Onset   Mental illness Father        suicide   Liver disease Father        alcohol    Glaucoma Father    Alcohol abuse Father    Blindness Father    Glaucoma Mother    Osteoporosis Mother    Diabetes Sister    Breast cancer Sister 44   Colon cancer Neg Hx    Social History   Socioeconomic History   Marital status: Divorced    Spouse name: Not on file   Number of children: 1   Years of education: Not on file   Highest education level: Not on file  Occupational History   Not on file  Tobacco Use   Smoking status: Never   Smokeless tobacco: Never  Vaping Use   Vaping Use: Never used  Substance and Sexual Activity   Alcohol use: No    Alcohol/week: 0.0 standard drinks of alcohol   Drug use: No   Sexual activity: Not on file  Other Topics Concern   Not on file  Social History Narrative   Not on file   Social Determinants of Health   Financial Resource Strain: Low Risk  (03/02/2022)   Overall Financial Resource Strain (CARDIA)    Difficulty of Paying Living Expenses: Not hard at all  Food Insecurity: No Food Insecurity (03/02/2022)   Hunger Vital Sign    Worried About Running Out of Food in the Last Year: Never true    Ran Out of Food in the Last Year: Never true  Transportation Needs:  No Transportation Needs (03/02/2022)   PRAPARE - Hydrologist (Medical): No    Lack of Transportation (Non-Medical): No  Physical Activity: Not on file  Stress: No Stress Concern Present (03/02/2022)   [place]    Feeling of Stress : Not at all  Social Connections: Unknown (03/02/2022)   Social Connection and Isolation Panel [NHANES]    Frequency of Communication with Friends and Family: More than three times a week    Frequency of Social Gatherings with Friends and Family: More than three times a week    Attends Religious Services: Not on Advertising copywriter or Organizations: Not on file    Attends Archivist Meetings: Not on file    Marital Status: Not on file      Review of Systems  Constitutional:  Negative for appetite change and unexpected weight change.  HENT:  Negative for congestion and sinus pressure.   Respiratory:  Negative for cough, chest tightness and shortness of breath.   Cardiovascular:  Negative for chest pain, palpitations and leg swelling.  Gastrointestinal:  Negative for abdominal pain, diarrhea, nausea and vomiting.  Genitourinary:  Negative for difficulty urinating and dysuria.  Musculoskeletal:  Negative for joint swelling and myalgias.  Skin:  Negative for color change and rash.  Neurological:  Negative for dizziness and headaches.  Psychiatric/Behavioral:  Negative for agitation and dysphoric mood.        Objective:     BP (!) 148/78   Pulse 60   Temp 97.9 F (36.6 C)   Resp 16   Ht 5' (1.524 m)   Wt 194 lb 12.8 oz (88.4 kg)   LMP 08/11/1995   SpO2 99%   BMI 38.04 kg/m  Wt Readings from Last 3 Encounters:  10/12/22 194 lb 12.8 oz (88.4 kg)  05/31/22 197 lb (89.4 kg)  05/19/22 195 lb 9.6 oz (88.7 kg)    Physical Exam Vitals reviewed.  Constitutional:      General: She is not in acute distress.    Appearance: Normal appearance.  HENT:     Head: Normocephalic and atraumatic.     Right Ear: External ear normal.     Left Ear: External ear normal.  Eyes:     General: No scleral icterus.       Right eye: No discharge.        Left eye: No discharge.     Conjunctiva/sclera: Conjunctivae normal.  Neck:     Thyroid: No thyromegaly.  Cardiovascular:     Rate and Rhythm: Normal rate and regular rhythm.  Pulmonary:     Effort: No respiratory distress.     Breath sounds: Normal breath sounds. No wheezing.  Abdominal:     General: Bowel sounds are normal.     Palpations: Abdomen is soft.     Tenderness: There is no abdominal tenderness.  Musculoskeletal:        General: No swelling or tenderness.     Cervical back: Neck supple. No tenderness.  Lymphadenopathy:     Cervical: No cervical adenopathy.   Skin:    Findings: No erythema or rash.  Neurological:     Mental Status: She is alert.  Psychiatric:        Mood and Affect: Mood normal.        Behavior: Behavior normal.      Outpatient Encounter Medications as of 10/12/2022  Medication Sig  acetaminophen (TYLENOL) 650 MG CR tablet Take 650 mg by mouth daily as needed for pain.   amLODipine (NORVASC) 5 MG tablet Take 1 tablet (5 mg total) by mouth 2 (two) times daily.   aspirin EC 81 MG tablet Take 81 mg by mouth daily. Swallow whole.   blood glucose meter kit and supplies Dispense based on patient and insurance preference. Use up to four times daily as directed. (FOR ICD-10 E10.9, E11.9).   Cholecalciferol (VITAMIN D-3) 1000 UNITS CAPS Take 1 capsule by mouth daily.   cloNIDine (CATAPRES) 0.1 MG tablet TAKE 1 TABLET(0.1 MG) BY MOUTH THREE TIMES DAILY   empagliflozin (JARDIANCE) 25 MG TABS tablet TAKE 1 TABLET(25 MG) BY MOUTH DAILY BEFORE BREAKFAST   glucosamine-chondroitin 500-400 MG tablet Take 1 tablet by mouth 3 (three) times daily.   glucose blood (CONTOUR NEXT TEST) test strip Used to check blood sugar twice daily.   Lancets (ONETOUCH ULTRASOFT) lancets Use as instructed to check blood sugars once daily. Dx E11.9   levothyroxine (SYNTHROID) 100 MCG tablet TAKE 1 TABLET(100 MCG) BY MOUTH DAILY   lisinopril (ZESTRIL) 20 MG tablet Take 1 tablet (20 mg total) by mouth daily.   ROCKLATAN 0.02-0.005 % SOLN Apply 1 drop to eye at bedtime.   rosuvastatin (CRESTOR) 20 MG tablet TAKE 1 TABLET(20 MG) BY MOUTH DAILY   spironolactone-hydrochlorothiazide (ALDACTAZIDE) 25-25 MG tablet TAKE 1 TABLET BY MOUTH DAILY   timolol (TIMOPTIC) 0.25 % ophthalmic solution Place 1 drop into both eyes 2 (two) times daily.   Turmeric 500 MG CAPS Take 1 capsule by mouth daily.   vitamin C (ASCORBIC ACID) 500 MG tablet Take 500 mg by mouth daily.   vitamin E 200 UNIT capsule Take 200 Units by mouth daily.   [DISCONTINUED] amLODipine (NORVASC) 5 MG tablet  TAKE 1 TABLET BY MOUTH  TWICE DAILY   [DISCONTINUED] JARDIANCE 25 MG TABS tablet TAKE 1 TABLET(25 MG) BY MOUTH DAILY BEFORE BREAKFAST   [DISCONTINUED] levothyroxine (SYNTHROID) 100 MCG tablet TAKE 1 TABLET(100 MCG) BY MOUTH DAILY   [DISCONTINUED] lisinopril (ZESTRIL) 20 MG tablet Take 1 tablet (20 mg total) by mouth daily.   No facility-administered encounter medications on file as of 10/12/2022.     Lab Results  Component Value Date   WBC 6.4 01/11/2022   HGB 13.4 01/11/2022   HCT 39.6 01/11/2022   PLT 235.0 01/11/2022   GLUCOSE 151 (H) 10/12/2022   CHOL 146 10/12/2022   TRIG 137.0 10/12/2022   HDL 43.50 10/12/2022   LDLDIRECT 80.0 04/28/2022   LDLCALC 75 10/12/2022   ALT 15 10/12/2022   AST 15 10/12/2022   NA 137 10/12/2022   K 5.0 10/12/2022   CL 99 10/12/2022   CREATININE 1.28 (H) 10/12/2022   BUN 39 (H) 10/12/2022   CO2 33 (H) 10/12/2022   TSH 0.83 01/11/2022   INR 1.00 07/24/2018   HGBA1C 8.1 (H) 10/12/2022   MICROALBUR 1.0 10/12/2022    MM 3D SCREEN BREAST BILATERAL  Result Date: 06/21/2022 CLINICAL DATA:  Screening. EXAM: DIGITAL SCREENING BILATERAL MAMMOGRAM WITH TOMOSYNTHESIS AND CAD TECHNIQUE: Bilateral screening digital craniocaudal and mediolateral oblique mammograms were obtained. Bilateral screening digital breast tomosynthesis was performed. The images were evaluated with computer-aided detection. COMPARISON:  Previous exam(s). ACR Breast Density Category b: There are scattered areas of fibroglandular density. FINDINGS: There are no findings suspicious for malignancy. IMPRESSION: No mammographic evidence of malignancy. A result letter of this screening mammogram will be mailed directly to the patient. RECOMMENDATION: Screening mammogram in  one year. (Code:SM-B-01Y) BI-RADS CATEGORY  1: Negative. Electronically Signed   By: Valentino Saxon M.D.   On: 06/21/2022 12:56       Assessment & Plan:  Pelviectasis Assessment & Plan: Saw Dr Erlene Quan - pelviectasis on  the left is unchanged in likely represents a normal anatomic variant of a left extrarenal pelvis. I do not think this is contributing to her CKD and would recommend no further surveillance or intervention based on this finding.    Type 2 diabetes mellitus with hyperglycemia, without long-term current use of insulin (HCC) Assessment & Plan: Discussed diet and exercise.  Low carb diet.  She has been watching diet.  More active - going up and down stairs.  Has bene out of jardiance for one month.  Restart.  Follow met b and a1c.    Orders: -     Microalbumin / creatinine urine ratio -     Basic metabolic panel -     Hemoglobin A1c  Hypercholesterolemia Assessment & Plan: Continue pravastatin.  Low cholesterol diet and exercise.  Follow lipid panel and liver function tests.    Orders: -     Lipid panel -     Hepatic function panel  Stage 3b chronic kidney disease ([place]) Assessment & Plan: Continue to avoid antiinflammatories.  Continue ace inhibitor.  Follow metabolic panel.   Lab Results  Component Value Date   CREATININE 1.28 (H) 10/12/2022     Primary hypertension Assessment & Plan: Continues on amlodipine and spironolactone/hctz and now lisinopril 90m q day.  Follow pressures.  Follow metabolic panel.    Hypothyroidism, unspecified type Assessment & Plan: On thyroid replacement.  Follow tsh.    Stress Assessment & Plan: Overall appears to be handling things relatively well. Follow.    Other orders -     amLODIPine Besylate; Take 1 tablet (5 mg total) by mouth 2 (two) times daily.  Dispense: 180 tablet; Refill: 3 -     Empagliflozin; TAKE 1 TABLET(25 MG) BY MOUTH DAILY BEFORE BREAKFAST  Dispense: 90 tablet; Refill: 1 -     Levothyroxine Sodium; TAKE 1 TABLET(100 MCG) BY MOUTH DAILY  Dispense: 90 tablet; Refill: 1 -     Lisinopril; Take 1 tablet (20 mg total) by mouth daily.  Dispense: 90 tablet; Refill: 1     CEinar Pheasant MD

## 2022-10-16 ENCOUNTER — Encounter: Payer: Self-pay | Admitting: Internal Medicine

## 2022-10-16 NOTE — Assessment & Plan Note (Signed)
On thyroid replacement.  Follow tsh.  

## 2022-10-16 NOTE — Assessment & Plan Note (Signed)
Overall appears to be handling things relatively well.  Follow.   

## 2022-10-16 NOTE — Assessment & Plan Note (Signed)
Continues on amlodipine and spironolactone/hctz and now lisinopril 34m q day.  Follow pressures.  Follow metabolic panel.

## 2022-10-16 NOTE — Assessment & Plan Note (Signed)
Discussed diet and exercise.  Low carb diet.  She has been watching diet.  More active - going up and down stairs.  Has bene out of jardiance for one month.  Restart.  Follow met b and a1c.

## 2022-10-16 NOTE — Assessment & Plan Note (Signed)
Continue to avoid antiinflammatories.  Continue ace inhibitor.  Follow metabolic panel.   Lab Results  Component Value Date   CREATININE 1.28 (H) 10/12/2022

## 2022-10-16 NOTE — Assessment & Plan Note (Signed)
Discussed increased a1c.  Discussed diet and exercise.  Has increased water intake.  Going to try to exercise more.  Low carb diet and exercise.  Follow met b and a1c. Has been off jardiance.  Restart.

## 2022-10-16 NOTE — Assessment & Plan Note (Signed)
Continue pravastatin.  Low cholesterol diet and exercise.  Follow lipid panel and liver function tests.

## 2022-10-18 DIAGNOSIS — Z96652 Presence of left artificial knee joint: Secondary | ICD-10-CM | POA: Diagnosis not present

## 2022-10-18 DIAGNOSIS — Z96641 Presence of right artificial hip joint: Secondary | ICD-10-CM | POA: Diagnosis not present

## 2022-10-19 DIAGNOSIS — E039 Hypothyroidism, unspecified: Secondary | ICD-10-CM | POA: Diagnosis not present

## 2022-10-19 DIAGNOSIS — E1122 Type 2 diabetes mellitus with diabetic chronic kidney disease: Secondary | ICD-10-CM | POA: Diagnosis not present

## 2022-10-19 DIAGNOSIS — N1832 Chronic kidney disease, stage 3b: Secondary | ICD-10-CM | POA: Diagnosis not present

## 2022-10-19 DIAGNOSIS — I10 Essential (primary) hypertension: Secondary | ICD-10-CM | POA: Diagnosis not present

## 2022-10-20 LAB — LAB REPORT - SCANNED
Albumin, Urine POC: 10.9
Creatinine, POC: 16.1 mg/dL
Microalb Creat Ratio: 68
Protein/Creatinine Ratio: 311

## 2022-10-25 ENCOUNTER — Other Ambulatory Visit: Payer: Self-pay | Admitting: Internal Medicine

## 2022-11-07 ENCOUNTER — Other Ambulatory Visit: Payer: Self-pay | Admitting: Internal Medicine

## 2022-12-11 ENCOUNTER — Other Ambulatory Visit: Payer: Self-pay | Admitting: Internal Medicine

## 2022-12-12 DIAGNOSIS — H401121 Primary open-angle glaucoma, left eye, mild stage: Secondary | ICD-10-CM | POA: Diagnosis not present

## 2022-12-19 DIAGNOSIS — E119 Type 2 diabetes mellitus without complications: Secondary | ICD-10-CM | POA: Diagnosis not present

## 2022-12-19 DIAGNOSIS — Z961 Presence of intraocular lens: Secondary | ICD-10-CM | POA: Diagnosis not present

## 2022-12-19 DIAGNOSIS — H401112 Primary open-angle glaucoma, right eye, moderate stage: Secondary | ICD-10-CM | POA: Diagnosis not present

## 2022-12-19 DIAGNOSIS — H401121 Primary open-angle glaucoma, left eye, mild stage: Secondary | ICD-10-CM | POA: Diagnosis not present

## 2022-12-19 LAB — HM DIABETES EYE EXAM

## 2023-01-10 ENCOUNTER — Encounter: Payer: Self-pay | Admitting: Internal Medicine

## 2023-01-10 ENCOUNTER — Ambulatory Visit (INDEPENDENT_AMBULATORY_CARE_PROVIDER_SITE_OTHER): Payer: Medicare Other | Admitting: Internal Medicine

## 2023-01-10 VITALS — BP 132/70 | HR 60 | Temp 97.9°F | Resp 16 | Ht 60.0 in | Wt 200.0 lb

## 2023-01-10 DIAGNOSIS — F439 Reaction to severe stress, unspecified: Secondary | ICD-10-CM

## 2023-01-10 DIAGNOSIS — E7800 Pure hypercholesterolemia, unspecified: Secondary | ICD-10-CM

## 2023-01-10 DIAGNOSIS — E039 Hypothyroidism, unspecified: Secondary | ICD-10-CM | POA: Diagnosis not present

## 2023-01-10 DIAGNOSIS — R001 Bradycardia, unspecified: Secondary | ICD-10-CM

## 2023-01-10 DIAGNOSIS — K219 Gastro-esophageal reflux disease without esophagitis: Secondary | ICD-10-CM

## 2023-01-10 DIAGNOSIS — E1165 Type 2 diabetes mellitus with hyperglycemia: Secondary | ICD-10-CM

## 2023-01-10 DIAGNOSIS — E559 Vitamin D deficiency, unspecified: Secondary | ICD-10-CM | POA: Diagnosis not present

## 2023-01-10 DIAGNOSIS — Z7984 Long term (current) use of oral hypoglycemic drugs: Secondary | ICD-10-CM | POA: Diagnosis not present

## 2023-01-10 DIAGNOSIS — I10 Essential (primary) hypertension: Secondary | ICD-10-CM

## 2023-01-10 DIAGNOSIS — N1832 Chronic kidney disease, stage 3b: Secondary | ICD-10-CM

## 2023-01-10 LAB — CBC WITH DIFFERENTIAL/PLATELET
Basophils Absolute: 0 10*3/uL (ref 0.0–0.1)
Basophils Relative: 0.7 % (ref 0.0–3.0)
Eosinophils Absolute: 0.3 10*3/uL (ref 0.0–0.7)
Eosinophils Relative: 4.2 % (ref 0.0–5.0)
HCT: 39.6 % (ref 36.0–46.0)
Hemoglobin: 13.4 g/dL (ref 12.0–15.0)
Lymphocytes Relative: 18.5 % (ref 12.0–46.0)
Lymphs Abs: 1.2 10*3/uL (ref 0.7–4.0)
MCHC: 33.8 g/dL (ref 30.0–36.0)
MCV: 89.2 fl (ref 78.0–100.0)
Monocytes Absolute: 0.5 10*3/uL (ref 0.1–1.0)
Monocytes Relative: 7.9 % (ref 3.0–12.0)
Neutro Abs: 4.6 10*3/uL (ref 1.4–7.7)
Neutrophils Relative %: 68.7 % (ref 43.0–77.0)
Platelets: 257 10*3/uL (ref 150.0–400.0)
RBC: 4.44 Mil/uL (ref 3.87–5.11)
RDW: 13.2 % (ref 11.5–15.5)
WBC: 6.7 10*3/uL (ref 4.0–10.5)

## 2023-01-10 LAB — BASIC METABOLIC PANEL
BUN: 40 mg/dL — ABNORMAL HIGH (ref 6–23)
CO2: 30 mEq/L (ref 19–32)
Calcium: 10.1 mg/dL (ref 8.4–10.5)
Chloride: 103 mEq/L (ref 96–112)
Creatinine, Ser: 1.45 mg/dL — ABNORMAL HIGH (ref 0.40–1.20)
GFR: 33.71 mL/min — ABNORMAL LOW (ref >60.00)
Glucose, Bld: 150 mg/dL — ABNORMAL HIGH (ref 70–99)
Potassium: 5.6 mEq/L — ABNORMAL HIGH (ref 3.5–5.1)
Sodium: 140 mEq/L (ref 135–145)

## 2023-01-10 LAB — LIPID PANEL
Cholesterol: 143 mg/dL (ref 0–200)
HDL: 45.2 mg/dL (ref >39.00)
LDL Cholesterol: 62 mg/dL (ref 0–99)
NonHDL: 97.4
Total CHOL/HDL Ratio: 3
Triglycerides: 179 mg/dL — ABNORMAL HIGH (ref 0.0–149.0)
VLDL: 35.8 mg/dL (ref 0.0–40.0)

## 2023-01-10 LAB — HEPATIC FUNCTION PANEL
ALT: 13 U/L (ref 0–35)
AST: 14 U/L (ref 0–37)
Albumin: 4.2 g/dL (ref 3.5–5.2)
Alkaline Phosphatase: 45 U/L (ref 39–117)
Bilirubin, Direct: 0.1 mg/dL (ref 0.0–0.3)
Total Bilirubin: 0.7 mg/dL (ref 0.2–1.2)
Total Protein: 7 g/dL (ref 6.0–8.3)

## 2023-01-10 LAB — HM DIABETES FOOT EXAM

## 2023-01-10 LAB — TSH: TSH: 0.87 u[IU]/mL (ref 0.35–5.50)

## 2023-01-10 LAB — VITAMIN D 25 HYDROXY (VIT D DEFICIENCY, FRACTURES): VITD: 26.67 ng/mL — ABNORMAL LOW (ref 30.00–100.00)

## 2023-01-10 LAB — HEMOGLOBIN A1C: Hgb A1c MFr Bld: 8.2 % — ABNORMAL HIGH (ref 4.6–6.5)

## 2023-01-10 MED ORDER — EMPAGLIFLOZIN 25 MG PO TABS: ORAL_TABLET | ORAL | 1 refills | Status: DC

## 2023-01-10 MED ORDER — LEVOTHYROXINE SODIUM 100 MCG PO TABS: ORAL_TABLET | ORAL | 1 refills | Status: DC

## 2023-01-10 MED ORDER — LISINOPRIL 20 MG PO TABS: 20.0000 mg | ORAL_TABLET | Freq: Every day | ORAL | 1 refills | Status: DC

## 2023-01-10 NOTE — Progress Notes (Signed)
Subjective:    Patient ID: Lorraine Foley, female    DOB: 05-18-1941, 82 y.o.   MRN: [id number]  Patient here for  Chief Complaint  Patient presents with   Medical Management of Chronic Issues    HPI Here to follow up regarding hypercholesterolemia, diabetes and hypertension. Reports she is doing relatively well.  Trying to monitor her diet.  Was walking. Plans to get back in a regular exercise routine.  Discussed diet and exercise.  Reviewed outside blood sugars.  Most averaging 140-190s.  No chest pain.  Breathing stable.  No increased cough or congestion.  No abdominal pain or bowel change reported.  Planning to go to [place] for a few weeks to visit her daughter.  Leaving next week.  Reviewed blood pressures and pulse (outside checks) - mostly averaging 120-140/60-70s.  Pulse recently in the 40-50s.  Not on beta blockers.  Uses timolol eye drops.  No change recently.  No dizziness or lightheadedness.  No syncope or near syncope.     Past Medical History:  Diagnosis Date   Arthritis    knees   Diabetes mellitus (HCC)    GERD (gastroesophageal reflux disease)    Glaucoma    Hypercholesterolemia    Hypertension    Hypothyroidism    Pneumonia    Past Surgical History:  Procedure Laterality Date   CATARACT EXTRACTION W/PHACO Left 11/09/2016   Procedure: CATARACT EXTRACTION PHACO AND INTRAOCULAR LENS PLACEMENT (IOC)  left diabetic complicated;  Surgeon: Lockie Mola, MD;  Location: [facility];  Service: Ophthalmology;  Laterality: Left;  Diabetic - oral meds   CATARACT EXTRACTION W/PHACO Right 03/08/2017   Procedure: CATARACT EXTRACTION PHACO AND INTRAOCULAR LENS PLACEMENT (IOC)  Right Diabetic complicated;  Surgeon: Lockie Mola, MD;  Location: [facility];  Service: Ophthalmology;  Laterality: Right;   EYE SURGERY Bilateral 08/2014, 2.2016   laser surgery in preparation for glaucoma surgery   KNEE ARTHROPLASTY Left 08/06/2018   Procedure: COMPUTER  ASSISTED TOTAL KNEE ARTHROPLASTY;  Surgeon: Donato Heinz, MD;  Location: [facility];  Service: Orthopedics;  Laterality: Left;   TOTAL HIP ARTHROPLASTY  5/13   right   Family History  Problem Relation Age of Onset   Mental illness Father        suicide   Liver disease Father        alcohol   Glaucoma Father    Alcohol abuse Father    Blindness Father    Glaucoma Mother    Osteoporosis Mother    Diabetes Sister    Breast cancer Sister 1   Colon cancer Neg Hx    Social History   Socioeconomic History   Marital status: Divorced    Spouse name: Not on file   Number of children: 1   Years of education: Not on file   Highest education level: Not on file  Occupational History   Not on file  Tobacco Use   Smoking status: Never   Smokeless tobacco: Never  Vaping Use   Vaping Use: Never used  Substance and Sexual Activity   Alcohol use: No    Alcohol/week: 0.0 standard drinks of alcohol   Drug use: No   Sexual activity: Not on file  Other Topics Concern   Not on file  Social History Narrative   Not on file   Social Determinants of Health   Financial Resource Strain: Low Risk  (03/02/2022)   Overall Financial Resource Strain (CARDIA)    Difficulty of  Paying Living Expenses: Not hard at all  Food Insecurity: No Food Insecurity (03/02/2022)   Hunger Vital Sign    Worried About Running Out of Food in the Last Year: Never true    Ran Out of Food in the Last Year: Never true  Transportation Needs: No Transportation Needs (03/02/2022)   PRAPARE - Administrator, Civil Service (Medical): No    Lack of Transportation (Non-Medical): No  Physical Activity: Not on file  Stress: No Stress Concern Present (03/02/2022)   [employer] of Occupational Health - Occupational Stress Questionnaire    Feeling of Stress : Not at all  Social Connections: Unknown (03/02/2022)   Social Connection and Isolation Panel [NHANES]    Frequency of Communication with Friends and Family:  More than three times a week    Frequency of Social Gatherings with Friends and Family: More than three times a week    Attends Religious Services: Not on Marketing executive or Organizations: Not on file    Attends Banker Meetings: Not on file    Marital Status: Not on file     Review of Systems  Constitutional:  Negative for appetite change and unexpected weight change.  HENT:  Negative for congestion and sinus pressure.   Respiratory:  Negative for cough and chest tightness.        Breathing stable.   Cardiovascular:  Negative for chest pain and palpitations.       No increased swelling.   Gastrointestinal:  Negative for abdominal pain, diarrhea, nausea and vomiting.  Genitourinary:  Negative for difficulty urinating and dysuria.  Musculoskeletal:  Negative for joint swelling and myalgias.  Skin:  Negative for color change and rash.  Neurological:  Negative for dizziness, light-headedness and headaches.  Psychiatric/Behavioral:  Negative for agitation and dysphoric mood.        Objective:     BP 132/70   Pulse 60   Temp 97.9 F (36.6 C)   Resp 16   Ht 5' (1.524 m)   Wt 200 lb (90.7 kg)   LMP 08/11/1995   SpO2 97%   BMI 39.06 kg/m  Wt Readings from Last 3 Encounters:  01/10/23 200 lb (90.7 kg)  10/12/22 194 lb 12.8 oz (88.4 kg)  05/31/22 197 lb (89.4 kg)    Physical Exam Vitals reviewed.  Constitutional:      General: She is not in acute distress.    Appearance: Normal appearance.  HENT:     Head: Normocephalic and atraumatic.     Right Ear: External ear normal.     Left Ear: External ear normal.  Eyes:     General: No scleral icterus.       Right eye: No discharge.        Left eye: No discharge.     Conjunctiva/sclera: Conjunctivae normal.  Neck:     Thyroid: No thyromegaly.  Cardiovascular:     Rate and Rhythm: Normal rate.     Comments: Ventricular rate 40.  Pulmonary:     Effort: No respiratory distress.     Breath  sounds: Normal breath sounds. No wheezing.  Abdominal:     General: Bowel sounds are normal.     Palpations: Abdomen is soft.     Tenderness: There is no abdominal tenderness.  Musculoskeletal:        General: No swelling or tenderness.     Cervical back: Neck supple. No tenderness.  Lymphadenopathy:  Cervical: No cervical adenopathy.  Skin:    Findings: No erythema or rash.  Neurological:     Mental Status: She is alert.  Psychiatric:        Mood and Affect: Mood normal.        Behavior: Behavior normal.      Outpatient Encounter Medications as of 01/10/2023  Medication Sig   acetaminophen (TYLENOL) 650 MG CR tablet Take 650 mg by mouth daily as needed for pain.   amLODipine (NORVASC) 5 MG tablet Take 1 tablet (5 mg total) by mouth 2 (two) times daily.   aspirin EC 81 MG tablet Take 81 mg by mouth daily. Swallow whole.   blood glucose meter kit and supplies Dispense based on patient and insurance preference. Use up to four times daily as directed. (FOR ICD-10 E10.9, E11.9).   Cholecalciferol (VITAMIN D-3) 1000 UNITS CAPS Take 1 capsule by mouth daily.   cloNIDine (CATAPRES) 0.1 MG tablet TAKE 1 TABLET(0.1 MG) BY MOUTH THREE TIMES DAILY   empagliflozin (JARDIANCE) 25 MG TABS tablet TAKE 1 TABLET(25 MG) BY MOUTH DAILY BEFORE BREAKFAST   glucosamine-chondroitin 500-400 MG tablet Take 1 tablet by mouth 3 (three) times daily.   glucose blood (CONTOUR NEXT TEST) test strip USE TWICE DAILY AS DIRECTED   Lancets (ONETOUCH ULTRASOFT) lancets Use as instructed to check blood sugars once daily. Dx E11.9   levothyroxine (SYNTHROID) 100 MCG tablet TAKE 1 TABLET(100 MCG) BY MOUTH DAILY   lisinopril (ZESTRIL) 20 MG tablet Take 1 tablet (20 mg total) by mouth daily.   ROCKLATAN 0.02-0.005 % SOLN Apply 1 drop to eye at bedtime.   rosuvastatin (CRESTOR) 20 MG tablet TAKE 1 TABLET(20 MG) BY MOUTH DAILY   spironolactone-hydrochlorothiazide (ALDACTAZIDE) 25-25 MG tablet TAKE 1 TABLET BY MOUTH  DAILY   timolol (TIMOPTIC) 0.25 % ophthalmic solution Place 1 drop into both eyes 2 (two) times daily.   Turmeric 500 MG CAPS Take 1 capsule by mouth daily.   vitamin C (ASCORBIC ACID) 500 MG tablet Take 500 mg by mouth daily.   vitamin E 200 UNIT capsule Take 200 Units by mouth daily.   [DISCONTINUED] empagliflozin (JARDIANCE) 25 MG TABS tablet TAKE 1 TABLET(25 MG) BY MOUTH DAILY BEFORE BREAKFAST   [DISCONTINUED] levothyroxine (SYNTHROID) 100 MCG tablet TAKE 1 TABLET(100 MCG) BY MOUTH DAILY   [DISCONTINUED] lisinopril (ZESTRIL) 20 MG tablet Take 1 tablet (20 mg total) by mouth daily.   No facility-administered encounter medications on file as of 01/10/2023.     Lab Results  Component Value Date   WBC 6.7 01/10/2023   HGB 13.4 01/10/2023   HCT 39.6 01/10/2023   PLT 257.0 01/10/2023   GLUCOSE 144 (H) 01/12/2023   CHOL 143 01/10/2023   TRIG 179.0 (H) 01/10/2023   HDL 45.20 01/10/2023   LDLDIRECT 80.0 04/28/2022   LDLCALC 62 01/10/2023   ALT 13 01/10/2023   AST 14 01/10/2023   NA 135 01/12/2023   K 5.6 (H) 01/12/2023   CL 102 01/12/2023   CREATININE 1.49 (H) 01/12/2023   BUN 53 (H) 01/12/2023   CO2 23 01/12/2023   TSH 0.87 01/10/2023   INR 1.00 07/24/2018   HGBA1C 8.2 (H) 01/10/2023   MICROALBUR 1.0 10/12/2022    MM 3D SCREEN BREAST BILATERAL  Result Date: 06/21/2022 CLINICAL DATA:  Screening. EXAM: DIGITAL SCREENING BILATERAL MAMMOGRAM WITH TOMOSYNTHESIS AND CAD TECHNIQUE: Bilateral screening digital craniocaudal and mediolateral oblique mammograms were obtained. Bilateral screening digital breast tomosynthesis was performed. The images were evaluated with computer-aided  detection. COMPARISON:  Previous exam(s). ACR Breast Density Category b: There are scattered areas of fibroglandular density. FINDINGS: There are no findings suspicious for malignancy. IMPRESSION: No mammographic evidence of malignancy. A result letter of this screening mammogram will be mailed directly to the  patient. RECOMMENDATION: Screening mammogram in one year. (Code:[id number]-B-01Y) BI-RADS CATEGORY  1: Negative. Electronically Signed   By: Meda Klinefelter M.D.   On: 06/21/2022 12:56       Assessment & Plan:  Type 2 diabetes mellitus with hyperglycemia, without long-term current use of insulin (HCC) Assessment & Plan: Discussed diet and exercise.  Low carb diet.  She has been watching diet.  More active - going up and down stairs.  Follow met b and a1c.  Continues on jardiance.    Orders: -     Basic metabolic panel -     Hemoglobin A1c  Hypercholesterolemia Assessment & Plan: Continue pravastatin.  Low cholesterol diet and exercise.  Follow lipid panel and liver function tests.    Orders: -     CBC with Differential/Platelet -     Hepatic function panel -     Lipid panel  Primary hypertension Assessment & Plan: Continues on amlodipine and spironolactone/hctz and now lisinopril 20mg  q day.  Her blood pressure previously has been hard to control.  This regimen has worked well for her.  Has had issues with elevated potassium, but feel she is benefiting from spironolactone.  Recently potassium controlled.  Recheck potassium today. Follow pressures.  Follow metabolic panel.    Hypothyroidism, unspecified type Assessment & Plan: On thyroid replacement.  Follow tsh. Recheck today, given bradycardia.    Orders: -     TSH  Vitamin D deficiency Assessment & Plan: Check vitamin D level.   Orders: -     VITAMIN D 25 Hydroxy (Vit-D Deficiency, Fractures)  Bradycardia Assessment & Plan: Reviewed outside blood pressure and pulse readings.  Most averaging 40-50.  Noted in office pulse rate around 40.  EKG - SR/SB with ventricular rate 39.  Not on beta blockers.  On timolol. No change in eye drops recently.  Currently denies any syncope, near syncope, dizziness or light headedness.  Will check labs, including cbc, metabolic panel and thyroid.  Also place zio monitor.  Have her f/u with  cardiology for further w/up and evaluation.  Discussed if symptoms change or any acute problems, she is to be reevaluated.    Orders: -     EKG 12-Lead -     Ambulatory referral to Cardiology  Stage 3b chronic kidney disease [facility]) Assessment & Plan: Continue to avoid antiinflammatories.  Continue ace inhibitor.  Follow metabolic panel.   Lab Results  Component Value Date   CREATININE 1.49 (H) 01/12/2023     Gastroesophageal reflux disease without esophagitis Assessment & Plan: Controlled.  Follow.     Stress Assessment & Plan: Overall appears to be handling things relatively well. Follow.    Other orders -     Empagliflozin; TAKE 1 TABLET(25 MG) BY MOUTH DAILY BEFORE BREAKFAST  Dispense: 90 tablet; Refill: 1 -     Levothyroxine Sodium; TAKE 1 TABLET(100 MCG) BY MOUTH DAILY  Dispense: 90 tablet; Refill: 1 -     Lisinopril; Take 1 tablet (20 mg total) by mouth daily.  Dispense: 90 tablet; Refill: 1     Dale Shiner, MD

## 2023-01-11 ENCOUNTER — Other Ambulatory Visit: Payer: Self-pay

## 2023-01-11 ENCOUNTER — Ambulatory Visit: Payer: Medicare Other | Attending: Internal Medicine

## 2023-01-11 DIAGNOSIS — R001 Bradycardia, unspecified: Secondary | ICD-10-CM

## 2023-01-11 DIAGNOSIS — E875 Hyperkalemia: Secondary | ICD-10-CM

## 2023-01-12 ENCOUNTER — Other Ambulatory Visit
Admission: RE | Admit: 2023-01-12 | Discharge: 2023-01-12 | Disposition: A | Discharge status: Home / Self Care | Payer: Medicare Other | Source: Ambulatory Visit | Attending: Internal Medicine | Admitting: Internal Medicine

## 2023-01-12 DIAGNOSIS — E875 Hyperkalemia: Secondary | ICD-10-CM | POA: Insufficient documentation

## 2023-01-12 LAB — BASIC METABOLIC PANEL
Anion gap: 10 (ref 5–15)
BUN: 53 mg/dL — ABNORMAL HIGH (ref 8–23)
CO2: 23 mmol/L (ref 22–32)
Calcium: 10 mg/dL (ref 8.9–10.3)
Chloride: 102 mmol/L (ref 98–111)
Creatinine, Ser: 1.49 mg/dL — ABNORMAL HIGH (ref 0.44–1.00)
GFR, Estimated: 35 mL/min — ABNORMAL LOW (ref >60)
Glucose, Bld: 144 mg/dL — ABNORMAL HIGH (ref 70–99)
Potassium: 5.6 mmol/L — ABNORMAL HIGH (ref 3.5–5.1)
Sodium: 135 mmol/L (ref 135–145)

## 2023-01-13 ENCOUNTER — Other Ambulatory Visit: Payer: Self-pay | Admitting: Internal Medicine

## 2023-01-13 DIAGNOSIS — E875 Hyperkalemia: Secondary | ICD-10-CM

## 2023-01-13 MED ORDER — VELTASSA 8.4 G PO PACK: 8.4000 g | PACK | Freq: Every day | ORAL | 0 refills | Status: DC

## 2023-01-13 NOTE — Progress Notes (Signed)
Rx sent in for valtessa.  Order placed for met b - [facility]

## 2023-01-14 DIAGNOSIS — R001 Bradycardia, unspecified: Secondary | ICD-10-CM | POA: Diagnosis not present

## 2023-01-23 ENCOUNTER — Encounter: Payer: Self-pay | Admitting: Internal Medicine

## 2023-01-23 NOTE — Assessment & Plan Note (Signed)
Controlled.  Follow.   

## 2023-01-23 NOTE — Assessment & Plan Note (Signed)
Discussed diet and exercise.  Low carb diet.  She has been watching diet.  More active - going up and down stairs.  Follow met b and a1c.  Continues on jardiance.

## 2023-01-23 NOTE — Assessment & Plan Note (Signed)
Continue pravastatin.  Low cholesterol diet and exercise.  Follow lipid panel and liver function tests.   

## 2023-01-23 NOTE — Assessment & Plan Note (Signed)
Reviewed outside blood pressure and pulse readings.  Most averaging 40-50.  Noted in office pulse rate around 40.  EKG - SR/SB with ventricular rate 39.  Not on beta blockers.  On timolol. No change in eye drops recently.  Currently denies any syncope, near syncope, dizziness or light headedness.  Will check labs, including cbc, metabolic panel and thyroid.  Also place zio monitor.  Have her f/u with cardiology for further w/up and evaluation.  Discussed if symptoms change or any acute problems, she is to be reevaluated.

## 2023-01-23 NOTE — Assessment & Plan Note (Signed)
Continue to avoid antiinflammatories.  Continue ace inhibitor.  Follow metabolic panel.   Lab Results  Component Value Date   CREATININE 1.49 (H) 01/12/2023

## 2023-01-23 NOTE — Assessment & Plan Note (Signed)
Check vitamin D level 

## 2023-01-23 NOTE — Assessment & Plan Note (Signed)
Overall appears to be handling things relatively well.  Follow.   

## 2023-01-23 NOTE — Assessment & Plan Note (Signed)
Continues on amlodipine and spironolactone/hctz and now lisinopril 20mg  q day.  Her blood pressure previously has been hard to control.  This regimen has worked well for her.  Has had issues with elevated potassium, but feel she is benefiting from spironolactone.  Recently potassium controlled.  Recheck potassium today. Follow pressures.  Follow metabolic panel.

## 2023-01-23 NOTE — Assessment & Plan Note (Signed)
On thyroid replacement.  Follow tsh. Recheck today, given bradycardia.

## 2023-01-24 DIAGNOSIS — E875 Hyperkalemia: Secondary | ICD-10-CM | POA: Diagnosis not present

## 2023-01-24 LAB — LAB REPORT - SCANNED: EGFR: 32

## 2023-01-27 ENCOUNTER — Telehealth: Payer: Self-pay | Admitting: Internal Medicine

## 2023-01-27 ENCOUNTER — Telehealth: Payer: Self-pay

## 2023-01-27 DIAGNOSIS — E875 Hyperkalemia: Secondary | ICD-10-CM

## 2023-01-27 MED ORDER — VELTASSA 8.4 G PO PACK: 8.4000 g | PACK | Freq: Every day | ORAL | 0 refills | Status: DC

## 2023-01-27 NOTE — Telephone Encounter (Signed)
Patient is doing ok. She is not having any acute symptoms. Feeling fine. She did not start the valtessa because [facility] did not get it ready prior to her leaving town. She is following a potassium diet and the only thing she has taken with calcium is Tums a couple of times. Advised we need to recheck labs soon. Patient says the earliest she will be able to go would be Monday because her daughter is at work all day today. They did not go to a [facility]. She went to the place where her daughter goes in [place] because it was closer to them. Patient is going to call me back to give me the info to the pharmacy she needs the valtessa sent to in [place] and the info of where to send lab orders.

## 2023-01-27 NOTE — Telephone Encounter (Signed)
Rx sent in. Lab ordered. Patient is aware of below;

## 2023-01-27 NOTE — Telephone Encounter (Signed)
Please call and confirm she is doing ok. Her potassium is remaining elevated.  Make sure she is taking the valtessa daily.  Also, calcium is elevated.  Stop any calcium supplements.  Stay hydrated.  Will need to recheck calcium and potassium soon.  We can fax order for met b for her to have drawn.  Need to know where to fax, or we can put in an order for [facility].

## 2023-01-27 NOTE — Addendum Note (Signed)
Addended by: Rita Ohara D on: 01/27/2023 02:50 PM   Modules accepted: Orders

## 2023-01-27 NOTE — Telephone Encounter (Signed)
Patient states she is calling with information for [place].  Pharmacy information: [facility] Phone: [phone] [facility] corner of [address] where patient goes for labwork: [facility] Phone:  [phone] Fax:  [phone]  Patient states we may call her if we need her.

## 2023-01-27 NOTE — Telephone Encounter (Signed)
See results in chart. Pt had drawn in [place] 5/28.

## 2023-01-27 NOTE — Telephone Encounter (Signed)
Patient called about her lab results that were done out of state on 01/24/2023. Results are in patient's chart.

## 2023-01-27 NOTE — Telephone Encounter (Signed)
Reviewed.  Information attached.  If she has any change in symptoms, she needs to be seen.  Stay hydrated. Stop TUMS.  Needs to start the valtessa.

## 2023-01-31 DIAGNOSIS — E785 Hyperlipidemia, unspecified: Secondary | ICD-10-CM | POA: Diagnosis not present

## 2023-01-31 NOTE — Telephone Encounter (Signed)
Noted. Will wait for results

## 2023-01-31 NOTE — Telephone Encounter (Signed)
LMTCB. Need to know if she had her labs drawn in [place] yesterday

## 2023-01-31 NOTE — Telephone Encounter (Signed)
Pt called back and I read the message to her and she stated she had her labs done today

## 2023-02-01 ENCOUNTER — Encounter: Payer: Self-pay | Admitting: Internal Medicine

## 2023-02-01 LAB — LAB REPORT - SCANNED: EGFR: 32

## 2023-02-01 NOTE — Addendum Note (Signed)
Addended by: Rita Ohara D on: 02/01/2023 12:56 PM   Modules accepted: Orders

## 2023-02-01 NOTE — Telephone Encounter (Signed)
Notify - potassium has decreased.  Continue valtessa.  Recheck met b in one week.

## 2023-02-01 NOTE — Telephone Encounter (Signed)
Patient is aware. She is coming back in town Monday. Patient has been scheduled for met b 6/11.

## 2023-02-01 NOTE — Telephone Encounter (Signed)
See results in her chart. Potassium 5.2. Has came down some since last check

## 2023-02-03 ENCOUNTER — Telehealth: Payer: Self-pay | Admitting: *Deleted

## 2023-02-03 DIAGNOSIS — R001 Bradycardia, unspecified: Secondary | ICD-10-CM | POA: Diagnosis not present

## 2023-02-03 NOTE — Telephone Encounter (Signed)
Discussed monitor with Dr. Lovena Neighbours nurse Kinnie Feil. She provided a couple of times where Dr. Lalla Brothers can be overbooked. Will reach out to patient to see if we can get her scheduled to come in.

## 2023-02-03 NOTE — Telephone Encounter (Signed)
Left voicemail message to call back  

## 2023-02-03 NOTE — Telephone Encounter (Signed)
Cardiology reviewed and she is schedule to see EP.

## 2023-02-03 NOTE — Telephone Encounter (Signed)
Spoke with patient and advised that I wanted to schedule her to see our EP provider. Discussed that this was related to the heart monitor that she wore for her PCP office. Scheduled her to see Dr. Lalla Brothers, advised where we are located, along with date and time of her appointment. She verbalized understanding of our conversation, agreement with plan, and had no further questions at this time.

## 2023-02-03 NOTE — Telephone Encounter (Signed)
[employer] representative return call and notified that cardiology is aware of results that our receiving the results was not the protocol that the corrects steps have been followed now and cardiology is aware and will notify and follow up with patient.

## 2023-02-03 NOTE — Telephone Encounter (Signed)
I am trying to contact [employer] due to my understanding provider is notified of abnormal finding, cardiology should do final and contact patient if urgent referral needed.  I have pasted the preliminary report from the [facility] monitor under CV Procedure. Call report to nurse was bradycardia rate of 24 beats > 60 seconds strip 1 page 8 and 10 runs os SVT.     Preliminary Findings Prepared by Earnstine Regal, 06/ 06/ 24 Patient had a min HR of 24 bpm, max HR of 169 bpm, and avg HR of 54 bpm. Predominant underlying rhythm was Sinus Rhythm. 10 Supraventricular Tachycardia runs occurred, the run with the fastest interval lasting 5 beats with a max rate of 169 bpm, the longest lasting 10 beats with an avg rate of 100 bpm. Some episodes of Supraventricular Tachycardia may be possible Atrial Tachycardia with variable block. Junctional Rhythm was present. Isolated SVEs were rare ( < 1. 0% ) , SVE Couplets were rare ( < 1. 0% ) , and SVE Triplets were rare ( < 1. 0% ) . Isolated VEs were rare ( < 1. 0% ) , and no VE Couplets or VE Triplets were present. MD notification criteria for Bradycardia met - report posted prior to notification per account request [facility]) . Cariology has not read yet per chart was just received 02/02/23.

## 2023-02-03 NOTE — Telephone Encounter (Signed)
Patient returned RN's call. 

## 2023-02-03 NOTE — Telephone Encounter (Signed)
Received report via fax from PCP office. Provided to EP NP here in office for her review. Recommendations were to schedule her for appointment. Report was assigned to Dr. Lalla Brothers and secure chat was sent. Will reach out to patient for scheduling.

## 2023-02-07 ENCOUNTER — Other Ambulatory Visit (INDEPENDENT_AMBULATORY_CARE_PROVIDER_SITE_OTHER): Payer: Medicare Other

## 2023-02-07 DIAGNOSIS — E875 Hyperkalemia: Secondary | ICD-10-CM | POA: Diagnosis not present

## 2023-02-07 LAB — BASIC METABOLIC PANEL
BUN: 37 mg/dL — ABNORMAL HIGH (ref 6–23)
CO2: 31 mEq/L (ref 19–32)
Calcium: 10.4 mg/dL (ref 8.4–10.5)
Chloride: 97 mEq/L (ref 96–112)
Creatinine, Ser: 1.69 mg/dL — ABNORMAL HIGH (ref 0.40–1.20)
GFR: 28.04 mL/min — ABNORMAL LOW (ref >60.00)
Glucose, Bld: 163 mg/dL — ABNORMAL HIGH (ref 70–99)
Potassium: 5.2 mEq/L — ABNORMAL HIGH (ref 3.5–5.1)
Sodium: 136 mEq/L (ref 135–145)

## 2023-02-08 ENCOUNTER — Telehealth: Payer: Self-pay

## 2023-02-08 NOTE — Telephone Encounter (Signed)
Ok to give results. Patient is back home.

## 2023-02-08 NOTE — Telephone Encounter (Signed)
-----   Message from Dale Avenal, MD sent at 02/08/2023  3:51 AM EDT ----- Potassium is remaining lower.  Still slightly elevated, but decreased.  Continue valtessa.  Kidney function has decreased some.  Needs to continue to stay hydrated.  Does she have a f/u appt with nephrology?  If so , when.  (Is she back home now?)

## 2023-02-08 NOTE — Telephone Encounter (Signed)
LMTCB regarding lab and zio monitor results.

## 2023-02-08 NOTE — Telephone Encounter (Signed)
-----   Message from Dale Faunsdale, MD sent at 02/07/2023  9:54 PM EDT ----- See result note.

## 2023-02-09 NOTE — Telephone Encounter (Signed)
Pt called back and I read the message to her and she has an appointment with Dr.Lambert on June 19th

## 2023-02-09 NOTE — Telephone Encounter (Signed)
See result note.  

## 2023-02-13 ENCOUNTER — Telehealth: Payer: Self-pay

## 2023-02-13 DIAGNOSIS — R944 Abnormal results of kidney function studies: Secondary | ICD-10-CM

## 2023-02-13 DIAGNOSIS — E875 Hyperkalemia: Secondary | ICD-10-CM

## 2023-02-13 NOTE — Telephone Encounter (Signed)
-----   Message from Dale [place], MD sent at 02/10/2023  4:03 AM EDT ----- In reviewing, it appears she has seen urology.  Please notify - I do not see where she has seen nephrology.  Needs appt with nephrology - to follow up regarding renal function and due to being on veltassa - f/u regarding potassium.  Needs appt with [facility] asap.  I have talked to them about her.  Also, will need to continue to follow potassium and kidney function.  Recheck met b in 10 days.

## 2023-02-14 NOTE — Progress Notes (Unsigned)
  Electrophysiology Office Note:    Date:  02/15/2023   ID:  Lorraine Foley, DOB 1941-04-19, MRN [id number]  [facility] Cardiologist:  None  [facility] Electrophysiologist:  Lorraine Prude, MD   Referring MD: Lorraine Montrose, MD   Chief Complaint: bradycardia  History of Present Illness:    Lorraine Foley is a 82 y.o. female who I am seeing for an evaluation of bradycardia at the request of Dr Lorraine Foley. The patient saw Dr Lorraine Foley 01/10/2023. She has a history of DM, HTN. At that appointment she reported bradycardia. No syncope reported at that visit.   She is with her niece today in clinic.  She reports no syncope or presyncope.  She has never noticed a low heartbeat before.  Her biggest concern is her blood pressures recently.  They have drifted up to around 170 at home as it is today in clinic.  She is working on reducing dietary intake of potassium.      Their past medical, social and family history was reveiwed.   ROS:   Please see the history of present illness.    All other systems reviewed and are negative.  EKGs/Labs/Other Studies Reviewed:    The following studies were reviewed today:  02/03/2023 Zio monitor reviewed personally HR 24 - 169 bpm, average 54 bpm. 10 nonsustained SVT Junctional rhythm present  Creatinine 1.69 from February 07, 2023 with a potassium of 5.2  EKG:  The ekg ordered today demonstrates sinus bradycardia with a ventricular rate of 52 bpm.   Physical Exam:    VS:  BP (!) 170/70 (BP Location: Left Arm, Patient Position: Sitting, Cuff Size: Normal)   Pulse (!) 52   Ht 5' (1.524 m)   Wt 196 lb 2 oz (89 kg)   LMP 08/11/1995   SpO2 98%   BMI 38.30 kg/m     Wt Readings from Last 3 Encounters:  02/15/23 196 lb 2 oz (89 kg)  01/10/23 200 lb (90.7 kg)  10/12/22 194 lb 12.8 oz (88.4 kg)     GEN:  Well nourished, well developed in no acute distress.  Obese CARDIAC: Bradycardic, regular rhythm, no murmurs, rubs, gallops RESPIRATORY:  Clear to  auscultation without rales, wheezing or rhonchi       ASSESSMENT AND PLAN:    1. Bradycardia   2. Primary hypertension     #Bradycardia Asymptomatic.  Likely in part due to her clonidine use.  Her clonidine is necessary for blood pressure control.  #Hypertension Above goal today.  Her blood pressures have been trending around 170 mmHg at home.  She has been struggling with hyperkalemia.  I suspect some of her hyperkalemia is due to her lisinopril and spironolactone.  I discussed possibly a referral to our hypertension clinic in [place] today.  She will discuss it with Dr. Lorin Foley at her upcoming appointment and let us know if we can be of any assistance.           Signed, Lorraine Foley. Lorraine Brothers, MD, [facility], [facility] 02/15/2023 10:28 AM    Electrophysiology [facility]

## 2023-02-15 ENCOUNTER — Ambulatory Visit: Payer: Medicare Other | Attending: Cardiology | Admitting: Cardiology

## 2023-02-15 ENCOUNTER — Encounter: Payer: Self-pay | Admitting: Cardiology

## 2023-02-15 ENCOUNTER — Other Ambulatory Visit: Payer: Self-pay

## 2023-02-15 VITALS — BP 170/70 | HR 52 | Ht 60.0 in | Wt 196.1 lb

## 2023-02-15 DIAGNOSIS — E875 Hyperkalemia: Secondary | ICD-10-CM

## 2023-02-15 DIAGNOSIS — R001 Bradycardia, unspecified: Secondary | ICD-10-CM | POA: Diagnosis not present

## 2023-02-15 DIAGNOSIS — I10 Essential (primary) hypertension: Secondary | ICD-10-CM

## 2023-02-15 NOTE — Patient Instructions (Signed)
Medication Instructions:  Your physician recommends that you continue on your current medications as directed. Please refer to the Current Medication list given to you today.  *If you need a refill on your cardiac medications before your next appointment, please call your pharmacy*  Follow-Up: At [facility], you and your health needs are our priority.  As part of our continuing mission to provide you with exceptional heart care, we have created designated Provider Care Teams.  These Care Teams include your primary Cardiologist (physician) and Advanced Practice Providers (APPs -  Physician Assistants and Nurse Practitioners) who all work together to provide you with the care you need, when you need it.  Follow up with Dr. Lambert as needed.   

## 2023-02-15 NOTE — Addendum Note (Signed)
Addended by: Rita Ohara D on: 02/15/2023 12:48 PM   Modules accepted: Orders

## 2023-02-16 ENCOUNTER — Other Ambulatory Visit: Payer: Self-pay | Admitting: Internal Medicine

## 2023-02-16 ENCOUNTER — Telehealth: Payer: Self-pay | Admitting: Internal Medicine

## 2023-02-16 ENCOUNTER — Other Ambulatory Visit (INDEPENDENT_AMBULATORY_CARE_PROVIDER_SITE_OTHER): Payer: Medicare Other

## 2023-02-16 DIAGNOSIS — E875 Hyperkalemia: Secondary | ICD-10-CM

## 2023-02-16 DIAGNOSIS — N1832 Chronic kidney disease, stage 3b: Secondary | ICD-10-CM

## 2023-02-16 LAB — BASIC METABOLIC PANEL
BUN: 57 mg/dL — ABNORMAL HIGH (ref 6–23)
CO2: 34 mEq/L — ABNORMAL HIGH (ref 19–32)
Calcium: 10.4 mg/dL (ref 8.4–10.5)
Chloride: 99 mEq/L (ref 96–112)
Creatinine, Ser: 2.42 mg/dL — ABNORMAL HIGH (ref 0.40–1.20)
GFR: 18.22 mL/min — ABNORMAL LOW (ref >60.00)
Glucose, Bld: 153 mg/dL — ABNORMAL HIGH (ref 70–99)
Potassium: 4.9 mEq/L (ref 3.5–5.1)
Sodium: 140 mEq/L (ref 135–145)

## 2023-02-16 MED ORDER — SPIRONOLACTONE 25 MG PO TABS
25.0000 mg | ORAL_TABLET | Freq: Every day | ORAL | 1 refills | Status: DC
Start: 2023-02-16 — End: 2023-05-03

## 2023-02-16 NOTE — Addendum Note (Signed)
Addended by: Rita Ohara D on: 02/16/2023 09:22 AM   Modules accepted: Orders

## 2023-02-16 NOTE — Addendum Note (Signed)
Addended by: Rita Ohara D on: 02/16/2023 09:19 AM   Modules accepted: Orders

## 2023-02-16 NOTE — Progress Notes (Signed)
Rx sent in for spironolactone 25mg  .  Will stop spironolactone/hydrochlorothiazide.  Note sent to pharmacy. Met b ordered.

## 2023-02-16 NOTE — Telephone Encounter (Signed)
Copied from CRM [id number]. Topic: Medicare AWV >> Feb 16, 2023 10:48 AM Payton Doughty wrote: Reason for CRM: LM 02/16/2023 to schedule AWV   Verlee Rossetti; Care Guide Ambulatory Clinical Support [place] [facility] Direct Dial: [phone]

## 2023-02-16 NOTE — Progress Notes (Signed)
Order placed for stat met b.  

## 2023-02-23 DIAGNOSIS — E785 Hyperlipidemia, unspecified: Secondary | ICD-10-CM | POA: Diagnosis not present

## 2023-02-23 DIAGNOSIS — N1832 Chronic kidney disease, stage 3b: Secondary | ICD-10-CM | POA: Diagnosis not present

## 2023-02-24 ENCOUNTER — Other Ambulatory Visit: Payer: Self-pay | Admitting: Internal Medicine

## 2023-02-24 NOTE — Telephone Encounter (Signed)
Potassium was 4.9 on 02/16/23. Okay to refill?

## 2023-02-24 NOTE — Telephone Encounter (Signed)
Ok to refill.  Need her lab results.

## 2023-02-28 ENCOUNTER — Encounter: Payer: Self-pay | Admitting: Internal Medicine

## 2023-02-28 ENCOUNTER — Telehealth (INDEPENDENT_AMBULATORY_CARE_PROVIDER_SITE_OTHER): Payer: Medicare Other | Admitting: Internal Medicine

## 2023-02-28 VITALS — BP 124/55

## 2023-02-28 DIAGNOSIS — N1832 Chronic kidney disease, stage 3b: Secondary | ICD-10-CM | POA: Diagnosis not present

## 2023-02-28 DIAGNOSIS — E7800 Pure hypercholesterolemia, unspecified: Secondary | ICD-10-CM | POA: Diagnosis not present

## 2023-02-28 DIAGNOSIS — E039 Hypothyroidism, unspecified: Secondary | ICD-10-CM

## 2023-02-28 DIAGNOSIS — E1165 Type 2 diabetes mellitus with hyperglycemia: Secondary | ICD-10-CM

## 2023-02-28 DIAGNOSIS — I10 Essential (primary) hypertension: Secondary | ICD-10-CM | POA: Diagnosis not present

## 2023-02-28 DIAGNOSIS — Z7984 Long term (current) use of oral hypoglycemic drugs: Secondary | ICD-10-CM

## 2023-02-28 MED ORDER — VELTASSA 8.4 G PO PACK: 8.4000 g | PACK | Freq: Every day | ORAL | 1 refills | Status: DC

## 2023-02-28 NOTE — Progress Notes (Signed)
Patient ID: Lorraine Foley, female   DOB: 07/28/1941, 82 y.o.   MRN: [id number]   Virtual Visit via telephone Note  All issues noted in this document were discussed and addressed.  No physical exam was performed (except for noted visual exam findings with Video Visits).   I connected with Lorraine Foley by a video enabled telemedicine application or telephone and verified that I am speaking with the correct person using two identifiers. Location patient: home Location provider: work  Persons participating in the telephone visit: patient, provider  The limitations, risks, security and privacy concerns of performing an evaluation and management service by telephone and the availability of in person appointments have been discussed.  It has also been discussed with the patient that there may be a patient responsible charge related to this service. The patient expressed understanding and agreed to proceed.  Interactive audio and video telecommunications were attempted between this provider and patient, however failed, due to patient having technical difficulties OR patient did not have access to video capability.  We continued and completed visit with audio only.   Reason for visit: follow up appt.   HPI: Follow up appt - f/u regarding her blood pressure and hyperkalemia and recent medication adjustment. She has been having problems recently with decreased renal function and elevated potassium.  She was started on veltassa with noted improvement in her potassium.  We tried to maintain her current medication regimen for her blood pressure due to previous hard to control blood pressure.  This regimen has worked for her for a long time.  Recently though, problems as outlined.  Given recent decreased GFR and elevated BUN, her hydrochlorothiazide was stopped.  Recent labs revealed her renal function to be much improved.  Potassium level wnl.  She does report that her blood pressure recently has increased.  Has  noticed 160s systolic recently.  No chest pain.  Breathing stable.  Has been walking more lately.  Has been anxious about above.  Feels better after discussion today.  Plans to continue to follow diet adjustments.     ROS: See pertinent positives and negatives per HPI.  Past Medical History:  Diagnosis Date   Arthritis    knees   Diabetes mellitus (HCC)    GERD (gastroesophageal reflux disease)    Glaucoma    Hypercholesterolemia    Hypertension    Hypothyroidism    Pneumonia     Past Surgical History:  Procedure Laterality Date   CATARACT EXTRACTION W/PHACO Left 11/09/2016   Procedure: CATARACT EXTRACTION PHACO AND INTRAOCULAR LENS PLACEMENT (IOC)  left diabetic complicated;  Surgeon: Lockie Mola, MD;  Location: [facility];  Service: Ophthalmology;  Laterality: Left;  Diabetic - oral meds   CATARACT EXTRACTION W/PHACO Right 03/08/2017   Procedure: CATARACT EXTRACTION PHACO AND INTRAOCULAR LENS PLACEMENT (IOC)  Right Diabetic complicated;  Surgeon: Lockie Mola, MD;  Location: [facility];  Service: Ophthalmology;  Laterality: Right;   EYE SURGERY Bilateral 08/2014, 2.2016   laser surgery in preparation for glaucoma surgery   KNEE ARTHROPLASTY Left 08/06/2018   Procedure: COMPUTER ASSISTED TOTAL KNEE ARTHROPLASTY;  Surgeon: Donato Heinz, MD;  Location: [facility] ORS;  Service: Orthopedics;  Laterality: Left;   TOTAL HIP ARTHROPLASTY  5/13   right    Family History  Problem Relation Age of Onset   Mental illness Father        suicide   Liver disease Father        alcohol   Glaucoma  Father    Alcohol abuse Father    Blindness Father    Glaucoma Mother    Osteoporosis Mother    Diabetes Sister    Breast cancer Sister 71   Colon cancer Neg Hx     SOCIAL HX: reviewed.    Current Outpatient Medications:    acetaminophen (TYLENOL) 650 MG CR tablet, Take 650 mg by mouth daily as needed for pain., Disp: , Rfl:    amLODipine (NORVASC) 5 MG tablet,  Take 1 tablet (5 mg total) by mouth 2 (two) times daily., Disp: 180 tablet, Rfl: 3   aspirin EC 81 MG tablet, Take 81 mg by mouth daily. Swallow whole., Disp: , Rfl:    blood glucose meter kit and supplies, Dispense based on patient and insurance preference. Use up to four times daily as directed. (FOR ICD-10 E10.9, E11.9)., Disp: 1 each, Rfl: 3   Cholecalciferol (VITAMIN D-3) 1000 UNITS CAPS, Take 1 capsule by mouth daily., Disp: , Rfl:    cloNIDine (CATAPRES) 0.1 MG tablet, TAKE 1 TABLET(0.1 MG) BY MOUTH THREE TIMES DAILY, Disp: 270 tablet, Rfl: 3   empagliflozin (JARDIANCE) 25 MG TABS tablet, TAKE 1 TABLET(25 MG) BY MOUTH DAILY BEFORE BREAKFAST, Disp: 90 tablet, Rfl: 1   glucosamine-chondroitin 500-400 MG tablet, Take 1 tablet by mouth 3 (three) times daily., Disp: , Rfl:    glucose blood (CONTOUR NEXT TEST) test strip, USE TWICE DAILY AS DIRECTED, Disp: 100 strip, Rfl: 1   Lancets (ONETOUCH ULTRASOFT) lancets, Use as instructed to check blood sugars once daily. Dx E11.9, Disp: 100 each, Rfl: 12   levothyroxine (SYNTHROID) 100 MCG tablet, TAKE 1 TABLET(100 MCG) BY MOUTH DAILY, Disp: 90 tablet, Rfl: 1   lisinopril (ZESTRIL) 20 MG tablet, Take 1 tablet (20 mg total) by mouth daily., Disp: 90 tablet, Rfl: 1   patiromer (VELTASSA) 8.4 g packet, Take 1 packet (8.4 g total) by mouth daily., Disp: 30 each, Rfl: 1   ROCKLATAN 0.02-0.005 % SOLN, Apply 1 drop to eye at bedtime., Disp: , Rfl:    rosuvastatin (CRESTOR) 20 MG tablet, TAKE 1 TABLET(20 MG) BY MOUTH DAILY, Disp: 90 tablet, Rfl: 3   spironolactone (ALDACTONE) 25 MG tablet, Take 1 tablet (25 mg total) by mouth daily., Disp: 90 tablet, Rfl: 1   timolol (TIMOPTIC) 0.25 % ophthalmic solution, Place 1 drop into both eyes 2 (two) times daily., Disp: , Rfl:    Turmeric 500 MG CAPS, Take 1 capsule by mouth daily., Disp: , Rfl:    vitamin C (ASCORBIC ACID) 500 MG tablet, Take 500 mg by mouth daily., Disp: , Rfl:    vitamin E 200 UNIT capsule, Take 200  Units by mouth daily., Disp: , Rfl:   EXAM:  GENERAL: alert, oriented. Sounds to be in no acute distress.   PSYCH/NEURO: pleasant and cooperative, no obvious depression or anxiety, speech and thought processing grossly intact  ASSESSMENT AND PLAN:  Discussed the following assessment and plan:  Problem List Items Addressed This Visit     Hypothyroidism    On thyroid replacement.  Follow tsh.       Hypertension    Continues on amlodipine and spironolactone and lisinopril 20mg  q day.  Her blood pressure previously has been hard to control.  This regimen has worked well for her.  Has had issues with elevated potassium, but feel she is benefiting from spironolactone.  Recently potassium controlled.  On valtessa.  Given worsening renal function with elevated BUN, hydrochlorothiazide was stopped recently.  Kidney function has improved.  Potassium wnl.  Blood pressure elevated as outlined.  Has been more stressed.  Will hold on additional medication.  Follow pressures and send in readings.  Consider hypertension clinic.      Hypercholesterolemia    Continue pravastatin.  Low cholesterol diet and exercise.  Follow lipid panel and liver function tests.        Diabetes mellitus (HCC)    Discussed diet and exercise.  Low carb diet.  She has been watching diet.  More active - walking more.Follow met b and a1c.  Continues on jardiance.  Stay hydrated.       CKD (chronic kidney disease), stage III (HCC) - Primary    Continue to avoid antiinflammatories.  Continue ace inhibitor.  Remain off hydrochlorothiazide.  Recent creatinine 1.2 with GFR 45 - improved. Follow metabolic panel.          Return in about 8 weeks (around 04/25/2023) for follow-up.   I discussed the assessment and treatment plan with the patient. The patient was provided an opportunity to ask questions and all were answered. The patient agreed with the plan and demonstrated an understanding of the instructions.   The patient  was advised to call back or seek an in-person evaluation if the symptoms worsen or if the condition fails to improve as anticipated.  I provided 23 minutes of non-face-to-face time during this encounter.   Dale Turah, MD

## 2023-02-28 NOTE — Telephone Encounter (Signed)
Refuse this rx. We sent during her visit.

## 2023-03-05 ENCOUNTER — Encounter: Payer: Self-pay | Admitting: Internal Medicine

## 2023-03-05 ENCOUNTER — Telehealth: Payer: Self-pay | Admitting: Internal Medicine

## 2023-03-05 NOTE — Assessment & Plan Note (Signed)
Low carb diet and exercise.  Follow met b and a1c. On jardiance.  Walking more.

## 2023-03-05 NOTE — Assessment & Plan Note (Signed)
On thyroid replacement.  Follow tsh.  

## 2023-03-05 NOTE — Assessment & Plan Note (Signed)
Continues on amlodipine and spironolactone and lisinopril 20mg  q day.  Her blood pressure previously has been hard to control.  This regimen has worked well for her.  Has had issues with elevated potassium, but feel she is benefiting from spironolactone.  Recently potassium controlled.  On valtessa.  Given worsening renal function with elevated BUN, hydrochlorothiazide was stopped recently.  Kidney function has improved.  Potassium wnl.  Blood pressure elevated as outlined.  Has been more stressed.  Will hold on additional medication.  Follow pressures and send in readings.  Consider hypertension clinic.

## 2023-03-05 NOTE — Assessment & Plan Note (Signed)
Continue pravastatin.  Low cholesterol diet and exercise.  Follow lipid panel and liver function tests.   

## 2023-03-05 NOTE — Assessment & Plan Note (Signed)
Discussed diet and exercise.  Low carb diet.  She has been watching diet.  More active - walking more.Follow met b and a1c.  Continues on jardiance.  Stay hydrated.

## 2023-03-05 NOTE — Telephone Encounter (Signed)
Please schedule f/u appt in 8 weeks.  Thanks.  

## 2023-03-05 NOTE — Assessment & Plan Note (Signed)
Continue to avoid antiinflammatories.  Continue ace inhibitor.  Remain off hydrochlorothiazide.  Recent creatinine 1.2 with GFR 45 - improved. Follow metabolic panel.

## 2023-03-06 NOTE — Telephone Encounter (Signed)
LVMTCB to make an 8 week follow-up

## 2023-03-16 ENCOUNTER — Other Ambulatory Visit: Payer: Self-pay | Admitting: Internal Medicine

## 2023-03-30 ENCOUNTER — Telehealth: Payer: Self-pay | Admitting: Internal Medicine

## 2023-03-30 DIAGNOSIS — I10 Essential (primary) hypertension: Secondary | ICD-10-CM

## 2023-03-30 NOTE — Telephone Encounter (Signed)
Patient dropped off document  BP readings , for provider. . Document is located in providers folder at front office.

## 2023-03-31 NOTE — Telephone Encounter (Signed)
BP readings placed in folder for review

## 2023-04-01 NOTE — Telephone Encounter (Signed)
Reviewed blood pressures.  Varying, but elevated above goal.  Please confirm blood pressure medication she is taking a when taking.  Will need to make adjustments.  Also, cardiology had mentioned wanting to refer her to hypertension clinic.  If agreeable, would like to make referral.

## 2023-04-03 NOTE — Telephone Encounter (Signed)
Lvm to call office back

## 2023-04-04 NOTE — Telephone Encounter (Signed)
Referral placed to hypertension clinic. Have her continue to spot check pressure and send in.  Avoid increased sodium intake.  Walk/exercise.

## 2023-04-04 NOTE — Telephone Encounter (Signed)
Amlodipine BID Clonidine TID- 4am, 12pm, 8 pm Lisinopril 1 tablet q day in the morning Spironolactone 1 tablet q day in the morning  She is ok with referral to HTN clinic but is in [place] until September.

## 2023-04-04 NOTE — Telephone Encounter (Signed)
Patient returned office phone call. 

## 2023-04-05 NOTE — Telephone Encounter (Signed)
Patient is aware of below. 

## 2023-04-13 ENCOUNTER — Encounter (INDEPENDENT_AMBULATORY_CARE_PROVIDER_SITE_OTHER): Payer: Self-pay

## 2023-04-17 NOTE — Telephone Encounter (Signed)
Error

## 2023-05-03 ENCOUNTER — Other Ambulatory Visit: Payer: Self-pay | Admitting: Internal Medicine

## 2023-05-03 ENCOUNTER — Encounter: Payer: Self-pay | Admitting: Internal Medicine

## 2023-05-03 ENCOUNTER — Ambulatory Visit: Payer: Medicare Other | Attending: Internal Medicine | Admitting: Internal Medicine

## 2023-05-03 VITALS — BP 178/74 | HR 56 | Ht 60.0 in | Wt 194.2 lb

## 2023-05-03 DIAGNOSIS — R400 Somnolence: Secondary | ICD-10-CM | POA: Diagnosis not present

## 2023-05-03 DIAGNOSIS — R0609 Other forms of dyspnea: Secondary | ICD-10-CM

## 2023-05-03 DIAGNOSIS — I10 Essential (primary) hypertension: Secondary | ICD-10-CM

## 2023-05-03 DIAGNOSIS — E7800 Pure hypercholesterolemia, unspecified: Secondary | ICD-10-CM

## 2023-05-03 LAB — BASIC METABOLIC PANEL
BUN/Creatinine Ratio: 22 (ref 12–28)
BUN: 26 mg/dL (ref 8–27)
CO2: 32 mmol/L — ABNORMAL HIGH (ref 20–29)
Calcium: 10.9 mg/dL — ABNORMAL HIGH (ref 8.7–10.3)
Chloride: 100 mmol/L (ref 96–106)
Creatinine, Ser: 1.2 mg/dL — ABNORMAL HIGH (ref 0.57–1.00)
Glucose: 182 mg/dL — ABNORMAL HIGH (ref 70–99)
Potassium: 5.1 mmol/L (ref 3.5–5.2)
Sodium: 141 mmol/L (ref 134–144)
eGFR: 45 mL/min/{1.73_m2} — ABNORMAL LOW (ref >59)

## 2023-05-03 MED ORDER — HYDROCHLOROTHIAZIDE 12.5 MG PO CAPS: 12.5000 mg | ORAL_CAPSULE | Freq: Every day | ORAL | 3 refills | Status: DC

## 2023-05-03 NOTE — Progress Notes (Signed)
Cardiology Office Note:  .   Date:  05/03/2023  ID:  Lorraine Foley, DOB 24-Sep-1940, MRN [id number] PCP: Dale Hamilton, MD  [facility] Providers Cardiologist:  New  Electrophysiologist:  Lanier Prude, MD     History of Present Illness: .   Lorraine Foley is a 82 y.o. female with history of hypertension and type 2 diabetes mellitus, who has been referred for management of hypertension.  She was seen by Dr. Lalla Brothers in June for evaluation of asymptomatic bradycardia.  It was thought that clonidine could be contributing to this.  She has also had issues with hyperkalemia on concurrent lisinopril and spironolactone.  Dr. Lalla Brothers did not make any medication changes but suggested referral to our hypertension clinic in [place].  Today, Lorraine Foley reports that she is feeling well, denying chest pain, palpitations, lightheadedness, and edema.  She initially denies dyspnea, though on further questioning, she gets out of breath easily when she walks any extended distance.  She typically needs to rest after walking less than 100 feet with her walker.  This has been present for at least the last 6 months and has not worsened.  She notes that her blood pressure had been fairly well-controlled until May when Dr. Lorin Picket discontinued HCTZ due to AKI and hyperkalemia.  Spironolactone and lisinopril were both continued with the addition of Veltassa to help with hyperkalemia.  This point notes that workup for obstructive sleep apnea has been brought up by Dr. Lorin Picket in the past, though she did not wish to move forward with this.  On further questioning, she does not believe that she snores.  She feels refreshed in the morning but often times gets tired during the day and sometimes feels like she needs to nap.  ROS: See HPI  Studies Reviewed: Marland Kitchen   EKG Interpretation Date/Time:  Wednesday May 03 2023 09:22:04 EDT Ventricular Rate:  56 PR Interval:  182 QRS Duration:  84 QT Interval:  400 QTC  Calculation: 386 R Axis:   -15  Text Interpretation: Sinus bradycardia Minimal voltage criteria for LVH, may be normal variant ( R in aVL ) Possible Anterior infarct versus lead placement Abnormal ECG When compared with ECG of 15-Feb-2023 No significant change was found Confirmed by Isyss Espinal [id number]) on 05/03/2023 1:39:27 PM    Event monitor (01/11/2023): Predominantly sinus rhythm with junctional rhythm and 10 episodes of NSVT.  Rare PACs and PVCs noted. Risk Assessment/Calculations:     HYPERTENSION CONTROL Vitals:   05/03/23 0917 05/03/23 0924 05/03/23 0951  BP: (!) 190/82 (!) 195/75 (!) 178/74    The patient's blood pressure is elevated above target today.  In order to address the patient's elevated BP: A new medication was prescribed today.; A current anti-hypertensive medication was adjusted today.         Physical Exam:   VS:  BP (!) 178/74 (BP Location: Left Arm)   Pulse (!) 56   Ht 5' (1.524 m)   Wt 194 lb 4 oz (88.1 kg)   LMP 08/11/1995   SpO2 98%   BMI 37.94 kg/m    Wt Readings from Last 3 Encounters:  05/03/23 194 lb 4 oz (88.1 kg)  02/15/23 196 lb 2 oz (89 kg)  01/10/23 200 lb (90.7 kg)    General:  NAD. Neck: No JVD or HJR. Lungs: Clear to auscultation bilaterally without wheezes or crackles. Heart: Bradycardic but regular without murmurs, rubs, or gallops. Abdomen: Soft, nontender, nondistended. Extremities: No lower extremity edema.  ASSESSMENT AND PLAN: .    Uncontrolled hypertension: In general, blood pressure has been fairly well-controlled until this spring.  Since then, Lorraine Foley's blood pressure has been severely elevated despite multiple medication adjustments.  Fortunately, she is asymptomatic.  I think it would be prudent to pursue a secondary hypertension workup.  We will therefore arrange for a renal artery Doppler to exclude renal artery stenosis.  I will also have Lorraine Foley hold spironolactone for a couple of weeks so that we can measure a  serum aldosterone:plasma renin activity ratio.  Given concerns surrounding hyperkalemia, I think it would be prudent to resume HCTZ 12.5 mg daily with discontinuation of spironolactone today.  We will continue current doses of amlodipine, clonidine, and lisinopril.  I will check a basic metabolic panel today; Lorraine Foley notes that she is also due for labs with Dr. Lorin Picket next week.  I will refer Lorraine Foley to pulmonology for a sleep study, given her resistant hypertension and daytime somnolence.  If blood pressure remains difficult to control and secondary hypertension workup is unrevealing, referral to the hypertension clinic in [place] will need to be considered, as well as renal denervation.  Sinus bradycardia: This is incidentally noted again today.  Prior event monitor also showed episodes of junctional rhythm.  Lorraine Foley has seen Dr. Lalla Brothers (EP) who did not recommend any further intervention, though it was noted that clonidine could be contributing to her bradycardia.  Unfortunately, we are unable to hold clonidine at this time due to poorly controlled hypertension.  Lorraine Foley is also taking timolol eyedrops, which could be contributing to her bradycardia.  I have encouraged her to mention this to her ophthalmologist at their next visit, so that alternative therapy could be considered, if appropriate.  Dyspnea on exertion: This has been present for months to years, though Lorraine Foley notes that she is only able to walk about 100 feet before needing to stop.  She is certainly at risk for hypertensive heart disease and also has evidence of LVH by EKG.  We will arrange for an echocardiogram at her convenience.    Dispo: Return to clinic in 1 month.  Signed, Yvonne Kendall, MD

## 2023-05-03 NOTE — Patient Instructions (Signed)
Medication Instructions:  Your physician recommends the following medication changes.  STOP TAKING: Spironolactone   START TAKING: Hydrochlorothiazide 12.5 mg by mouth daily   *If you need a refill on your cardiac medications before your next appointment, please call your pharmacy*   Lab Work: Your provider would like for you to have following labs drawn today (BMP), then return day of test for aldosterone + renin [facility] the [facility] mall   Testing/Procedures: Your physician has requested that you have an echocardiogram. Echocardiography is a painless test that uses sound waves to create images of your heart. It provides your doctor with information about the size and shape of your heart and how well your heart's chambers and valves are working.   You may receive an ultrasound enhancing agent through an IV if needed to better visualize your heart during the echo. This procedure takes approximately one hour.  There are no restrictions for this procedure.  This will take place at [address] [address], [place] [address]   Your physician has requested that you have a renal artery duplex. During this test, an ultrasound is used to evaluate blood flow to the kidneys. Take your medications as you usually do. This will take place at [address], [place] [address].  No food after 11PM the night before.  Water is OK. (Don't drink liquids if you have been instructed not to for ANOTHER test). Avoid foods that produce bowel gas, for 24 hours prior to exam (see below). No breakfast, no chewing gum, no smoking or carbonated beverages. Patient may take morning medications with water. Come in for test at least 15 minutes early to register.    Follow-Up: At [facility], you and your health needs are our priority.  As part of our continuing mission to provide you with exceptional heart care, we have created designated Provider Care  Teams.  These Care Teams include your primary Cardiologist (physician) and Advanced Practice Providers (APPs -  Physician Assistants and Nurse Practitioners) who all work together to provide you with the care you need, when you need it.  We recommend signing up for the patient portal called "MyChart".  Sign up information is provided on this After Visit Summary.  MyChart is used to connect with patients for Virtual Visits (Telemedicine).  Patients are able to view lab/test results, encounter notes, upcoming appointments, etc.  Non-urgent messages can be sent to your provider as well.   To learn more about what you can do with MyChart, go to [email or web address].    Your next appointment:   1 month(s)  Provider:   You may see Yvonne Kendall, MD or one of the following Advanced Practice Providers on your designated Care Team:   Nicolasa Ducking, NP Eula Listen, PA-C Cadence Fransico Michael, PA-C Charlsie Quest, NP

## 2023-05-04 DIAGNOSIS — N1832 Chronic kidney disease, stage 3b: Secondary | ICD-10-CM | POA: Diagnosis not present

## 2023-05-04 DIAGNOSIS — N189 Chronic kidney disease, unspecified: Secondary | ICD-10-CM | POA: Diagnosis not present

## 2023-05-04 DIAGNOSIS — E1122 Type 2 diabetes mellitus with diabetic chronic kidney disease: Secondary | ICD-10-CM | POA: Diagnosis not present

## 2023-05-04 DIAGNOSIS — R809 Proteinuria, unspecified: Secondary | ICD-10-CM | POA: Diagnosis not present

## 2023-05-04 DIAGNOSIS — E875 Hyperkalemia: Secondary | ICD-10-CM | POA: Diagnosis not present

## 2023-05-04 DIAGNOSIS — N184 Chronic kidney disease, stage 4 (severe): Secondary | ICD-10-CM | POA: Diagnosis not present

## 2023-05-10 ENCOUNTER — Telehealth: Payer: Self-pay | Admitting: Internal Medicine

## 2023-05-10 ENCOUNTER — Other Ambulatory Visit: Payer: Self-pay

## 2023-05-10 DIAGNOSIS — Z79899 Other long term (current) drug therapy: Secondary | ICD-10-CM

## 2023-05-10 NOTE — Telephone Encounter (Signed)
The patient has been notified of the result along with recommendations. Pt verbalized understanding. All questions (if any) were answered     Yvonne Kendall, MD 05/09/2023  7:21 AM EDT     Please let Ms. Burry know that her kidney function and potassium are at baseline on BMP drawn at our visit last week.  I recommend that she continue her current medications and have repeat labs drawn in a couple of weeks, including BMP and serum aldosterone:plasma renin activity ratio (this can be done through our office or with Dr. Lorin Picket).

## 2023-05-10 NOTE — Telephone Encounter (Signed)
Pt stated she was returning nurse Alisha's call and is requesting a callback. Please advise

## 2023-05-11 ENCOUNTER — Encounter: Payer: Self-pay | Admitting: Internal Medicine

## 2023-05-11 ENCOUNTER — Ambulatory Visit (INDEPENDENT_AMBULATORY_CARE_PROVIDER_SITE_OTHER): Payer: Medicare Other | Admitting: Internal Medicine

## 2023-05-11 VITALS — BP 146/78 | HR 62 | Temp 98.3°F | Ht 60.0 in | Wt 193.0 lb

## 2023-05-11 DIAGNOSIS — R001 Bradycardia, unspecified: Secondary | ICD-10-CM | POA: Diagnosis not present

## 2023-05-11 DIAGNOSIS — I10 Essential (primary) hypertension: Secondary | ICD-10-CM | POA: Diagnosis not present

## 2023-05-11 DIAGNOSIS — N1832 Chronic kidney disease, stage 3b: Secondary | ICD-10-CM

## 2023-05-11 DIAGNOSIS — E7800 Pure hypercholesterolemia, unspecified: Secondary | ICD-10-CM

## 2023-05-11 DIAGNOSIS — E1165 Type 2 diabetes mellitus with hyperglycemia: Secondary | ICD-10-CM | POA: Diagnosis not present

## 2023-05-11 DIAGNOSIS — Z23 Encounter for immunization: Secondary | ICD-10-CM

## 2023-05-11 DIAGNOSIS — E875 Hyperkalemia: Secondary | ICD-10-CM

## 2023-05-11 DIAGNOSIS — R0683 Snoring: Secondary | ICD-10-CM

## 2023-05-11 DIAGNOSIS — H409 Unspecified glaucoma: Secondary | ICD-10-CM

## 2023-05-11 DIAGNOSIS — Z713 Dietary counseling and surveillance: Secondary | ICD-10-CM

## 2023-05-11 DIAGNOSIS — Z1231 Encounter for screening mammogram for malignant neoplasm of breast: Secondary | ICD-10-CM

## 2023-05-11 DIAGNOSIS — F439 Reaction to severe stress, unspecified: Secondary | ICD-10-CM

## 2023-05-11 DIAGNOSIS — Z7984 Long term (current) use of oral hypoglycemic drugs: Secondary | ICD-10-CM

## 2023-05-11 DIAGNOSIS — E039 Hypothyroidism, unspecified: Secondary | ICD-10-CM

## 2023-05-11 MED ORDER — LISINOPRIL 20 MG PO TABS: 20.0000 mg | ORAL_TABLET | Freq: Every day | ORAL | 1 refills | Status: DC

## 2023-05-11 MED ORDER — LEVOTHYROXINE SODIUM 100 MCG PO TABS: ORAL_TABLET | ORAL | 1 refills | Status: DC

## 2023-05-11 MED ORDER — EMPAGLIFLOZIN 25 MG PO TABS: ORAL_TABLET | ORAL | 1 refills | Status: DC

## 2023-05-11 NOTE — Progress Notes (Signed)
Subjective:    Patient ID: Lorraine Foley, female    DOB: 1940-12-25, 82 y.o.   MRN: [id number]  Patient here for  Chief Complaint  Patient presents with   Medication Management    HPI Here for follow up regarding diabetes, hypercholesterolemia and hypertension.  Saw cardiology 05/03/23.  Blood pressure elevated.  Recommended renal artery doppler and serum aldosterone plasma renin activity ratio.  Referred to pulmonary for evaluation for sleep study. 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  Planning echo. Saw Dr Thedore Mins 05/04/23.  Plan f/u renal ultrasound to f/u renal cyst.  Recommended continuing current medications.  She reports she is feeling ok.  No chest pain.  She has started walking.  Breathing stable. No cough or congestion.  No abdominal pain.  No bowel issues reported.  She is currently off spironolactone.  Taking hydrochlorothiazide. Blood pressure trending down.    Past Medical History:  Diagnosis Date   Arthritis    knees   Diabetes mellitus (HCC)    GERD (gastroesophageal reflux disease)    Glaucoma    Hypercholesterolemia    Hypertension    Hypothyroidism    Pneumonia    Past Surgical History:  Procedure Laterality Date   CATARACT EXTRACTION W/PHACO Left 11/09/2016   Procedure: CATARACT EXTRACTION PHACO AND INTRAOCULAR LENS PLACEMENT (IOC)  left diabetic complicated;  Surgeon: Lockie Mola, MD;  Location: [facility];  Service: Ophthalmology;  Laterality: Left;  Diabetic - oral meds   CATARACT EXTRACTION W/PHACO Right 03/08/2017   Procedure: CATARACT EXTRACTION PHACO AND INTRAOCULAR LENS PLACEMENT (IOC)  Right Diabetic complicated;  Surgeon: Lockie Mola, MD;  Location: [facility];  Service: Ophthalmology;  Laterality: Right;   EYE SURGERY Bilateral 08/2014, 2.2016   laser surgery in preparation for glaucoma surgery   KNEE ARTHROPLASTY Left 08/06/2018    Procedure: COMPUTER ASSISTED TOTAL KNEE ARTHROPLASTY;  Surgeon: Donato Heinz, MD;  Location: [facility];  Service: Orthopedics;  Laterality: Left;   TOTAL HIP ARTHROPLASTY  5/13   right   Family History  Problem Relation Age of Onset   Glaucoma Mother    Osteoporosis Mother    Mental illness Father        suicide   Liver disease Father        alcohol   Glaucoma Father    Alcohol abuse Father    Blindness Father    Diabetes Sister    Stroke Sister    Breast cancer Sister 8   Colon cancer Neg Hx    Social History   Socioeconomic History   Marital status: Divorced    Spouse name: Not on file   Number of children: 1   Years of education: Not on file   Highest education level: 12th grade  Occupational History   Not on file  Tobacco Use   Smoking status: Never   Smokeless tobacco: Never  Vaping Use   Vaping status: Never Used  Substance and Sexual Activity   Alcohol use: No    Alcohol/week: 0.0 standard drinks of alcohol   Drug use: No   Sexual activity: Not on file  Other Topics Concern   Not on file  Social History Narrative   Not on file   Social Determinants of Health   Financial Resource Strain: Low Risk  (05/08/2023)   Overall Financial Resource Strain (CARDIA)    Difficulty of Paying Living Expenses: Not very hard  Food Insecurity: No Food Insecurity (05/08/2023)   Hunger Vital Sign    Worried About Running Out of Food in the Last Year: Never true    Ran Out of Food in the Last Year: Never true  Transportation Needs: No Transportation Needs (05/08/2023)   PRAPARE - Administrator, Civil Service (Medical): No    Lack of Transportation (Non-Medical): No  Physical Activity: Unknown (05/08/2023)   Exercise Vital Sign    Days of Exercise per Week: 0 days    Minutes of Exercise per Session: Not on file  Stress: No Stress Concern Present (05/08/2023)   [employer] of Occupational Health - Occupational Stress Questionnaire    Feeling of Stress :  Only a little  Social Connections: Moderately Isolated (05/08/2023)   Social Connection and Isolation Panel [NHANES]    Frequency of Communication with Friends and Family: More than three times a week    Frequency of Social Gatherings with Friends and Family: More than three times a week    Attends Religious Services: More than 4 times per year    Active Member of [employer] or Organizations: No    Attends Engineer, structural: Not on file    Marital Status: Divorced     Review of Systems  Constitutional:  Negative for appetite change and unexpected weight change.  HENT:  Negative for congestion and sinus pressure.   Respiratory:  Negative for cough, chest tightness and shortness of breath.   Cardiovascular:  Negative for chest pain, palpitations and leg swelling.  Gastrointestinal:  Negative for abdominal pain, diarrhea, nausea and vomiting.  Genitourinary:  Negative for difficulty urinating and dysuria.  Musculoskeletal:  Negative for joint swelling and myalgias.  Skin:  Negative for color change and rash.  Neurological:  Negative for dizziness and headaches.  Psychiatric/Behavioral:  Negative for agitation and dysphoric mood.        Objective:     BP (!) 146/78   Pulse 62   Temp 98.3 F (36.8 C) (Oral)   Ht 5' (1.524 m)   Wt 193 lb (87.5 kg)   LMP 08/11/1995   SpO2 98%   BMI 37.69 kg/m  Wt Readings from Last 3 Encounters:  05/12/23 192 lb (87.1 kg)  05/11/23 193 lb (87.5 kg)  05/03/23 194 lb 4 oz (88.1 kg)    Physical Exam Vitals reviewed.  Constitutional:      General: She is not in acute distress.    Appearance: Normal appearance.  HENT:     Head: Normocephalic and atraumatic.     Right Ear: External ear normal.     Left Ear: External ear normal.  Eyes:     General: No scleral icterus.       Right eye: No discharge.        Left eye: No discharge.     Conjunctiva/sclera: Conjunctivae normal.  Neck:     Thyroid: No thyromegaly.  Cardiovascular:      Rate and Rhythm: Normal rate and regular rhythm.  Pulmonary:     Effort: No respiratory distress.     Breath sounds: Normal breath sounds. No wheezing.  Abdominal:     General: Bowel sounds are normal.     Palpations: Abdomen is soft.     Tenderness: There is no abdominal tenderness.  Musculoskeletal:        General: No swelling or tenderness.     Cervical back: Neck supple. No tenderness.  Lymphadenopathy:     Cervical: No cervical  adenopathy.  Skin:    Findings: No erythema or rash.  Neurological:     Mental Status: She is alert.  Psychiatric:        Mood and Affect: Mood normal.        Behavior: Behavior normal.      Outpatient Encounter Medications as of 05/11/2023  Medication Sig   acetaminophen (TYLENOL) 650 MG CR tablet Take 650 mg by mouth daily as needed for pain.   amLODipine (NORVASC) 5 MG tablet Take 1 tablet (5 mg total) by mouth 2 (two) times daily.   aspirin EC 81 MG tablet Take 81 mg by mouth daily. Swallow whole.   blood glucose meter kit and supplies Dispense based on patient and insurance preference. Use up to four times daily as directed. (FOR ICD-10 E10.9, E11.9).   Cholecalciferol (VITAMIN D-3) 1000 UNITS CAPS Take 1 capsule by mouth daily.   cloNIDine (CATAPRES) 0.1 MG tablet TAKE 1 TABLET(0.1 MG) BY MOUTH THREE TIMES DAILY   empagliflozin (JARDIANCE) 25 MG TABS tablet TAKE 1 TABLET(25 MG) BY MOUTH DAILY BEFORE BREAKFAST   glucosamine-chondroitin 500-400 MG tablet Take 1 tablet by mouth daily.   glucose blood (CONTOUR NEXT TEST) test strip TEST TWICE DAILY AS DIRECTED   hydrochlorothiazide (MICROZIDE) 12.5 MG capsule Take 1 capsule (12.5 mg total) by mouth daily.   Lancets (ONETOUCH ULTRASOFT) lancets Use as instructed to check blood sugars once daily. Dx E11.9   levothyroxine (SYNTHROID) 100 MCG tablet TAKE 1 TABLET(100 MCG) BY MOUTH DAILY   lisinopril (ZESTRIL) 20 MG tablet Take 1 tablet (20 mg total) by mouth daily.   ROCKLATAN 0.02-0.005 % SOLN Apply 1  drop to eye at bedtime.   rosuvastatin (CRESTOR) 20 MG tablet TAKE 1 TABLET(20 MG) BY MOUTH DAILY   timolol (TIMOPTIC) 0.25 % ophthalmic solution Place 1 drop into both eyes 2 (two) times daily.   Turmeric 500 MG CAPS Take 1 capsule by mouth daily.   vitamin C (ASCORBIC ACID) 500 MG tablet Take 500 mg by mouth daily.   vitamin E 200 UNIT capsule Take 200 Units by mouth daily.   [DISCONTINUED] empagliflozin (JARDIANCE) 25 MG TABS tablet TAKE 1 TABLET(25 MG) BY MOUTH DAILY BEFORE BREAKFAST   [DISCONTINUED] levothyroxine (SYNTHROID) 100 MCG tablet TAKE 1 TABLET(100 MCG) BY MOUTH DAILY   [DISCONTINUED] lisinopril (ZESTRIL) 20 MG tablet Take 1 tablet (20 mg total) by mouth daily.   [DISCONTINUED] patiromer (VELTASSA) 8.4 g packet Take 1 packet (8.4 g total) by mouth daily. (Patient not taking: Reported on 05/12/2023)   No facility-administered encounter medications on file as of 05/11/2023.     Lab Results  Component Value Date   WBC 6.7 01/10/2023   HGB 13.4 01/10/2023   HCT 39.6 01/10/2023   PLT 257.0 01/10/2023   GLUCOSE 182 (H) 05/03/2023   CHOL 143 01/10/2023   TRIG 179.0 (H) 01/10/2023   HDL 45.20 01/10/2023   LDLDIRECT 80.0 04/28/2022   LDLCALC 62 01/10/2023   ALT 13 01/10/2023   AST 14 01/10/2023   NA 141 05/03/2023   K 5.1 05/03/2023   CL 100 05/03/2023   CREATININE 1.20 (H) 05/03/2023   BUN 26 05/03/2023   CO2 32 (H) 05/03/2023   TSH 0.87 01/10/2023   INR 1.00 07/24/2018   HGBA1C 8.2 (H) 01/10/2023   MICROALBUR 1.0 10/12/2022       Assessment & Plan:  Type 2 diabetes mellitus with hyperglycemia, without long-term current use of insulin (HCC) Assessment & Plan: Discussed diet and exercise.  Low carb diet.  She has been watching diet.  More active - walking more.Follow met b and a1c.  Continues on jardiance.  Stay hydrated.   Orders: -     Hemoglobin A1c -     Amb ref to Medical Nutrition Therapy-MNT  Hypercholesterolemia Assessment & Plan: Continue pravastatin.   Low cholesterol diet and exercise.  Follow lipid panel and liver function tests.    Orders: -     Lipid panel -     Basic metabolic panel -     Hepatic function panel  Visit for screening mammogram -     3D Screening Mammogram, Left and Right; Future  Primary hypertension Assessment & Plan: Continues on amlodipine, clonidine and lisinopril 20mg  q day.  Back on hydrochlorothiazide. Off spironolactone with w/up planned as outlined -  renal artery doppler and serum aldosterone plasma renin activity ratio. Her blood pressure previously has been hard to control.   Blood pressure elevated as outlined.  Recheck improved.  Continues on jardiance.  Stay hydrated. Hold on making changes since just added back hydrochlorothiazide. Follow pressures and send in readings.  Consider hypertension clinic.  Orders: -     Amb ref to Medical Nutrition Therapy-MNT  Hyperkalemia -     Amb ref to Medical Nutrition Therapy-MNT  Need for influenza vaccination -     Flu Vaccine Trivalent High Dose (Fluad)  Bradycardia Assessment & Plan: Saw cardiology (and EP). 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     Stage 3b chronic kidney disease (HCC) Assessment & Plan: Continue to avoid antiinflammatories.  Continue ace inhibitor.  Is off spironolactone currently.  Back on hydrochlorothiazide.  Planning renal artery doppler and serum aldosterone plasma renin activity ratio. Follow metabolic panel.  Continue f/u with nephrology.     Glaucoma, unspecified glaucoma type, unspecified laterality Assessment & Plan: Followed by opthalmology. Discuss timolol with ophthalmology.    Stress Assessment & Plan: Overall appears to be handling things relatively well. Follow.    Snoring Assessment & Plan: Planning for HST.    Hypothyroidism, unspecified type Assessment & Plan: On thyroid replacement.  Follow tsh.    Dietary  counseling Assessment & Plan: She is trying to watch sodium and potassium in her foods.  Also monitoring carbs. Request referral to a nutritionist.     Other orders -     Empagliflozin; TAKE 1 TABLET(25 MG) BY MOUTH DAILY BEFORE BREAKFAST  Dispense: 90 tablet; Refill: 1 -     Levothyroxine Sodium; TAKE 1 TABLET(100 MCG) BY MOUTH DAILY  Dispense: 90 tablet; Refill: 1 -     Lisinopril; Take 1 tablet (20 mg total) by mouth daily.  Dispense: 90 tablet; Refill: 1     Dale Kempton, MD

## 2023-05-12 ENCOUNTER — Encounter: Payer: Self-pay | Admitting: Pulmonary Disease

## 2023-05-12 ENCOUNTER — Ambulatory Visit: Payer: Medicare Other | Admitting: Pulmonary Disease

## 2023-05-12 ENCOUNTER — Encounter: Payer: Self-pay | Admitting: Internal Medicine

## 2023-05-12 VITALS — BP 140/66 | HR 67 | Temp 98.1°F | Ht 60.0 in | Wt 192.0 lb

## 2023-05-12 DIAGNOSIS — R0683 Snoring: Secondary | ICD-10-CM

## 2023-05-12 NOTE — Progress Notes (Signed)
Pulmonary, Critical Care, and Sleep Medicine  Chief Complaint  Patient presents with   Consult    Restless sleep. Sleepiness during the day and tiredness.     Past Surgical History:  She  has a past surgical history that includes Total hip arthroplasty (5/13); Eye surgery (Bilateral, 08/2014, 2.2016); Cataract extraction w/PHACO (Left, 11/09/2016); Cataract extraction w/PHACO (Right, 03/08/2017); and Knee Arthroplasty (Left, 08/06/2018).  Past Medical History:  Arthritis, DM type 2, GERD, Glaucoma, HLD, HTN, Hypothyroidism, PNA  Constitutional:  BP (!) 140/66 (BP Location: Left Arm, Patient Position: Sitting, Cuff Size: Normal)   Pulse 67   Temp 98.1 F (36.7 C) (Temporal)   Ht 5' (1.524 m)   Wt 192 lb (87.1 kg)   LMP 08/11/1995   SpO2 99%   BMI 37.50 kg/m   Brief Summary:  Lorraine Foley is a 82 y.o. female with snoring.      Subjective:   She was seen recently by cardiology for refractory hypertension.  Noted to have trouble with her sleep and advised to have further sleep assessment.  Has been an issue for years.  She gets sleepy when reading and naps for about 45 minutes to an hour.  She has trouble sleeping on her back.  She goes to sleep at 10 pm.  She falls asleep in 20 minutes.  She wakes up 3 to 4 times to use the bathroom.  She gets out of bed at 7 am.  She feels okay in the morning.  She denies morning headache.  She does not use anything to help her fall sleep or stay awake.  She denies sleep walking, sleep talking, bruxism, or nightmares.  There is no history of restless legs.  She denies sleep hallucinations, sleep paralysis, or cataplexy.  The Epworth score is 3 out of 24.   Physical Exam:   Appearance - well kempt, mild kyphosis  ENMT - no sinus tenderness, no oral exudate, no LAN, Mallampati 4 airway, no stridor  Respiratory - equal breath sounds bilaterally, no wheezing or rales  CV - s1s2 regular rate and rhythm, no murmurs  Ext - no  clubbing, no edema  Skin - no rashes  Psych - normal mood and affect   Sleep Tests:    Cardiac Tests:    Social History:  She  reports that she has never smoked. She has never used smokeless tobacco. She reports that she does not drink alcohol and does not use drugs.  Family History:  Her family history includes Alcohol abuse in her father; Blindness in her father; Breast cancer (age of onset: 33) in her sister; Diabetes in her sister; Glaucoma in her father and mother; Liver disease in her father; Mental illness in her father; Osteoporosis in her mother; Stroke in her sister.    Discussion:  She has snoring, sleep disruption, apnea and daytime sleepiness.  She has history of hypertension.  Her BMI is > 35.  I am concerned she could have obstructive sleep apnea.  Assessment/Plan:   Snoring with excessive daytime sleepiness. - will need to arrange for a home sleep study  Obesity. - discussed how weight can impact sleep and risk for sleep disordered breathing - discussed options to assist with weight loss: combination of diet modification, cardiovascular and strength training exercises  Cardiovascular risk. - had an extensive discussion regarding the adverse health consequences related to untreated sleep disordered breathing - specifically discussed the risks for hypertension, coronary artery disease, cardiac dysrhythmias, cerebrovascular disease, and  diabetes - lifestyle modification discussed  Safe driving practices. - discussed how sleep disruption can increase risk of accidents, particularly when driving - safe driving practices were discussed  Therapies for obstructive sleep apnea. - if the sleep study shows significant sleep apnea, then various therapies for treatment were reviewed: CPAP, oral appliance, and surgical interventions   Time Spent Involved in Patient Care on Day of Examination:  36 minutes  Follow up:   Patient Instructions  Will arrange for a home  sleep study Will call to arrange for follow up after sleep study reviewed   Medication List:   Allergies as of 05/12/2023       Reactions   Metformin And Related Other (See Comments)   Swelling of the lips        Medication List        Accurate as of May 12, 2023  2:49 PM. If you have any questions, ask your nurse or doctor.          acetaminophen 650 MG CR tablet Commonly known as: TYLENOL Take 650 mg by mouth daily as needed for pain.   amLODipine 5 MG tablet Commonly known as: NORVASC Take 1 tablet (5 mg total) by mouth 2 (two) times daily.   ascorbic acid 500 MG tablet Commonly known as: VITAMIN C Take 500 mg by mouth daily.   aspirin EC 81 MG tablet Take 81 mg by mouth daily. Swallow whole.   blood glucose meter kit and supplies Dispense based on patient and insurance preference. Use up to four times daily as directed. (FOR ICD-10 E10.9, E11.9).   cloNIDine 0.1 MG tablet Commonly known as: CATAPRES TAKE 1 TABLET(0.1 MG) BY MOUTH THREE TIMES DAILY   Contour Next Test test strip Generic drug: glucose blood TEST TWICE DAILY AS DIRECTED   empagliflozin 25 MG Tabs tablet Commonly known as: Jardiance TAKE 1 TABLET(25 MG) BY MOUTH DAILY BEFORE BREAKFAST   glucosamine-chondroitin 500-400 MG tablet Take 1 tablet by mouth daily.   hydrochlorothiazide 12.5 MG capsule Commonly known as: MICROZIDE Take 1 capsule (12.5 mg total) by mouth daily.   levothyroxine 100 MCG tablet Commonly known as: SYNTHROID TAKE 1 TABLET(100 MCG) BY MOUTH DAILY   lisinopril 20 MG tablet Commonly known as: ZESTRIL Take 1 tablet (20 mg total) by mouth daily.   onetouch ultrasoft lancets Use as instructed to check blood sugars once daily. Dx E11.9   Rocklatan 0.02-0.005 % Soln Generic drug: Netarsudil-Latanoprost Apply 1 drop to eye at bedtime.   rosuvastatin 20 MG tablet Commonly known as: CRESTOR TAKE 1 TABLET(20 MG) BY MOUTH DAILY   timolol 0.25 % ophthalmic  solution Commonly known as: TIMOPTIC Place 1 drop into both eyes 2 (two) times daily.   Turmeric 500 MG Caps Take 1 capsule by mouth daily.   Veltassa 8.4 g packet Generic drug: patiromer Take 1 packet (8.4 g total) by mouth daily.   Vitamin D-3 25 MCG (1000 UT) Caps Take 1 capsule by mouth daily.   vitamin E 200 UNIT capsule Take 200 Units by mouth daily.        Signature:  Coralyn Helling, MD [facility] Pulmonary/Critical Care Pager - [phone] 05/12/2023, 2:49 PM

## 2023-05-12 NOTE — Patient Instructions (Signed)
Will arrange for a home sleep study Will call to arrange for follow up after sleep study reviewed

## 2023-05-13 ENCOUNTER — Encounter: Payer: Self-pay | Admitting: Internal Medicine

## 2023-05-13 DIAGNOSIS — Z713 Dietary counseling and surveillance: Secondary | ICD-10-CM | POA: Insufficient documentation

## 2023-05-13 NOTE — Assessment & Plan Note (Signed)
Discussed diet and exercise.  Low carb diet.  She has been watching diet.  More active - walking more.Follow met b and a1c.  Continues on jardiance.  Stay hydrated.

## 2023-05-13 NOTE — Assessment & Plan Note (Signed)
She is trying to watch sodium and potassium in her foods.  Also monitoring carbs. Request referral to a nutritionist.

## 2023-05-13 NOTE — Assessment & Plan Note (Signed)
Continue pravastatin.  Low cholesterol diet and exercise.  Follow lipid panel and liver function tests.

## 2023-05-13 NOTE — Assessment & Plan Note (Signed)
Low carb diet and exercise.  Follow met b and a1c. On jardiance.  Walking more.

## 2023-05-13 NOTE — Assessment & Plan Note (Signed)
Followed by opthalmology. Discuss timolol with ophthalmology.

## 2023-05-13 NOTE — Assessment & Plan Note (Signed)
Overall appears to be handling things relatively well.  Follow.

## 2023-05-13 NOTE — Assessment & Plan Note (Signed)
Planning for HST.

## 2023-05-13 NOTE — Assessment & Plan Note (Signed)
Continues on amlodipine, clonidine and lisinopril 20mg  q day.  Back on hydrochlorothiazide. Off spironolactone with w/up planned as outlined -  renal artery doppler and serum aldosterone plasma renin activity ratio. Her blood pressure previously has been hard to control.   Blood pressure elevated as outlined.  Recheck improved.  Continues on jardiance.  Stay hydrated. Hold on making changes since just added back hydrochlorothiazide. Follow pressures and send in readings.  Consider hypertension clinic.

## 2023-05-13 NOTE — Assessment & Plan Note (Signed)
On thyroid replacement.  Follow tsh.  

## 2023-05-13 NOTE — Assessment & Plan Note (Signed)
Continue to avoid antiinflammatories.  Continue ace inhibitor.  Is off spironolactone currently.  Back on hydrochlorothiazide.  Planning renal artery doppler and serum aldosterone plasma renin activity ratio. Follow metabolic panel.  Continue f/u with nephrology.

## 2023-05-13 NOTE — Assessment & Plan Note (Signed)
Saw cardiology (and EP). 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

## 2023-05-19 ENCOUNTER — Other Ambulatory Visit
Admission: RE | Admit: 2023-05-19 | Discharge: 2023-05-19 | Disposition: A | Discharge status: Home / Self Care | Payer: Medicare Other | Source: Ambulatory Visit | Attending: Internal Medicine | Admitting: *Deleted

## 2023-05-19 DIAGNOSIS — E7800 Pure hypercholesterolemia, unspecified: Secondary | ICD-10-CM | POA: Insufficient documentation

## 2023-05-19 DIAGNOSIS — E1165 Type 2 diabetes mellitus with hyperglycemia: Secondary | ICD-10-CM | POA: Insufficient documentation

## 2023-05-19 DIAGNOSIS — I10 Essential (primary) hypertension: Secondary | ICD-10-CM | POA: Diagnosis not present

## 2023-05-19 LAB — BASIC METABOLIC PANEL
Anion gap: 12 (ref 5–15)
BUN: 47 mg/dL — ABNORMAL HIGH (ref 8–23)
CO2: 27 mmol/L (ref 22–32)
Calcium: 10.4 mg/dL — ABNORMAL HIGH (ref 8.9–10.3)
Chloride: 100 mmol/L (ref 98–111)
Creatinine, Ser: 1.22 mg/dL — ABNORMAL HIGH (ref 0.44–1.00)
GFR, Estimated: 44 mL/min — ABNORMAL LOW (ref >60)
Glucose, Bld: 148 mg/dL — ABNORMAL HIGH (ref 70–99)
Potassium: 4.1 mmol/L (ref 3.5–5.1)
Sodium: 139 mmol/L (ref 135–145)

## 2023-05-19 LAB — HEPATIC FUNCTION PANEL
ALT: 19 U/L (ref 0–44)
AST: 19 U/L (ref 15–41)
Albumin: 4.5 g/dL (ref 3.5–5.0)
Alkaline Phosphatase: 53 U/L (ref 38–126)
Bilirubin, Direct: <0.1 mg/dL (ref 0.0–0.2)
Total Bilirubin: 0.9 mg/dL (ref 0.3–1.2)
Total Protein: 8 g/dL (ref 6.5–8.1)

## 2023-05-19 LAB — HEMOGLOBIN A1C
Hgb A1c MFr Bld: 7.7 % — ABNORMAL HIGH (ref 4.8–5.6)
Mean Plasma Glucose: 174.29 mg/dL

## 2023-05-19 LAB — LIPID PANEL
Cholesterol: 159 mg/dL (ref 0–200)
HDL: 53 mg/dL (ref >40)
LDL Cholesterol: 78 mg/dL (ref 0–99)
Total CHOL/HDL Ratio: 3 RATIO
Triglycerides: 138 mg/dL (ref <150)
VLDL: 28 mg/dL (ref 0–40)

## 2023-05-22 ENCOUNTER — Encounter: Payer: Self-pay | Admitting: Dietician

## 2023-05-22 ENCOUNTER — Encounter: Payer: Medicare Other | Attending: Internal Medicine | Admitting: Dietician

## 2023-05-22 VITALS — Ht 60.0 in | Wt 194.1 lb

## 2023-05-22 DIAGNOSIS — E1165 Type 2 diabetes mellitus with hyperglycemia: Secondary | ICD-10-CM | POA: Insufficient documentation

## 2023-05-22 DIAGNOSIS — N1832 Chronic kidney disease, stage 3b: Secondary | ICD-10-CM

## 2023-05-22 DIAGNOSIS — Z6837 Body mass index (BMI) 37.0-37.9, adult: Secondary | ICD-10-CM | POA: Insufficient documentation

## 2023-05-22 DIAGNOSIS — I10 Essential (primary) hypertension: Secondary | ICD-10-CM | POA: Diagnosis not present

## 2023-05-22 DIAGNOSIS — Z6839 Body mass index (BMI) 39.0-39.9, adult: Secondary | ICD-10-CM

## 2023-05-22 DIAGNOSIS — Z713 Dietary counseling and surveillance: Secondary | ICD-10-CM | POA: Insufficient documentation

## 2023-05-22 DIAGNOSIS — E875 Hyperkalemia: Secondary | ICD-10-CM | POA: Insufficient documentation

## 2023-05-22 NOTE — Patient Instructions (Signed)
Measure portions of starchy foods and keep to 1/2 - 1 cup for each meal.  Keep portions of protein foods to 1/4 - 1/2 cup or palm-size.  Limit high potassium foods to 1-2 servings a day or less.

## 2023-05-22 NOTE — Progress Notes (Signed)
Medical Nutrition Therapy: Visit start time: 1330  end time: 1430  Assessment:   Referral Diagnosis: Type 2 Diabetes, CKD stage 3b, hyperkalemia, HTN Other medical history/ diagnoses: hypothyroidism, osteoarthritis, HLD,  Psychosocial issues/ stress concerns: none  Medications, supplements: reconciled list in medical record   Current weight: 194.1lbs Height: 5'0" BMI: 37.91  Progress and evaluation:  Patient reports working to decrease potassium intake after hyperkalemia, to the point of easing up on other foods ie eating more bread (as low potassium food). She states she was also on a medication that could have increased blood levels; she is no longer taking this medication. Lives with niece and nephew; niece follows vegan diet. Nephew has diabetes.  Recent lab results indicate HbA1C 7.7%, GFR 40, sodium 139, potassium 4.1, calcium 10.4, BUN 47 Food allergies: none Special diet practices: none Patient seeks help with coordinating diet for managing diabetes and renal disease, HTN and HLD Next PCP appt is 08/2023   Dietary Intake:  Usual eating pattern includes 3 meals and 1-2 snacks per day. Dining out frequency: ? meals per week. Who plans meals/ buys groceries? Self, niece/ nephew Who prepares meals? Self, niece/ nephew  Breakfast: oatmeal/ cereal with milk; boiled egg white Snack: atkins bar Lunch: salad with lettuce, cucumber, green beans, black olives (stopped tomatoes) no bread/ crackers, sometimes egg white; or toasted cheese sandwich Snack: popcorn (microwave bag); occ ice cream Supper: salad if not at lunch; or sandwich Snack: apple with cheese Beverages: water, hot tea, crystal light lemonade, diet cranberry juice, stopped sodas  Physical activity: walk at park (uses walker) 15 minutes 2x a week + chair yoga 15 minutes 2-3 times a week   Intervention:   Nutrition Care Education:   Basic nutrition: basic food groups; appropriate nutrient balance; appropriate meal and  snack schedule; general nutrition guidelines    Weight control: estimated energy needs for weight loss at 1300-1400kcal, provided guidance for 50% CHO, 20% pro, (30% fat) Advanced nutrition: food label reading Diabetes:  appropriate meal and snack schedule; appropriate carb intake and balance, healthy carb choices; role of fiber, protein;  physical activity Hypertension: identifying high sodium foods, goal for sodium intake;  options for seasoning foods CKD: goal for limiting potassium intake, high and low potassium choices and avoiding large portions and frequent consumption of high potassium foods; goal for sodium intake and limiting high sodium choices; controlling protein intake by choosing small portions of high protein foods, but including a protein source regularly with meals.   Other intervention notes: Patient has been working on dietary changes to avoid elevated potassium and control blood sugar. She is motivated to continue. Established goals for adequate nutrition and balance.  No follow up scheduled at this time; patient to schedule later as needed.   Nutritional Diagnosis:  [place]-2.1 Inpaired nutrition utilization and [place]-2.2 Altered nutrition-related laboratory As related to Type 2 diabetes, CKD and HTN.  As evidenced by elevated HbA1C, history of hyperkalemia, low GFR, BP controlled with medication. [place]-3.3 Overweight/obesity As related to hypothyroidism, history of excess calories and/or inadequate physical activity.  As evidenced by patient with current BMI of 37.9.   Education Materials given:  Designer, industrial/product with food lists, sample meal pattern Kidney Disease Pyramid ([place]) Potassium Content of Foods (NCM) Visit summary with goals/ instructions   Learner/ who was taught:  Patient   Level of understanding: Verbalizes/ demonstrates competency  Demonstrated degree of understanding via:   Teach back Learning barriers: None  Willingness to learn/ readiness for  change: Eager,  change in progress  Monitoring and Evaluation:  Dietary intake, exercise, renal disease, diabetes, and body weight      follow up: prn

## 2023-05-25 DIAGNOSIS — G4730 Sleep apnea, unspecified: Secondary | ICD-10-CM | POA: Diagnosis not present

## 2023-06-06 ENCOUNTER — Ambulatory Visit (INDEPENDENT_AMBULATORY_CARE_PROVIDER_SITE_OTHER): Payer: Medicare Other

## 2023-06-06 ENCOUNTER — Ambulatory Visit: Payer: Medicare Other | Attending: Internal Medicine

## 2023-06-06 DIAGNOSIS — I10 Essential (primary) hypertension: Secondary | ICD-10-CM

## 2023-06-06 DIAGNOSIS — R0609 Other forms of dyspnea: Secondary | ICD-10-CM

## 2023-06-06 LAB — ECHOCARDIOGRAM COMPLETE
AR max vel: 2.16 cm2
AV Area VTI: 2.17 cm2
AV Area mean vel: 2.13 cm2
AV Mean grad: 4 mm[Hg]
AV Peak grad: 7.1 mm[Hg]
Ao pk vel: 1.33 m/s
Area-P 1/2: 3.65 cm2
S' Lateral: 3.3 cm
Single Plane A4C EF: 52 %

## 2023-06-08 ENCOUNTER — Encounter: Payer: Self-pay | Admitting: Cardiology

## 2023-06-08 ENCOUNTER — Ambulatory Visit: Payer: Medicare Other | Attending: Cardiology | Admitting: Cardiology

## 2023-06-08 VITALS — BP 150/65 | HR 50 | Ht 60.0 in | Wt 193.2 lb

## 2023-06-08 DIAGNOSIS — E782 Mixed hyperlipidemia: Secondary | ICD-10-CM

## 2023-06-08 DIAGNOSIS — R0609 Other forms of dyspnea: Secondary | ICD-10-CM

## 2023-06-08 DIAGNOSIS — R001 Bradycardia, unspecified: Secondary | ICD-10-CM

## 2023-06-08 DIAGNOSIS — I701 Atherosclerosis of renal artery: Secondary | ICD-10-CM

## 2023-06-08 DIAGNOSIS — E1165 Type 2 diabetes mellitus with hyperglycemia: Secondary | ICD-10-CM | POA: Diagnosis not present

## 2023-06-08 DIAGNOSIS — E039 Hypothyroidism, unspecified: Secondary | ICD-10-CM

## 2023-06-08 DIAGNOSIS — I10 Essential (primary) hypertension: Secondary | ICD-10-CM

## 2023-06-08 NOTE — Patient Instructions (Signed)
Medication Instructions:  Your physician recommends that you continue on your current medications as directed. Please refer to the Current Medication list given to you today.  *If you need a refill on your cardiac medications before your next appointment, please call your pharmacy*  Lab Work: -None ordered  Testing/Procedures: -None ordered  Follow-Up: At [facility], you and your health needs are our priority.  As part of our continuing mission to provide you with exceptional heart care, we have created designated Provider Care Teams.  These Care Teams include your primary Cardiologist (physician) and Advanced Practice Providers (APPs -  Physician Assistants and Nurse Practitioners) who all work together to provide you with the care you need, when you need it.  Your next appointment:   3 month(s)  Provider:   Yvonne Kendall, MD    Other Instructions -None

## 2023-06-08 NOTE — Progress Notes (Signed)
Cardiology Office Note:  .   Date:  06/08/2023  ID:  Lorraine Foley, DOB 1941/07/20, MRN [id number] PCP: Dale Redway, MD  [facility] Providers Cardiologist:  Yvonne Kendall, MD Electrophysiologist:  Lanier Prude, MD    History of Present Illness: .   Lorraine Foley is a 82 y.o. female with past medical history of hypertension, type 2 diabetes, hypercholesterolemia, GERD, diabetes, hypothyroidism, CKD stage III, who is here today for follow-up.  Previous event monitor in 01/11/2023 revealed predominantly sinus rhythm with junctional rhythm with 10 episodes of NSVT.  Rare PACs and PVCs were noted.  She was previously evaluated by EP Dr. Lalla Brothers in June 2024 for evaluation of asymptomatic bradycardia.  It was thought that clonidine was contributed to this.  She had also had issues with hyperkalemia on concurrent lisinopril and spironolactone.  Dr. Lalla Brothers did not make any medication changes but suggested referral to hypertension clinic in [place].  She was last seen in clinic 9//24 by Dr. Okey Dupre.  She was denying chest pain, palpitations, lightheadedness, or peripheral edema.  She initially denied dyspnea that was further questioned as she gets out of breath easily when she walks an extended distance.  She stated this has been present over the last 6 months and had not worsened.  She had noted that her blood pressure been fairly well-controlled until May with Dr. Lorin Picket had discontinued her HCTZ due to AKI and hyperkalemia.  Spironolactone and lisinopril were both continue with the addition about thousand help with hyperkalemia.  There was also questionable workup for obstructive sleep apnea but she did not wish to move forward with that.  She was scheduled for renal artery ultrasound, she was to hold her spironolactone for couple of weeks to measure serum aldosterone and plasma renin activity ratio.  At she was restarted on HCTZ 12.5 mg daily with the discontinuation of spironolactone.   She was also scheduled for an echocardiogram to evaluate her dyspnea.  She returns to clinic today accompanied by a family member. She states that she has been doing well. She has had several follow-ups with various providers. Since the previous changes to her blood pressure medications her potassium and blood pressures have been better controlled.  She stopped Veltassa as she stated it was giving her GI side effects.  She has followed with pulmonary and had a sleep study and is waiting for the results of that test as well.  She denies any chest pain, palpitations, peripheral edema, lightheadedness/dizziness, or visual changes with the exception of her cataracts.  She occasionally has dyspnea when she walks for long distances but that is not unchanged in the past year.  States that she has been compliant with her current medications.  Denies any hospitalizations or visits to the emergency department.  ROS: 10 point review of systems has been reviewed and considered negative with exception what is been listed in the HPI  Studies Reviewed: Marland Kitchen       TTE 06/06/23 1. Left ventricular ejection fraction, by estimation, is 60 to 65%. The  left ventricle has normal function. The left ventricle has no regional  wall motion abnormalities. Left ventricular diastolic parameters are  consistent with Grade I diastolic  dysfunction (impaired relaxation).   2. Right ventricular systolic function is normal. The right ventricular  size is normal. There is moderately elevated pulmonary artery systolic  pressure. The estimated right ventricular systolic pressure is 48.8 mmHg.   3. The mitral valve is normal in structure. No  evidence of mitral valve  regurgitation. No evidence of mitral stenosis.   4. The aortic valve is normal in structure. Aortic valve regurgitation is  not visualized. No aortic stenosis is present.   5. The inferior vena cava is normal in size with greater than 50%  respiratory variability,  suggesting right atrial pressure of 3 mmHg.    Renal Artery Ultrasound 06/06/2023 Summary:  Renal:  Left: Normal size of left kidney. Abnormal left Resisitve Index.        Normal cortical thickness of the left kidney. Evidence of a >       60% stenosis in the left renal artery. LRV flow present.  Mesenteric: Normal Celiac artery findings.     Risk Assessment/Calculations:          Physical Exam:   VS:  BP (!) 150/65 (BP Location: Left Arm, Patient Position: Sitting, Cuff Size: Normal)   Pulse (!) 50   Ht 5' (1.524 m)   Wt 193 lb 3.2 oz (87.6 kg)   LMP 08/11/1995   SpO2 98%   BMI 37.73 kg/m    Wt Readings from Last 3 Encounters:  06/08/23 193 lb 3.2 oz (87.6 kg)  05/22/23 194 lb 1.6 oz (88 kg)  05/12/23 192 lb (87.1 kg)    GEN: Well nourished, well developed in no acute distress NECK: No JVD; No carotid bruits CARDIAC: RRR, no murmurs, rubs, gallops RESPIRATORY:  Clear to auscultation without rales, wheezing or rhonchi  ABDOMEN: Soft, non-tender, non-distended EXTREMITIES:  No edema; No deformity   ASSESSMENT AND PLAN: .   Uncontrolled hypertension with a blood pressure today 150/65.  Patient continues to monitor blood pressures at home and notices more elevated readings in the 1 she was previously having.  She has been continued on amlodipine 5 mg twice daily, clonidine 0.1 mg 3 times daily, HCTZ 12.5 mg daily, lisinopril 20 mg daily.  Renal artery ultrasound showed greater than 60% stenosis of the left renal artery the right renal artery was not well-visualized.  Blood work was drawn by her PCP revealed stable creatinine and stable potassium.  Unfortunately her aldosterone renin activity has not been collected.  She has also recently had a sleep study completed with pulmonary and is awaiting thoughts.  If she has continued uncontrolled hypertension on return we will consider sending her to hypertension clinic in [place] as well as can consider renal denervation.  Consider  repeating renal artery ultrasound in 1 year for progression of left renal artery stenosis.  Sinus bradycardia this continues today with a rate of 54 patient has asymptomatic.  Previously seen by Dr. Lalla Brothers EP who did not recommend any further intervention thought was noted to clonidine could be contributing to her bradycardia.  She is still to reach out about her eyedrops with her ophthalmologist.  Will continue to monitor heart rate and surveillance EKGs for rhythm changes.  Chronic dyspnea on exertion that has been for the past several months to years.  She had an echocardiogram that was completed that revealed LVEF of 60 to 65%, no RWMA, G1 DD, moderately elevated pulmonary artery systolic pressures without concern for valvular abnormalities.  Type 2 diabetes which she is continued on Jardiance 25 mg daily.  This continues to be managed by her PCP.  Hypothyroidism where she is continued on levothyroxine 100 mcg daily.  This is also continued by her PCP.  Mixed hyperlipidemia with an LDL of 78.  She is continued on rosuvastatin 20 mg daily.  Dispo: Patient to return to clinic to see primary cardiologist Dr. Okey Dupre in 3 months or sooner if needed for reevaluation of symptoms and follow-up on completion of testing.  Signed, Ivry Pigue, NP

## 2023-06-19 ENCOUNTER — Ambulatory Visit
Admission: RE | Admit: 2023-06-19 | Discharge: 2023-06-19 | Disposition: A | Discharge status: Home / Self Care | Payer: Medicare Other | Source: Ambulatory Visit | Attending: Internal Medicine | Admitting: Internal Medicine

## 2023-06-19 DIAGNOSIS — Z1231 Encounter for screening mammogram for malignant neoplasm of breast: Secondary | ICD-10-CM | POA: Insufficient documentation

## 2023-06-26 ENCOUNTER — Telehealth: Payer: Self-pay | Admitting: Pulmonary Disease

## 2023-06-26 DIAGNOSIS — G4733 Obstructive sleep apnea (adult) (pediatric): Secondary | ICD-10-CM | POA: Diagnosis not present

## 2023-06-26 NOTE — Telephone Encounter (Signed)
VS pt HST showed mild  OSA with AHI 15/ hr & low sat 84% Suggest  OV with APP to discuss options - virtual ok

## 2023-06-26 NOTE — Telephone Encounter (Signed)
Lm x1 for patient.   Please schedule OV.

## 2023-06-28 NOTE — Telephone Encounter (Signed)
Lm x2 for patient.  Mychart message sent.  Will close encounter per office protocol.

## 2023-08-21 ENCOUNTER — Encounter: Payer: Self-pay | Admitting: Nurse Practitioner

## 2023-08-21 ENCOUNTER — Ambulatory Visit: Payer: Medicare Other | Admitting: Nurse Practitioner

## 2023-08-21 VITALS — BP 146/76 | HR 65 | Temp 97.7°F | Ht 60.0 in | Wt 193.8 lb

## 2023-08-21 DIAGNOSIS — E66812 Obesity, class 2: Secondary | ICD-10-CM | POA: Diagnosis not present

## 2023-08-21 DIAGNOSIS — E6609 Other obesity due to excess calories: Secondary | ICD-10-CM

## 2023-08-21 DIAGNOSIS — Z6837 Body mass index (BMI) 37.0-37.9, adult: Secondary | ICD-10-CM

## 2023-08-21 DIAGNOSIS — I10 Essential (primary) hypertension: Secondary | ICD-10-CM | POA: Diagnosis not present

## 2023-08-21 DIAGNOSIS — G4733 Obstructive sleep apnea (adult) (pediatric): Secondary | ICD-10-CM | POA: Insufficient documentation

## 2023-08-21 NOTE — Progress Notes (Signed)
@Patient  ID: Lorraine Foley, female    DOB: [birth year], 82 y.o.   MRN: [id number]  Chief Complaint  Patient presents with   Follow-up    05/25/23 HST.     Referring provider: Dale Capron, MD  HPI: 82 year old female, never smoker followed for mild OSA. She is a former patient of Dr. Evlyn Courier and last seen in office 05/12/2023. Past medical history significant for HTN, GERD, DM, hypothyroid, CKD, HLD.  TEST/EVENTS:  05/25/2023 HST: AHI 14.7/h, SpO2 low 83%  05/12/2023: OV with Dr. Craige Cotta. Seen by cardiology for refractory HTN. Noted to have trouble with his sleep and advised to have evaluation. Issues for years. Gets sleepy with reading and naps for about 45 min-1 hr. Trouble sleeping on her back. HST ordered for further evaluation.   08/21/2023: Today - follow up Discussed the use of AI scribe software for clinical note transcription with the patient, who gave verbal consent to proceed.  History of Present Illness   The patient presents with concerns about sleep disturbances and refractory HTN. She had a sleep consult in September of this year with Dr. Craige Cotta. She then had a home sleep study, which revealed mildly moderate sleep apnea. She reports inconsistent sleep patterns, with an average of seven hours of sleep per night, albeit fragmented due to frequent nocturnal urination. The patient describes waking up every two hours to urinate, which she attributes to high fluid intake as advised by her doctors. She also mentions a habit of drinking tea before bedtime. She does feel tired during the day. Unsure if she snores. Never woken up gasping or choking.   She also denies any instances of sleepwalking or drowsy driving. However, she does report occasional morning headaches, which she believes are related to her glaucoma and the associated eye drops.  She expresses willingness to try CPAP therapy as a treatment option.       Allergies  Allergen Reactions   Metformin And Related Other (See  Comments)    Swelling of the lips    Immunization History  Administered Date(s) Administered   Fluad Quad(high Dose 65+) 05/13/2019   Fluad Trivalent(High Dose 65+) 05/11/2023   Influenza Split 07/14/2014   Influenza Whole 07/17/2012   Influenza, High Dose Seasonal PF 06/06/2017, 06/06/2018, 05/16/2022   Influenza-Unspecified 06/20/2015, 06/14/2016, 05/16/2020, 05/12/2021   PFIZER(Purple Top)SARS-COV-2 Vaccination 09/04/2019, 09/28/2019, 05/27/2020, 05/12/2021   Pneumococcal Conjugate-13 11/05/2013   Pneumococcal Polysaccharide-23 09/26/2011   Zoster Recombinant(Shingrix) 05/09/2018, 11/01/2018    Past Medical History:  Diagnosis Date   Arthritis    knees   Diabetes mellitus (HCC)    GERD (gastroesophageal reflux disease)    Glaucoma    Hypercholesterolemia    Hypertension    Hypothyroidism    Pneumonia     Tobacco History: Social History   Tobacco Use  Smoking Status Never  Smokeless Tobacco Never   Counseling given: Not Answered   Outpatient Medications Prior to Visit  Medication Sig Dispense Refill   acetaminophen (TYLENOL) 650 MG CR tablet Take 650 mg by mouth daily as needed for pain.     amLODipine (NORVASC) 5 MG tablet Take 1 tablet (5 mg total) by mouth 2 (two) times daily. 180 tablet 3   aspirin EC 81 MG tablet Take 81 mg by mouth daily. Swallow whole.     blood glucose meter kit and supplies Dispense based on patient and insurance preference. Use up to four times daily as directed. (FOR ICD-10 E10.9, E11.9). 1 each 3  Cholecalciferol (VITAMIN D-3) 1000 UNITS CAPS Take 1 capsule by mouth daily.     cloNIDine (CATAPRES) 0.1 MG tablet TAKE 1 TABLET(0.1 MG) BY MOUTH THREE TIMES DAILY 270 tablet 3   empagliflozin (JARDIANCE) 25 MG TABS tablet TAKE 1 TABLET(25 MG) BY MOUTH DAILY BEFORE BREAKFAST 90 tablet 1   glucosamine-chondroitin 500-400 MG tablet Take 1 tablet by mouth daily.     glucose blood (CONTOUR NEXT TEST) test strip TEST TWICE DAILY AS DIRECTED 100  strip 1   hydrochlorothiazide (MICROZIDE) 12.5 MG capsule Take 1 capsule (12.5 mg total) by mouth daily. 90 capsule 3   Lancets (ONETOUCH ULTRASOFT) lancets Use as instructed to check blood sugars once daily. Dx E11.9 100 each 12   levothyroxine (SYNTHROID) 100 MCG tablet TAKE 1 TABLET(100 MCG) BY MOUTH DAILY 90 tablet 1   lisinopril (ZESTRIL) 20 MG tablet Take 1 tablet (20 mg total) by mouth daily. 90 tablet 1   ROCKLATAN 0.02-0.005 % SOLN Apply 1 drop to eye at bedtime.     rosuvastatin (CRESTOR) 20 MG tablet TAKE 1 TABLET(20 MG) BY MOUTH DAILY 90 tablet 3   timolol (TIMOPTIC) 0.25 % ophthalmic solution Place 1 drop into both eyes 2 (two) times daily.     Turmeric 500 MG CAPS Take 1 capsule by mouth daily.     vitamin C (ASCORBIC ACID) 500 MG tablet Take 500 mg by mouth daily.     vitamin E 200 UNIT capsule Take 200 Units by mouth daily.     No facility-administered medications prior to visit.     Review of Systems:   Constitutional: No weight loss or gain, night sweats, fevers, chills, or lassitude. +fatigue  HEENT: No difficulty swallowing, tooth/dental problems, or sore throat. No sneezing, itching, ear ache, nasal congestion, or post nasal drip +headaches  CV:  No chest pain, orthopnea, PND, swelling in lower extremities, anasarca, dizziness, palpitations, syncope Resp: No shortness of breath with exertion or at rest. No excess mucus or change in color of mucus. No productive or non-productive. No hemoptysis. No wheezing.  No chest wall deformity GI:  No heartburn, indigestion GU: No dysuria, change in color of urine, urgency or daytime frequency. +nocturia  Skin: No rash, lesions, ulcerations MSK:  No joint pain or swelling.  Neuro: No dizziness or lightheadedness.  Psych: No depression or anxiety. Mood stable. +sleep disturbance    Physical Exam:  BP (!) 146/76 (BP Location: Left Arm, Patient Position: Sitting, Cuff Size: Normal)   Pulse 65   Temp 97.7 F (36.5 C)  (Temporal)   Ht 5' (1.524 m)   Wt 193 lb 12.8 oz (87.9 kg)   LMP 08/11/1995   SpO2 97%   BMI 37.85 kg/m   GEN: Pleasant, interactive, well-appearing; elderly; obese; in no acute distress HEENT:  Normocephalic and atraumatic. PERRLA. Sclera white. Nasal turbinates pink, moist and patent bilaterally. No rhinorrhea present. Oropharynx pink and moist, without exudate or edema. No lesions, ulcerations, or postnasal drip. Mallampati II NECK:  Supple w/ fair ROM. No JVD present. Normal carotid impulses w/o bruits. Thyroid symmetrical with no goiter or nodules palpated. No lymphadenopathy.   CV: RRR, no m/r/g, no peripheral edema. Pulses intact, +2 bilaterally. No cyanosis, pallor or clubbing. PULMONARY:  Unlabored, regular breathing. Clear bilaterally A&P w/o wheezes/rales/rhonchi. No accessory muscle use.  GI: BS present and normoactive. Soft, non-tender to palpation. No organomegaly or masses detected.  MSK: No erythema, warmth or tenderness. Cap refil <2 sec all extrem. No deformities or joint swelling  noted.  Neuro: A/Ox3. No focal deficits noted.   Skin: Warm, no lesions or rashe Psych: Normal affect and behavior. Judgement and thought content appropriate.     Lab Results:  CBC    Component Value Date/Time   WBC 6.7 01/10/2023 1007   RBC 4.44 01/10/2023 1007   HGB 13.4 01/10/2023 1007   HGB 15.0 05/01/2020 0950   HCT 39.6 01/10/2023 1007   HCT 43.4 05/01/2020 0950   PLT 257.0 01/10/2023 1007   PLT 267 05/01/2020 0950   MCV 89.2 01/10/2023 1007   MCV 88 05/01/2020 0950   MCV 90 12/19/2011 0915   MCH 30.4 05/01/2020 0950   MCH 30.4 07/24/2018 1413   MCHC 33.8 01/10/2023 1007   RDW 13.2 01/10/2023 1007   RDW 12.1 05/01/2020 0950   RDW 12.0 12/19/2011 0915   LYMPHSABS 1.2 01/10/2023 1007   LYMPHSABS 1.7 05/01/2020 0950   MONOABS 0.5 01/10/2023 1007   EOSABS 0.3 01/10/2023 1007   EOSABS 0.2 05/01/2020 0950   BASOSABS 0.0 01/10/2023 1007   BASOSABS 0.1 05/01/2020 0950     BMET    Component Value Date/Time   NA 139 05/19/2023 1140   NA 141 05/03/2023 0000   NA 132 (L) 01/04/2012 0305   K 4.1 05/19/2023 1140   K 3.6 01/04/2012 0305   CL 100 05/19/2023 1140   CL 100 01/04/2012 0305   CO2 27 05/19/2023 1140   CO2 27 01/04/2012 0305   GLUCOSE 148 (H) 05/19/2023 1140   GLUCOSE 118 (H) 01/04/2012 0305   BUN 47 (H) 05/19/2023 1140   BUN 26 05/03/2023 0000   BUN 16 01/04/2012 0305   CREATININE 1.22 (H) 05/19/2023 1140   CREATININE 1.14 01/04/2012 0305   CALCIUM 10.4 (H) 05/19/2023 1140   CALCIUM 8.1 (L) 01/04/2012 0305   GFRNONAA 44 (L) 05/19/2023 1140   GFRNONAA 49 (L) 01/04/2012 0305   GFRAA 50 (L) 09/02/2020 1053   GFRAA 56 (L) 01/04/2012 0305    BNP No results found for: "BNP"   Imaging:  No results found.  Administration History     None           No data to display          No results found for: "NITRICOXIDE"      Assessment & Plan:   Mild obstructive sleep apnea Mild to moderate OSA with AHI 15/h. Reviewed risks of untreated OSA and potential treatment options. Shared decision to move forward with CPAP. Orders placed for auto CPAP 5-15 cmH2O, DreamWear nasal cradle mask, and heated humidity. Educated on proper use/care of device. Risks/benefits reviewed. Encouraged healthy weight loss measures.   Patient Instructions  Start CPAP 5-15 cmH2O, DreamWear nasal cradle mask, heated humidity, every night, minimum of 4-6 hours a night.  Change equipment as directed. Wash your tubing with warm soap and water daily, hang to dry. Wash humidifier portion weekly. Use bottled, distilled water and change daily Be aware of reduced alertness and do not drive or operate heavy machinery if experiencing this or drowsiness.  Exercise encouraged, as tolerated. Healthy weight management discussed.  Avoid or decrease alcohol consumption and medications that make you more sleepy, if possible. Notify if persistent daytime sleepiness occurs  even with consistent use of PAP therapy.  We discussed how untreated sleep apnea puts an individual at risk for cardiac arrhthymias, pulm HTN, DM, stroke and increases their risk for daytime accidents. We also briefly reviewed treatment options including weight loss, side sleeping position, oral  appliance, CPAP therapy or referral to ENT for possible surgical options  Change supplies... Every month Mask cushions and/or nasal pillows CPAP machine filters Every 3 months Mask frame (not including the headgear) CPAP tubing Every 6 months Mask headgear Chin strap (if applicable) Humidifier water tub  Follow up in 8-10 weeks with Katie Joeseph Verville,NP, or sooner, if needed    Class 2 obesity with body mass index (BMI) of 37.0 to 37.9 in adult BMI 37. Healthy weight loss encouraged.   Hypertension Refractory HTN possibly due to untreated OSA. See above. Follow up with cardiology as scheduled.   Advised if symptoms do not improve or worsen, to please contact office for sooner follow up or seek emergency care.   I spent 35 minutes of dedicated to the care of this patient on the date of this encounter to include pre-visit review of records, face-to-face time with the patient discussing conditions above, post visit ordering of testing, clinical documentation with the electronic health record, making appropriate referrals as documented, and communicating necessary findings to members of the patients care team.  Noemi Chapel, NP 08/21/2023  Pt aware and understands NP's role.

## 2023-08-21 NOTE — Assessment & Plan Note (Signed)
Refractory HTN possibly due to untreated OSA. See above. Follow up with cardiology as scheduled.

## 2023-08-21 NOTE — Assessment & Plan Note (Signed)
BMI 37. Healthy weight loss encouraged.  

## 2023-08-21 NOTE — Patient Instructions (Addendum)
Start CPAP 5-15 cmH2O, DreamWear nasal cradle mask, heated humidity, every night, minimum of 4-6 hours a night.  Change equipment as directed. Wash your tubing with warm soap and water daily, hang to dry. Wash humidifier portion weekly. Use bottled, distilled water and change daily Be aware of reduced alertness and do not drive or operate heavy machinery if experiencing this or drowsiness.  Exercise encouraged, as tolerated. Healthy weight management discussed.  Avoid or decrease alcohol consumption and medications that make you more sleepy, if possible. Notify if persistent daytime sleepiness occurs even with consistent use of PAP therapy.  We discussed how untreated sleep apnea puts an individual at risk for cardiac arrhthymias, pulm HTN, DM, stroke and increases their risk for daytime accidents. We also briefly reviewed treatment options including weight loss, side sleeping position, oral appliance, CPAP therapy or referral to ENT for possible surgical options  Change supplies... Every month Mask cushions and/or nasal pillows CPAP machine filters Every 3 months Mask frame (not including the headgear) CPAP tubing Every 6 months Mask headgear Chin strap (if applicable) Humidifier water tub  Follow up in 8-10 weeks with Lorraine Bernis Schreur,NP, or sooner, if needed

## 2023-08-21 NOTE — Assessment & Plan Note (Signed)
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 Reviewed risks of untreated OSA and potential treatment options. Shared decision to move forward with CPAP. Orders placed for auto CPAP 5-15 cmH2O, DreamWear nasal cradle mask, and heated humidity. Educated on proper use/care of device. Risks/benefits reviewed. Encouraged healthy weight loss measures.   Patient Instructions  Start CPAP 5-15 cmH2O, DreamWear nasal cradle mask, heated humidity, every night, minimum of 4-6 hours a night.  Change equipment as directed. Wash your tubing with warm soap and water daily, hang to dry. Wash humidifier portion weekly. Use bottled, distilled water and change daily Be aware of reduced alertness and do not drive or operate heavy machinery if experiencing this or drowsiness.  Exercise encouraged, as tolerated. Healthy weight management discussed.  Avoid or decrease alcohol consumption and medications that make you more sleepy, if possible. Notify if persistent daytime sleepiness occurs even with consistent use of PAP therapy.  We discussed how untreated sleep apnea puts an individual at risk for cardiac arrhthymias, pulm HTN, DM, stroke and increases their risk for daytime accidents. We also briefly reviewed treatment options including weight loss, side sleeping position, oral appliance, CPAP therapy or referral to ENT for possible surgical options  Change supplies... Every month Mask cushions and/or nasal pillows CPAP machine filters Every 3 months Mask frame (not including the headgear) CPAP tubing Every 6 months Mask headgear Chin strap (if applicable) Humidifier water tub  Follow up in 8-10 weeks with Katie Breely Panik,NP, or sooner, if needed

## 2023-08-30 NOTE — Assessment & Plan Note (Signed)
 Physical today 08/31/23.  Mammogram 06/19/23 - birads I.  colonosocpy 06/2013.

## 2023-08-30 NOTE — Progress Notes (Signed)
 Subjective:    Patient ID: Lorraine Foley, female    DOB: [birth year], 83 y.o.   MRN: [id number]  Patient here for  Chief Complaint  Patient presents with   Annual Exam    HPI Here for a physical exam. Saw Comer Rouleau - pulmonary - f/u OSA. Recommended auto cpap. Saw cardiology 06/08/23 - f/u regarding hypertension. Recommended referral to hypertension clinic if persistent elevation. 05/2023 - echocardiogram that was completed that revealed LVEF of 60 to 65%, no RWMA, G1 DD, moderately elevated pulmonary artery systolic pressures without concern for valvular abnormalities. Breathing stable. Discussed exercise. Walking in park, etc. No chest pain reported. No increased cough or congestion. Reviewed blood sugars - elevated.  A1c 8.8. reviewed outside readings with sugars averaging 160-220. Discussed diet and exercise. Discussed other treatment options. Agreeable for a trial of ozempic . Persistent fluctuations in her blood pressure. Remains elevated above goal. Have tried various treatment options as outlined previously. Discussed referral to hypertension clinic.    Past Medical History:  Diagnosis Date   Arthritis    knees   Diabetes mellitus (HCC)    GERD (gastroesophageal reflux disease)    Glaucoma    Hypercholesterolemia    Hypertension    Hypothyroidism    Pneumonia    Past Surgical History:  Procedure Laterality Date   CATARACT EXTRACTION W/PHACO Left 11/09/2016   Procedure: CATARACT EXTRACTION PHACO AND INTRAOCULAR LENS PLACEMENT (IOC)  left diabetic complicated;  Surgeon: Dene Etienne, MD;  Location: [facility];  Service: Ophthalmology;  Laterality: Left;  Diabetic - oral meds   CATARACT EXTRACTION W/PHACO Right 03/08/2017   Procedure: CATARACT EXTRACTION PHACO AND INTRAOCULAR LENS PLACEMENT (IOC)  Right Diabetic complicated;  Surgeon: Etienne Dene, MD;  Location: [facility];  Service: Ophthalmology;  Laterality: Right;   EYE SURGERY Bilateral  08/2014, 2.2016   laser surgery in preparation for glaucoma surgery   KNEE ARTHROPLASTY Left 08/06/2018   Procedure: COMPUTER ASSISTED TOTAL KNEE ARTHROPLASTY;  Surgeon: Mardee Lynwood SQUIBB, MD;  Location: [facility];  Service: Orthopedics;  Laterality: Left;   TOTAL HIP ARTHROPLASTY  5/13   right   Family History  Problem Relation Age of Onset   Glaucoma Mother    Osteoporosis Mother    Mental illness Father        suicide   Liver disease Father        alcohol   Glaucoma Father    Alcohol abuse Father    Blindness Father    Diabetes Sister    Stroke Sister    Breast cancer Sister 25   Colon cancer Neg Hx    Social History   Socioeconomic History   Marital status: Divorced    Spouse name: Not on file   Number of children: 1   Years of education: Not on file   Highest education level: 12th grade  Occupational History   Not on file  Tobacco Use   Smoking status: Never   Smokeless tobacco: Never  Vaping Use   Vaping status: Never Used  Substance and Sexual Activity   Alcohol use: No    Alcohol/week: 0.0 standard drinks of alcohol   Drug use: No   Sexual activity: Not on file  Other Topics Concern   Not on file  Social History Narrative   Not on file   Social Drivers of Health   Financial Resource Strain: Low Risk  (08/29/2023)   Overall Financial Resource Strain (CARDIA)    Difficulty of Paying Living  Expenses: Not very hard  Food Insecurity: No Food Insecurity (08/29/2023)   Hunger Vital Sign    Worried About Running Out of Food in the Last Year: Never true    Ran Out of Food in the Last Year: Never true  Transportation Needs: No Transportation Needs (08/29/2023)   PRAPARE - Administrator, Civil Service (Medical): No    Lack of Transportation (Non-Medical): No  Physical Activity: Unknown (08/29/2023)   Exercise Vital Sign    Days of Exercise per Week: 0 days    Minutes of Exercise per Session: Not on file  Stress: No Stress Concern Present  (08/29/2023)   [employer] of Occupational Health - Occupational Stress Questionnaire    Feeling of Stress : Not at all  Social Connections: Unknown (08/29/2023)   Social Connection and Isolation Panel [NHANES]    Frequency of Communication with Friends and Family: More than three times a week    Frequency of Social Gatherings with Friends and Family: Once a week    Attends Religious Services: More than 4 times per year    Active Member of [employer] or Organizations: Patient declined    Attends Engineer, Structural: Not on file    Marital Status: Divorced     Review of Systems  Constitutional:  Negative for appetite change and unexpected weight change.  HENT:  Negative for congestion and sinus pressure.   Respiratory:  Negative for cough and chest tightness.        No increased sob.  Breathing stable.   Cardiovascular:  Negative for chest pain, palpitations and leg swelling.  Gastrointestinal:  Negative for abdominal pain, diarrhea, nausea and vomiting.  Genitourinary:  Negative for difficulty urinating and dysuria.  Musculoskeletal:  Negative for joint swelling and myalgias.  Skin:  Negative for color change and rash.  Neurological:  Negative for dizziness and headaches.  Psychiatric/Behavioral:  Negative for agitation and dysphoric mood.        Objective:     BP 134/74   Pulse 68   Temp 98 F (36.7 C)   Resp 16   Ht 5' (1.524 m)   Wt 194 lb 6.4 oz (88.2 kg)   LMP 08/11/1995   SpO2 98%   BMI 37.97 kg/m  Wt Readings from Last 3 Encounters:  08/31/23 194 lb 6.4 oz (88.2 kg)  08/21/23 193 lb 12.8 oz (87.9 kg)  06/08/23 193 lb 3.2 oz (87.6 kg)    Physical Exam Vitals reviewed.  Constitutional:      General: She is not in acute distress.    Appearance: Normal appearance.  HENT:     Head: Normocephalic and atraumatic.     Right Ear: External ear normal.     Left Ear: External ear normal.  Eyes:     General: No scleral icterus.       Right eye: No  discharge.        Left eye: No discharge.     Conjunctiva/sclera: Conjunctivae normal.  Neck:     Thyroid : No thyromegaly.  Cardiovascular:     Rate and Rhythm: Normal rate and regular rhythm.  Pulmonary:     Effort: No respiratory distress.     Breath sounds: Normal breath sounds. No wheezing.  Abdominal:     General: Bowel sounds are normal.     Palpations: Abdomen is soft.     Tenderness: There is no abdominal tenderness.  Musculoskeletal:        General: No swelling or  tenderness.     Cervical back: Neck supple. No tenderness.  Lymphadenopathy:     Cervical: No cervical adenopathy.  Skin:    Findings: No erythema or rash.  Neurological:     Mental Status: She is alert.  Psychiatric:        Mood and Affect: Mood normal.        Behavior: Behavior normal.      Outpatient Encounter Medications as of 08/31/2023  Medication Sig   glucose blood (ACCU-CHEK GUIDE TEST) test strip Use as instructed to check blood sugar once daily   Lancets MISC Use as directed to check blood sugars once daily   Semaglutide ,0.25 or 0.5MG /DOS, 2 MG/3ML SOPN Inject 0.25 mg into the skin once a week.   acetaminophen  (TYLENOL ) 650 MG CR tablet Take 650 mg by mouth daily as needed for pain.   amLODipine  (NORVASC ) 5 MG tablet Take 1 tablet (5 mg total) by mouth 2 (two) times daily.   aspirin EC 81 MG tablet Take 81 mg by mouth daily. Swallow whole.   blood glucose meter kit and supplies Dispense based on patient and insurance preference. Use up to four times daily as directed. (FOR ICD-10 E10.9, E11.9).   Cholecalciferol  (VITAMIN D -3) 1000 UNITS CAPS Take 1 capsule by mouth daily.   cloNIDine  (CATAPRES ) 0.1 MG tablet TAKE 1 TABLET(0.1 MG) BY MOUTH THREE TIMES DAILY   empagliflozin  (JARDIANCE ) 25 MG TABS tablet TAKE 1 TABLET(25 MG) BY MOUTH DAILY BEFORE BREAKFAST   glucosamine-chondroitin 500-400 MG tablet Take 1 tablet by mouth daily.   glucose blood (CONTOUR NEXT TEST) test strip TEST TWICE DAILY AS  DIRECTED   hydrochlorothiazide  (MICROZIDE ) 12.5 MG capsule Take 1 capsule (12.5 mg total) by mouth daily.   Lancets (ONETOUCH ULTRASOFT) lancets Use as instructed to check blood sugars once daily. Dx E11.9   levothyroxine  (SYNTHROID ) 100 MCG tablet TAKE 1 TABLET(100 MCG) BY MOUTH DAILY   lisinopril  (ZESTRIL ) 20 MG tablet Take 1 tablet (20 mg total) by mouth daily.   ROCKLATAN 0.02-0.005 % SOLN Apply 1 drop to eye at bedtime.   rosuvastatin  (CRESTOR ) 20 MG tablet TAKE 1 TABLET(20 MG) BY MOUTH DAILY   timolol  (TIMOPTIC ) 0.25 % ophthalmic solution Place 1 drop into both eyes 2 (two) times daily.   Turmeric 500 MG CAPS Take 1 capsule by mouth daily.   vitamin C  (ASCORBIC ACID ) 500 MG tablet Take 500 mg by mouth daily.   vitamin E 200 UNIT capsule Take 200 Units by mouth daily.   No facility-administered encounter medications on file as of 08/31/2023.     Lab Results  Component Value Date   WBC 6.7 01/10/2023   HGB 13.4 01/10/2023   HCT 39.6 01/10/2023   PLT 257.0 01/10/2023   GLUCOSE 199 (H) 08/31/2023   CHOL 146 08/31/2023   TRIG 184.0 (H) 08/31/2023   HDL 51.00 08/31/2023   LDLDIRECT 80.0 04/28/2022   LDLCALC 58 08/31/2023   ALT 11 08/31/2023   AST 14 08/31/2023   NA 139 08/31/2023   K 4.5 08/31/2023   CL 98 08/31/2023   CREATININE 1.11 08/31/2023   BUN 30 (H) 08/31/2023   CO2 33 (H) 08/31/2023   TSH 0.87 01/10/2023   INR 1.00 07/24/2018   HGBA1C 8.8 (H) 08/31/2023   MICROALBUR 1.0 10/12/2022    MM 3D SCREENING MAMMOGRAM BILATERAL BREAST Result Date: 06/20/2023 CLINICAL DATA:  Screening. EXAM: DIGITAL SCREENING BILATERAL MAMMOGRAM WITH TOMOSYNTHESIS AND CAD TECHNIQUE: Bilateral screening digital craniocaudal and mediolateral oblique mammograms were  obtained. Bilateral screening digital breast tomosynthesis was performed. The images were evaluated with computer-aided detection. COMPARISON:  Previous exam(s). ACR Breast Density Category b: There are scattered areas of  fibroglandular density. FINDINGS: There are no findings suspicious for malignancy. IMPRESSION: No mammographic evidence of malignancy. A result letter of this screening mammogram will be mailed directly to the patient. RECOMMENDATION: Screening mammogram in one year. (Code:SM-B-01Y) BI-RADS CATEGORY  1: Negative. Electronically Signed   By: Rosaline Collet M.D.   On: 06/20/2023 17:53       Assessment & Plan:  Hypercholesterolemia Assessment & Plan: Continue pravastatin .  Low cholesterol diet and exercise.  Follow lipid panel and liver function tests.    Orders: -     Lipid panel -     Hepatic function panel -     Basic metabolic panel  Type 2 diabetes mellitus with hyperglycemia, without long-term current use of insulin  (HCC) -     Hemoglobin A1c  Healthcare maintenance Assessment & Plan: Physical today 08/31/23.  Mammogram 06/19/23 - birads I.  colonosocpy 06/2013.    Stress Assessment & Plan: Overall appears to be handling things relatively well. Follow.    Mild obstructive sleep apnea Assessment & Plan: Saw Katherine Cobb - pulmonary - f/u OSA. Recommended auto cpap.   Hypothyroidism, unspecified type Assessment & Plan: On thyroid  replacement.  Follow tsh.    Primary hypertension Assessment & Plan: Continues on amlodipine , clonidine  and lisinopril  20mg  q day.  Back on hydrochlorothiazide . Off spironolactone  with w/up planned as outlined -  renal artery doppler ordered by cardiology - at least moderate left renal artery stenosis.  Right kidney poorly visualized. Persistent issues with variation in blood pressure. Discussed referral to hypertension clinic. May need to consider MRA versus CTA for further assess.Continues on jardiance .  Follow pressures. Refer to hypertension clinic.  Orders: -     Ambulatory referral to Advanced Hypertension Clinic  Stage 3b chronic kidney disease (HCC) Assessment & Plan: Continue to avoid antiinflammatories.  Continue ace inhibitor.  Is  off spironolactone  currently.  Back on hydrochlorothiazide .  Renal artery doppler as outlined. Follow metabolic panel.  Continue f/u with nephrology.     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  Dispense: 3 mL; Refill: 2     Allena Hamilton, MD

## 2023-08-31 ENCOUNTER — Ambulatory Visit (INDEPENDENT_AMBULATORY_CARE_PROVIDER_SITE_OTHER): Payer: Medicare Other | Admitting: Internal Medicine

## 2023-08-31 VITALS — BP 134/74 | HR 68 | Temp 98.0°F | Resp 16 | Ht 60.0 in | Wt 194.4 lb

## 2023-08-31 DIAGNOSIS — F439 Reaction to severe stress, unspecified: Secondary | ICD-10-CM

## 2023-08-31 DIAGNOSIS — E1165 Type 2 diabetes mellitus with hyperglycemia: Secondary | ICD-10-CM

## 2023-08-31 DIAGNOSIS — G4733 Obstructive sleep apnea (adult) (pediatric): Secondary | ICD-10-CM | POA: Diagnosis not present

## 2023-08-31 DIAGNOSIS — Z0000 Encounter for general adult medical examination without abnormal findings: Secondary | ICD-10-CM

## 2023-08-31 DIAGNOSIS — E0829 Diabetes mellitus due to underlying condition with other diabetic kidney complication: Secondary | ICD-10-CM

## 2023-08-31 DIAGNOSIS — E7800 Pure hypercholesterolemia, unspecified: Secondary | ICD-10-CM

## 2023-08-31 DIAGNOSIS — I10 Essential (primary) hypertension: Secondary | ICD-10-CM

## 2023-08-31 DIAGNOSIS — N1832 Chronic kidney disease, stage 3b: Secondary | ICD-10-CM

## 2023-08-31 DIAGNOSIS — E039 Hypothyroidism, unspecified: Secondary | ICD-10-CM

## 2023-08-31 LAB — LIPID PANEL
Cholesterol: 146 mg/dL (ref 0–200)
HDL: 51 mg/dL (ref >39.00)
LDL Cholesterol: 58 mg/dL (ref 0–99)
NonHDL: 94.88
Total CHOL/HDL Ratio: 3
Triglycerides: 184 mg/dL — ABNORMAL HIGH (ref 0.0–149.0)
VLDL: 36.8 mg/dL (ref 0.0–40.0)

## 2023-08-31 LAB — HEPATIC FUNCTION PANEL
ALT: 11 U/L (ref 0–35)
AST: 14 U/L (ref 0–37)
Albumin: 4.4 g/dL (ref 3.5–5.2)
Alkaline Phosphatase: 55 U/L (ref 39–117)
Bilirubin, Direct: 0.1 mg/dL (ref 0.0–0.3)
Total Bilirubin: 0.6 mg/dL (ref 0.2–1.2)
Total Protein: 7.4 g/dL (ref 6.0–8.3)

## 2023-08-31 LAB — HEMOGLOBIN A1C: Hgb A1c MFr Bld: 8.8 % — ABNORMAL HIGH (ref 4.6–6.5)

## 2023-08-31 LAB — BASIC METABOLIC PANEL
BUN: 30 mg/dL — ABNORMAL HIGH (ref 6–23)
CO2: 33 meq/L — ABNORMAL HIGH (ref 19–32)
Calcium: 10.1 mg/dL (ref 8.4–10.5)
Chloride: 98 meq/L (ref 96–112)
Creatinine, Ser: 1.11 mg/dL (ref 0.40–1.20)
GFR: 46.25 mL/min — ABNORMAL LOW (ref >60.00)
Glucose, Bld: 199 mg/dL — ABNORMAL HIGH (ref 70–99)
Potassium: 4.5 meq/L (ref 3.5–5.1)
Sodium: 139 meq/L (ref 135–145)

## 2023-08-31 MED ORDER — ACCU-CHEK GUIDE TEST VI STRP: ORAL_STRIP | 12 refills | Status: AC

## 2023-08-31 MED ORDER — LANCETS MISC: 1 refills | Status: AC

## 2023-08-31 MED ORDER — SEMAGLUTIDE(0.25 OR 0.5MG/DOS) 2 MG/3ML ~~LOC~~ SOPN: 0.2500 mg | PEN_INJECTOR | SUBCUTANEOUS | 2 refills | Status: DC

## 2023-09-01 ENCOUNTER — Telehealth: Payer: Self-pay

## 2023-09-01 IMAGING — MG MM DIGITAL SCREENING BILAT W/ TOMO AND CAD
6 of 10 series · 6 of 30 positions shown · non-contrast
Comparison: Previous exam(s).

CLINICAL DATA: Screening.

EXAM:
DIGITAL SCREENING BILATERAL MAMMOGRAM WITH TOMOSYNTHESIS AND CAD
TECHNIQUE: Bilateral screening digital craniocaudal and mediolateral oblique
mammograms were obtained. Bilateral screening digital breast
tomosynthesis was performed. The images were evaluated with
computer-aided detection.

[R MLO synth-2D]
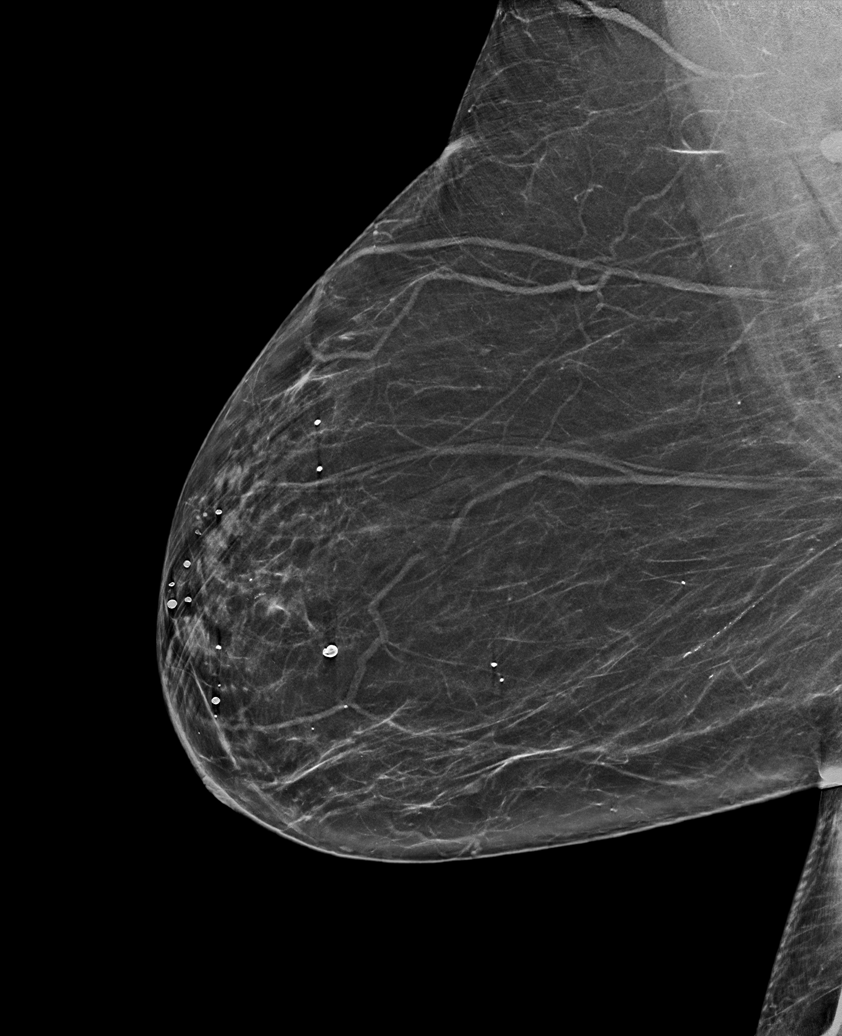

[L CC synth-2D]
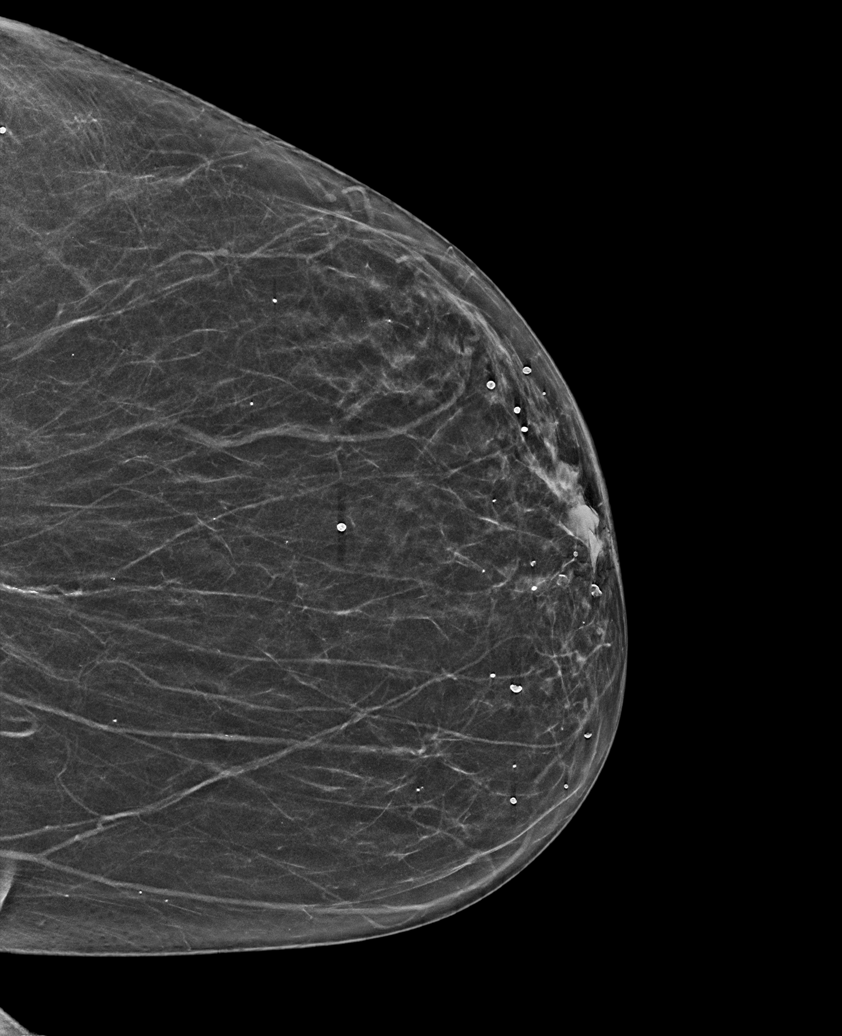

[R CC synth-2D]
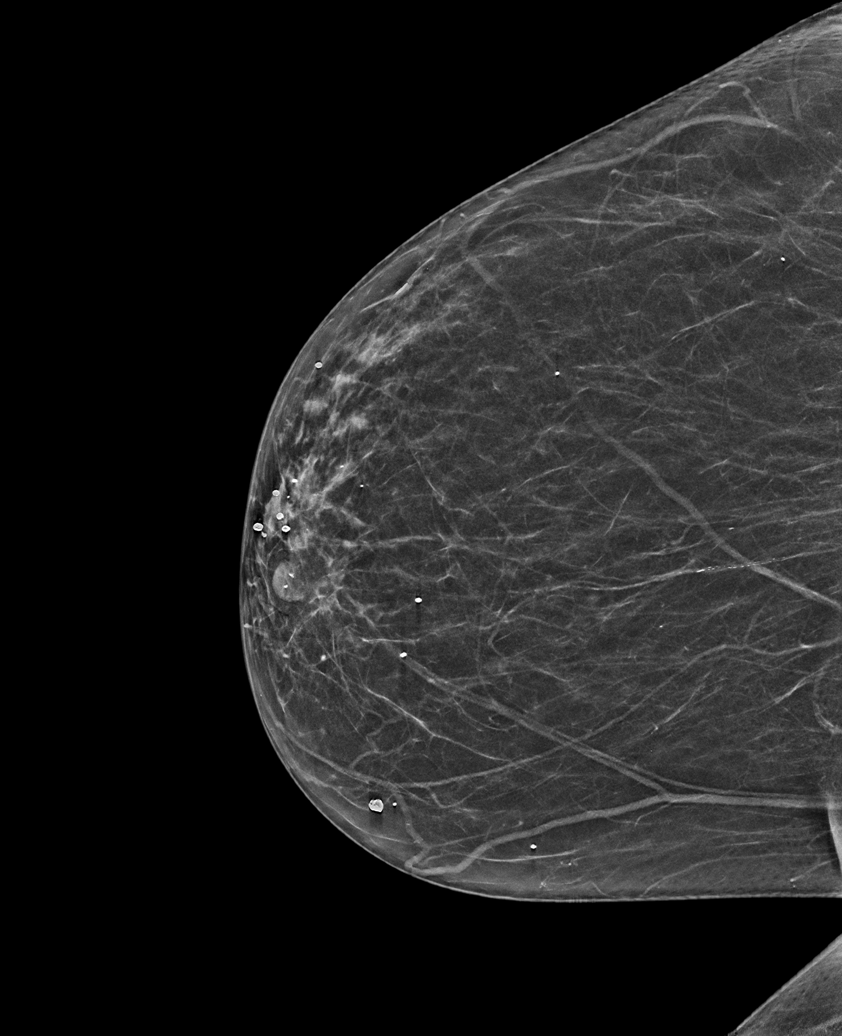

[L MLO synth-2D (1 of 2)]
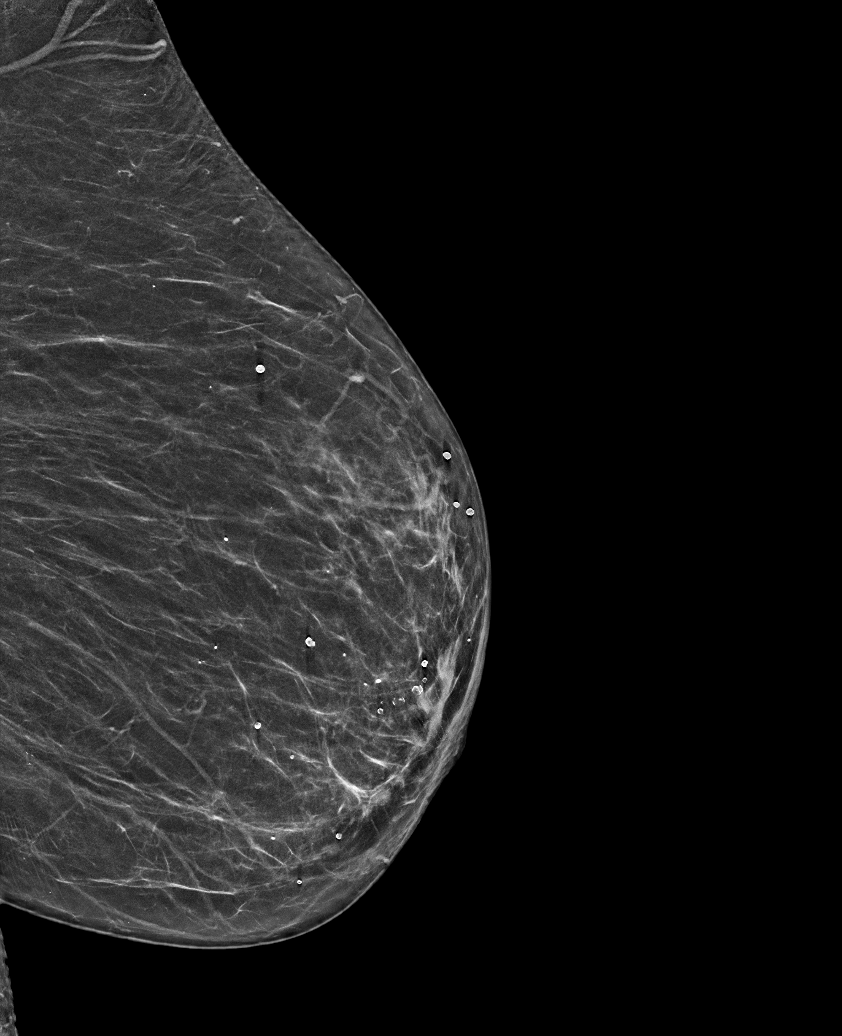

[L MLO synth-2D (2 of 2)]
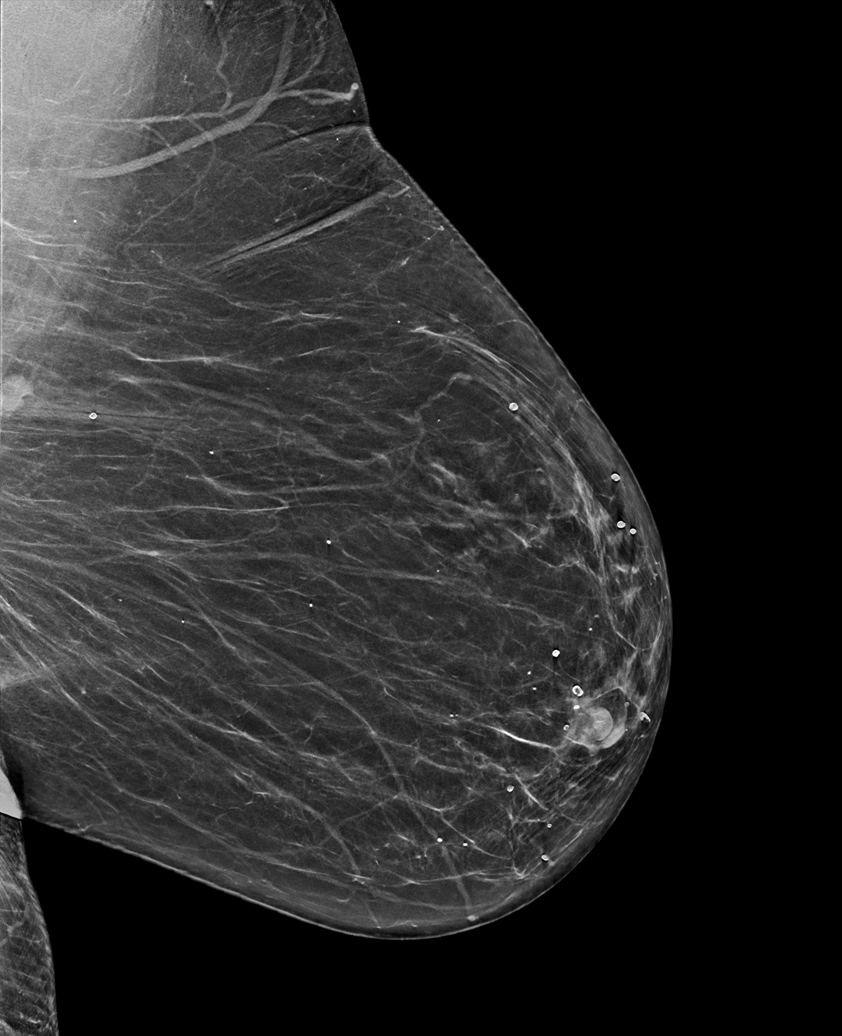

[R CC tomo · tomo slice 27/52.0]
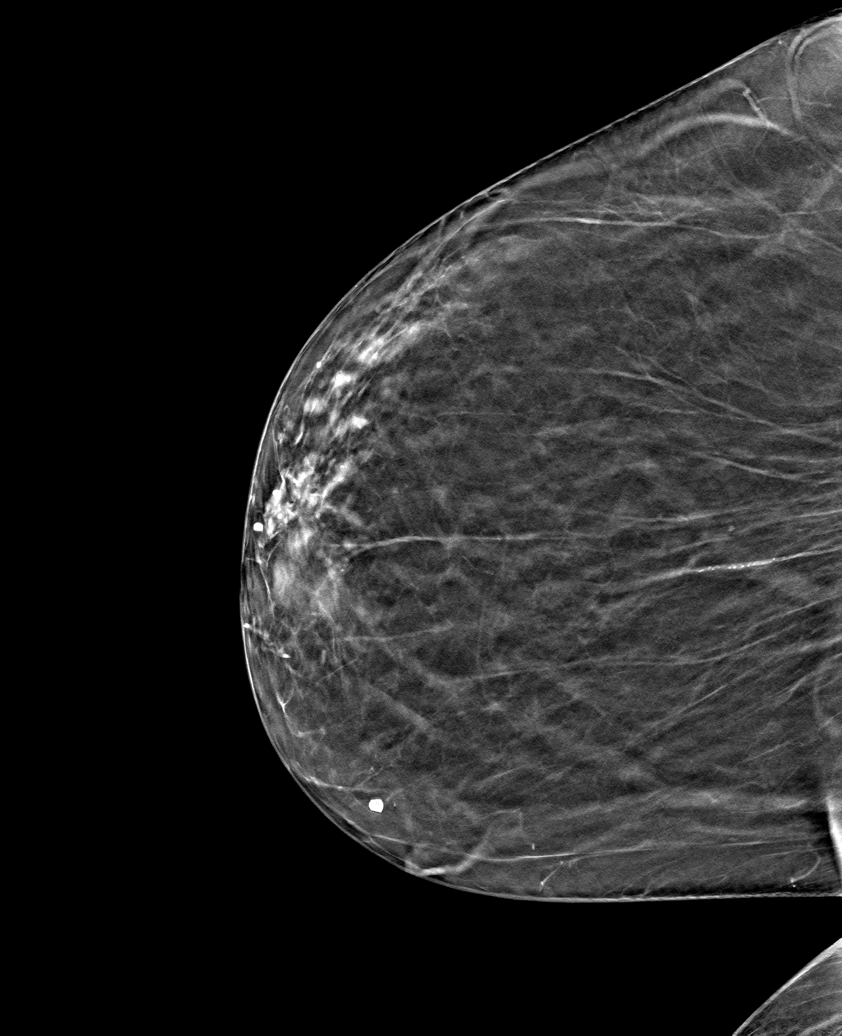

[6 of 30 positions shown; findings below may reference images not displayed]

ACR Breast Density Category b: There are scattered areas of
fibroglandular density.
FINDINGS: There are no findings suspicious for malignancy.
IMPRESSION: No mammographic evidence of malignancy. A result letter of this
screening mammogram will be mailed directly to the patient.

RECOMMENDATION:
Screening mammogram in one year. (Code:[id number])

BI-RADS CATEGORY  1: Negative.

## 2023-09-01 NOTE — Telephone Encounter (Signed)
See result note for documentation

## 2023-09-01 NOTE — Telephone Encounter (Signed)
 Copied from CRM [id number]. Topic: Clinical - Lab/Test Results >> Sep 01, 2023 10:52 AM Sonny Dandy B wrote: Reason for CRM: pt returned a call from DR. Scott's CMA requesting a call back for Lab results please give pt a call at [phone]

## 2023-09-03 ENCOUNTER — Encounter: Payer: Self-pay | Admitting: Internal Medicine

## 2023-09-03 DIAGNOSIS — E0829 Diabetes mellitus due to underlying condition with other diabetic kidney complication: Secondary | ICD-10-CM | POA: Insufficient documentation

## 2023-09-03 NOTE — Assessment & Plan Note (Signed)
 On thyroid replacement.  Follow tsh.

## 2023-09-03 NOTE — Assessment & Plan Note (Signed)
 Continues on amlodipine , clonidine  and lisinopril  20mg  q day.  Back on hydrochlorothiazide . Off spironolactone  with w/up planned as outlined -  renal artery doppler ordered by cardiology - at least moderate left renal artery stenosis.  Right kidney poorly visualized. Persistent issues with variation in blood pressure. Discussed referral to hypertension clinic. May need to consider MRA versus CTA for further assess.Continues on jardiance .  Follow pressures. Refer to hypertension clinic.

## 2023-09-03 NOTE — Assessment & Plan Note (Signed)
 Low carb diet and exercise.  Follow met b and a1c. On jardiance.  Walking more. Discussed treatment options. Discussed ozempic. Discussed possible side effects of ozempic. Discussed contraindication of GLP 1. Desires to start ozempic. Start .25mg . Follow.

## 2023-09-03 NOTE — Assessment & Plan Note (Signed)
 Continue pravastatin.  Low cholesterol diet and exercise.  Follow lipid panel and liver function tests.

## 2023-09-03 NOTE — Assessment & Plan Note (Signed)
Overall appears to be handling things relatively well.  Follow.   

## 2023-09-03 NOTE — Assessment & Plan Note (Signed)
 Saw Lorraine Foley - pulmonary - f/u OSA. Recommended auto cpap.

## 2023-09-03 NOTE — Assessment & Plan Note (Signed)
 Continue to avoid antiinflammatories.  Continue ace inhibitor.  Is off spironolactone currently.  Back on hydrochlorothiazide.  Renal artery doppler as outlined. Follow metabolic panel.  Continue f/u with nephrology.

## 2023-09-14 ENCOUNTER — Encounter: Payer: Self-pay | Admitting: Internal Medicine

## 2023-09-14 ENCOUNTER — Ambulatory Visit: Payer: Medicare Other | Attending: Internal Medicine | Admitting: Internal Medicine

## 2023-09-14 VITALS — BP 172/70 | HR 59 | Ht 60.0 in | Wt 193.0 lb

## 2023-09-14 DIAGNOSIS — I10 Essential (primary) hypertension: Secondary | ICD-10-CM | POA: Diagnosis not present

## 2023-09-14 DIAGNOSIS — Z79899 Other long term (current) drug therapy: Secondary | ICD-10-CM | POA: Diagnosis not present

## 2023-09-14 MED ORDER — HYDROCHLOROTHIAZIDE 25 MG PO TABS: 25.0000 mg | ORAL_TABLET | Freq: Every day | ORAL | 3 refills | Status: DC

## 2023-09-14 NOTE — Patient Instructions (Addendum)
Medication Instructions:  Your physician recommends the following medication changes.  INCREASE: Hydrocholoride to 25 mg by mouth daily     Lab Work: Your provider would like for you to return in 2-3 week to have the following labs drawn: (BMP, aldosterone-renin).   Please go to [facility] [address] [place], [place] [address] You do not need an appointment.  They are open from 8 am- 4:30 pm.  Lunch from 1:00 pm- 2:00 pm You will need to be fasting.    Testing/Procedures: No test ordered today    Follow-Up: At [facility], you and your health needs are our priority.  As part of our continuing mission to provide you with exceptional heart care, we have created designated Provider Care Teams.  These Care Teams include your primary Cardiologist (physician) and Advanced Practice Providers (APPs -  Physician Assistants and Nurse Practitioners) who all work together to provide you with the care you need, when you need it.  We recommend signing up for the patient portal called "MyChart".  Sign up information is provided on this After Visit Summary.  MyChart is used to connect with patients for Virtual Visits (Telemedicine).  Patients are able to view lab/test results, encounter notes, upcoming appointments, etc.  Non-urgent messages can be sent to your provider as well.   To learn more about what you can do with MyChart, go to [email or web address].    Your next appointment:   3 month(s)  Provider:   You may see Yvonne Kendall, MD or one of the following Advanced Practice Providers on your designated Care Team:   Nicolasa Ducking, NP Eula Listen, PA-C Cadence Fransico Michael, PA-C Charlsie Quest, NP Carlos Levering, NP

## 2023-09-14 NOTE — Progress Notes (Signed)
Cardiology Office Note:  .   Date:  09/15/2023  ID:  Lorraine Foley, DOB 1941/01/25, MRN [id number] PCP: Dale Norwalk, MD  [facility] Providers Cardiologist:  Yvonne Kendall, MD Electrophysiologist:  Lanier Prude, MD     History of Present Illness: .   Discussed the use of AI scribe software for clinical note transcription with the patient, who gave verbal consent to proceed.  Lorraine Foley is a 83 y.o. female with history of hypertension and type 2 diabetes mellitus, who presents for follow-up of hypertension and bradycardia.  She was last seen in our office in 05/2023 by Charlsie Quest, NP, at which time she was feeling well with improved blood pressure control.  She was awaiting the results of a sleep study ordered through pulmonology.  Blood pressure was mildly elevated at that visit.  Serum aldosterone and plasma renin activity were previously ordered but never drawn.  Renal artery Doppler 05/2023 showed greater than 60% stenosis of the left renal artery.  Right renal artery was not well-visualized.  Ms. Coleson reports that she has been adhering to a low sodium diet and taking prescribed medication, which has resulted in an improvement in her blood pressure. However, she reports that her blood pressure readings have been fluctuating, with some readings still higher than desired. She denies experiencing any lightheadedness, dizziness, chest pain, or shortness of breath, except when engaging in strenuous activities such as walking long distances.  Ms. Roed also has diabetes and has been self-administering Ozempic once a week, with no reported adverse reactions. She recently underwent a sleep study for suspected sleep apnea but has decided against using a CPAP machine due to discomfort and financial considerations. She has been trying to maintain an active lifestyle, with walking as her primary form of exercise. However, she has been limited by unsafe walking conditions in her  neighborhood and inclement weather. She plans to resume her walking routine at a local park once the weather improves.     ROS: See HPI  Studies Reviewed: .       Renal artery Doppler (06/06/2023): Normal left renal size.  Greater than 60% stenosis of the left renal artery.  Right renal artery not well-visualized.  TTE (06/06/2023): Normal LV size and wall thickness.  LVEF 60-65% with normal wall motion and grade 1 diastolic dysfunction.  Normal RV size and function.  Moderate pulmonary hypertension (RVSP 49 mmHg).  Normal biatrial size.  No pericardial effusion.  No significant valvular abnormalities.  Risk Assessment/Calculations:     HYPERTENSION CONTROL Vitals:   09/14/23 0842 09/14/23 0859  BP: (!) 153/74 (!) 172/70    The patient's blood pressure is elevated above target today.  In order to address the patient's elevated BP: A current anti-hypertensive medication was adjusted today.          Physical Exam:   VS:  BP (!) 172/70   Pulse (!) 59   Ht 5' (1.524 m)   Wt 193 lb (87.5 kg)   LMP 08/11/1995   SpO2 97%   BMI 37.69 kg/m    Wt Readings from Last 3 Encounters:  09/14/23 193 lb (87.5 kg)  08/31/23 194 lb 6.4 oz (88.2 kg)  08/21/23 193 lb 12.8 oz (87.9 kg)    General:  NAD. Neck: No JVD or HJR. Lungs: Clear to auscultation bilaterally without wheezes or crackles. Heart: Regular rate and rhythm without murmurs, rubs, or gallops. Abdomen: Soft, nontender, nondistended. Extremities: No lower extremity edema.  ASSESSMENT AND  PLAN: .    Resistant hypertension: Blood pressure has improved but remains elevated today and at home.  I have encouraged her to continue with her low-sodium diet.  We will increase HCTZ to 25 mg daily and continue her other medications consisting of amlodipine 5 mg twice daily, clonidine 0.1 mg 3 times daily, and lisinopril 20 mg daily.  If blood pressure remains elevated at follow-up, we agreed to pursue MRA to better evaluate her renal artery  stenosis.  I will have her return in 2 to 3 weeks for BMP as well as serum aldosterone and plasma renin activity (previously ordered but never collected).  Asymptomatic bradycardia: Heart rate borderline low today.  Given lack of symptoms, no further intervention recommended.  Rate lowering agents will continue to be avoided if possible.  Hyperlipidemia associated with type 2 diabetes mellitus: Continue rosuvastatin 20 mg daily with ongoing management of hyperlipidemia and diabetes mellitus per Dr. Lorin Picket.    Dispo: Return to clinic in 3 months.  Signed, Yvonne Kendall, MD

## 2023-09-15 ENCOUNTER — Encounter: Payer: Self-pay | Admitting: Internal Medicine

## 2023-10-03 ENCOUNTER — Other Ambulatory Visit
Admission: RE | Admit: 2023-10-03 | Discharge: 2023-10-03 | Disposition: A | Discharge status: Home / Self Care | Payer: Medicare Other | Source: Ambulatory Visit | Attending: Internal Medicine | Admitting: Internal Medicine

## 2023-10-03 ENCOUNTER — Encounter (HOSPITAL_BASED_OUTPATIENT_CLINIC_OR_DEPARTMENT_OTHER): Payer: Self-pay

## 2023-10-03 ENCOUNTER — Encounter: Payer: Self-pay | Admitting: Internal Medicine

## 2023-10-03 DIAGNOSIS — Z79899 Other long term (current) drug therapy: Secondary | ICD-10-CM | POA: Insufficient documentation

## 2023-10-03 LAB — BASIC METABOLIC PANEL
Anion gap: 15 (ref 5–15)
BUN: 38 mg/dL — ABNORMAL HIGH (ref 8–23)
CO2: 31 mmol/L (ref 22–32)
Calcium: 10.7 mg/dL — ABNORMAL HIGH (ref 8.9–10.3)
Chloride: 95 mmol/L — ABNORMAL LOW (ref 98–111)
Creatinine, Ser: 1.24 mg/dL — ABNORMAL HIGH (ref 0.44–1.00)
GFR, Estimated: 43 mL/min — ABNORMAL LOW (ref >60)
Glucose, Bld: 155 mg/dL — ABNORMAL HIGH (ref 70–99)
Potassium: 4 mmol/L (ref 3.5–5.1)
Sodium: 141 mmol/L (ref 135–145)

## 2023-10-06 ENCOUNTER — Other Ambulatory Visit: Payer: Self-pay | Admitting: Internal Medicine

## 2023-10-11 DIAGNOSIS — E039 Hypothyroidism, unspecified: Secondary | ICD-10-CM | POA: Diagnosis not present

## 2023-10-11 DIAGNOSIS — N1832 Chronic kidney disease, stage 3b: Secondary | ICD-10-CM | POA: Diagnosis not present

## 2023-10-11 DIAGNOSIS — E1122 Type 2 diabetes mellitus with diabetic chronic kidney disease: Secondary | ICD-10-CM | POA: Diagnosis not present

## 2023-10-11 DIAGNOSIS — I129 Hypertensive chronic kidney disease with stage 1 through stage 4 chronic kidney disease, or unspecified chronic kidney disease: Secondary | ICD-10-CM | POA: Diagnosis not present

## 2023-10-12 LAB — LAB REPORT - SCANNED
Albumin, Urine POC: 12.7
Creatinine, POC: 19.8 mg/dL
Microalb Creat Ratio: 64
PTH: 11

## 2023-10-13 DIAGNOSIS — H401132 Primary open-angle glaucoma, bilateral, moderate stage: Secondary | ICD-10-CM | POA: Diagnosis not present

## 2023-10-14 LAB — ALDOSTERONE + RENIN ACTIVITY W/ RATIO
ALDO / PRA Ratio: 6.2 (ref 0.0–30.0)
Aldosterone: 9.6 ng/dL (ref 0.0–30.0)
PRA LC/MS/MS: 1.542 ng/mL/h (ref 0.167–5.380)

## 2023-10-17 ENCOUNTER — Ambulatory Visit: Payer: Medicare Other | Admitting: Nurse Practitioner

## 2023-10-26 ENCOUNTER — Ambulatory Visit (INDEPENDENT_AMBULATORY_CARE_PROVIDER_SITE_OTHER): Payer: Medicare Other | Admitting: Internal Medicine

## 2023-10-26 ENCOUNTER — Encounter: Payer: Self-pay | Admitting: Internal Medicine

## 2023-10-26 VITALS — BP 138/72 | HR 69 | Temp 98.0°F | Resp 16 | Ht 60.0 in | Wt 193.6 lb

## 2023-10-26 DIAGNOSIS — E039 Hypothyroidism, unspecified: Secondary | ICD-10-CM

## 2023-10-26 DIAGNOSIS — Z7985 Long-term (current) use of injectable non-insulin antidiabetic drugs: Secondary | ICD-10-CM

## 2023-10-26 DIAGNOSIS — N1832 Chronic kidney disease, stage 3b: Secondary | ICD-10-CM

## 2023-10-26 DIAGNOSIS — I10 Essential (primary) hypertension: Secondary | ICD-10-CM

## 2023-10-26 DIAGNOSIS — E7800 Pure hypercholesterolemia, unspecified: Secondary | ICD-10-CM | POA: Diagnosis not present

## 2023-10-26 DIAGNOSIS — Z7984 Long term (current) use of oral hypoglycemic drugs: Secondary | ICD-10-CM

## 2023-10-26 DIAGNOSIS — F439 Reaction to severe stress, unspecified: Secondary | ICD-10-CM

## 2023-10-26 DIAGNOSIS — E1165 Type 2 diabetes mellitus with hyperglycemia: Secondary | ICD-10-CM | POA: Diagnosis not present

## 2023-10-26 LAB — HM DIABETES FOOT EXAM

## 2023-10-26 NOTE — Progress Notes (Signed)
 Subjective:    Patient ID: Lorraine Foley, female    DOB: 1941/05/14, 83 y.o.   MRN: [id number]  Patient here for  Chief Complaint  Patient presents with   Medical Management of Chronic Issues    HPI Here for a scheduled follow up - follow up regarding hypercholesterolemia, diabetes and hypertension.  is followed by cardiology. 05/2023 - echocardiogram that was completed that revealed LVEF of 60 to 65%, no RWMA, G1 DD, moderately elevated pulmonary artery systolic pressures without concern for valvular abnormalities. Has been having problems with elevated blood pressure. Renal artery Doppler 05/2023 showed greater than 60% stenosis of the left renal artery. Right renal artery was not well-visualized. Saw Dr End 09/14/23 - hydrochlorothiazide increased to 25mg  q day. Last A1c elevated 8.8. started ozempic. Tolerating ozempic. Discussed increasing dose to .5mg  weekly. Blood pressures overall doing better. Reviewed outside readings. Most blood pressures averaging - 120-140s/60-70. Overall feeling better.    Past Medical History:  Diagnosis Date   Arthritis    knees   Diabetes mellitus (HCC)    GERD (gastroesophageal reflux disease)    Glaucoma    Hypercholesterolemia    Hypertension    Hypothyroidism    Pneumonia    Past Surgical History:  Procedure Laterality Date   CATARACT EXTRACTION W/PHACO Left 11/09/2016   Procedure: CATARACT EXTRACTION PHACO AND INTRAOCULAR LENS PLACEMENT (IOC)  left diabetic complicated;  Surgeon: Lockie Mola, MD;  Location: [facility];  Service: Ophthalmology;  Laterality: Left;  Diabetic - oral meds   CATARACT EXTRACTION W/PHACO Right 03/08/2017   Procedure: CATARACT EXTRACTION PHACO AND INTRAOCULAR LENS PLACEMENT (IOC)  Right Diabetic complicated;  Surgeon: Lockie Mola, MD;  Location: [facility];  Service: Ophthalmology;  Laterality: Right;   EYE SURGERY Bilateral 08/2014, 2.2016   laser surgery in preparation for glaucoma  surgery   KNEE ARTHROPLASTY Left 08/06/2018   Procedure: COMPUTER ASSISTED TOTAL KNEE ARTHROPLASTY;  Surgeon: Donato Heinz, MD;  Location: [facility];  Service: Orthopedics;  Laterality: Left;   TOTAL HIP ARTHROPLASTY  5/13   right   Family History  Problem Relation Age of Onset   Glaucoma Mother    Osteoporosis Mother    Mental illness Father        suicide   Liver disease Father        alcohol   Glaucoma Father    Alcohol abuse Father    Blindness Father    Diabetes Sister    Stroke Sister    Breast cancer Sister 31   Colon cancer Neg Hx    Social History   Socioeconomic History   Marital status: Divorced    Spouse name: Not on file   Number of children: 1   Years of education: Not on file   Highest education level: 12th grade  Occupational History   Not on file  Tobacco Use   Smoking status: Never   Smokeless tobacco: Never  Vaping Use   Vaping status: Never Used  Substance and Sexual Activity   Alcohol use: No    Alcohol/week: 0.0 standard drinks of alcohol   Drug use: No   Sexual activity: Not on file  Other Topics Concern   Not on file  Social History Narrative   Not on file   Social Drivers of Health   Financial Resource Strain: Low Risk  (08/29/2023)   Overall Financial Resource Strain (CARDIA)    Difficulty of Paying Living Expenses: Not very hard  Food Insecurity: No  Food Insecurity (08/29/2023)   Hunger Vital Sign    Worried About Running Out of Food in the Last Year: Never true    Ran Out of Food in the Last Year: Never true  Transportation Needs: No Transportation Needs (08/29/2023)   PRAPARE - Administrator, Civil Service (Medical): No    Lack of Transportation (Non-Medical): No  Physical Activity: Unknown (08/29/2023)   Exercise Vital Sign    Days of Exercise per Week: 0 days    Minutes of Exercise per Session: Not on file  Stress: No Stress Concern Present (08/29/2023)   [employer] of Occupational Health -  Occupational Stress Questionnaire    Feeling of Stress : Not at all  Social Connections: Unknown (08/29/2023)   Social Connection and Isolation Panel [NHANES]    Frequency of Communication with Friends and Family: More than three times a week    Frequency of Social Gatherings with Friends and Family: Once a week    Attends Religious Services: More than 4 times per year    Active Member of [employer] or Organizations: Patient declined    Attends Engineer, structural: Not on file    Marital Status: Divorced     Review of Systems  Constitutional:  Negative for appetite change and unexpected weight change.  HENT:  Negative for congestion and sinus pressure.   Respiratory:  Negative for cough, chest tightness and shortness of breath.   Cardiovascular:  Negative for chest pain, palpitations and leg swelling.  Gastrointestinal:  Negative for abdominal pain, diarrhea, nausea and vomiting.  Genitourinary:  Negative for difficulty urinating and dysuria.  Musculoskeletal:  Negative for joint swelling and myalgias.  Skin:  Negative for color change and rash.  Neurological:  Negative for dizziness and headaches.  Psychiatric/Behavioral:  Negative for agitation and dysphoric mood.        Objective:     BP 138/72   Pulse 69   Temp 98 F (36.7 C)   Resp 16   Ht 5' (1.524 m)   Wt 193 lb 9.6 oz (87.8 kg)   LMP 08/11/1995   SpO2 97%   BMI 37.81 kg/m  Wt Readings from Last 3 Encounters:  10/26/23 193 lb 9.6 oz (87.8 kg)  09/14/23 193 lb (87.5 kg)  08/31/23 194 lb 6.4 oz (88.2 kg)    Physical Exam Vitals reviewed.  Constitutional:      General: She is not in acute distress.    Appearance: Normal appearance.  HENT:     Head: Normocephalic and atraumatic.     Right Ear: External ear normal.     Left Ear: External ear normal.     Mouth/Throat:     Pharynx: No oropharyngeal exudate or posterior oropharyngeal erythema.  Eyes:     General: No scleral icterus.       Right eye: No  discharge.        Left eye: No discharge.     Conjunctiva/sclera: Conjunctivae normal.  Neck:     Thyroid: No thyromegaly.  Cardiovascular:     Rate and Rhythm: Normal rate and regular rhythm.  Pulmonary:     Effort: No respiratory distress.     Breath sounds: Normal breath sounds. No wheezing.  Abdominal:     General: Bowel sounds are normal.     Palpations: Abdomen is soft.     Tenderness: There is no abdominal tenderness.  Musculoskeletal:        General: No swelling or tenderness.  Cervical back: Neck supple. No tenderness.  Lymphadenopathy:     Cervical: No cervical adenopathy.  Skin:    Findings: No erythema or rash.  Neurological:     Mental Status: She is alert.  Psychiatric:        Mood and Affect: Mood normal.        Behavior: Behavior normal.         Outpatient Encounter Medications as of 10/26/2023  Medication Sig   Semaglutide,0.25 or 0.5MG /DOS, (OZEMPIC, 0.25 OR 0.5 MG/DOSE,) 2 MG/3ML SOPN Inject 0.5 mg into the skin once a week.   acetaminophen (TYLENOL) 650 MG CR tablet Take 650 mg by mouth daily as needed for pain.   amLODipine (NORVASC) 5 MG tablet Take 1 tablet (5 mg total) by mouth 2 (two) times daily.   aspirin EC 81 MG tablet Take 81 mg by mouth daily. Swallow whole.   blood glucose meter kit and supplies Dispense based on patient and insurance preference. Use up to four times daily as directed. (FOR ICD-10 E10.9, E11.9).   Cholecalciferol (VITAMIN D-3) 1000 UNITS CAPS Take 1 capsule by mouth daily.   cloNIDine (CATAPRES) 0.1 MG tablet TAKE 1 TABLET(0.1 MG) BY MOUTH THREE TIMES DAILY   glucosamine-chondroitin 500-400 MG tablet Take 1 tablet by mouth daily.   glucose blood (ACCU-CHEK GUIDE TEST) test strip Use as instructed to check blood sugar once daily   glucose blood (CONTOUR NEXT TEST) test strip TEST TWICE DAILY AS DIRECTED   hydrochlorothiazide (HYDRODIURIL) 25 MG tablet Take 1 tablet (25 mg total) by mouth daily.   JARDIANCE 25 MG TABS  tablet TAKE 1 TABLET(25 MG) BY MOUTH DAILY BEFORE BREAKFAST   Lancets (ONETOUCH ULTRASOFT) lancets Use as instructed to check blood sugars once daily. Dx E11.9   Lancets MISC Use as directed to check blood sugars once daily   levothyroxine (SYNTHROID) 100 MCG tablet TAKE 1 TABLET(100 MCG) BY MOUTH DAILY   lisinopril (ZESTRIL) 20 MG tablet Take 1 tablet (20 mg total) by mouth daily.   ROCKLATAN 0.02-0.005 % SOLN Apply 1 drop to eye at bedtime.   rosuvastatin (CRESTOR) 20 MG tablet TAKE 1 TABLET(20 MG) BY MOUTH DAILY   timolol (TIMOPTIC) 0.25 % ophthalmic solution Place 1 drop into both eyes 2 (two) times daily.   Turmeric 500 MG CAPS Take 1 capsule by mouth daily.   vitamin C (ASCORBIC ACID) 500 MG tablet Take 500 mg by mouth daily.   vitamin E 200 UNIT capsule Take 200 Units by mouth daily.   [DISCONTINUED] Semaglutide,0.25 or 0.5MG /DOS, 2 MG/3ML SOPN Inject 0.25 mg into the skin once a week.   No facility-administered encounter medications on file as of 10/26/2023.     Lab Results  Component Value Date   WBC 6.7 01/10/2023   HGB 13.4 01/10/2023   HCT 39.6 01/10/2023   PLT 257.0 01/10/2023   GLUCOSE 155 (H) 10/03/2023   CHOL 146 08/31/2023   TRIG 184.0 (H) 08/31/2023   HDL 51.00 08/31/2023   LDLDIRECT 80.0 04/28/2022   LDLCALC 58 08/31/2023   ALT 11 08/31/2023   AST 14 08/31/2023   NA 141 10/03/2023   K 4.0 10/03/2023   CL 95 (L) 10/03/2023   CREATININE 1.24 (H) 10/03/2023   BUN 38 (H) 10/03/2023   CO2 31 10/03/2023   TSH 0.87 01/10/2023   INR 1.00 07/24/2018   HGBA1C 8.8 (H) 08/31/2023   MICROALBUR 1.0 10/12/2022       Assessment & Plan:  Stage 3b chronic kidney  disease [facility]) Assessment & Plan: Continue to avoid antiinflammatories.  Continue ace inhibitor.  On hydrochlorothiazide.  Recently increased to one whole tablet per day by cardiology.  Continue f/u with nephrology. Follow metabolic panel.     Hypercholesterolemia Assessment & Plan: Continue pravastatin. Low  cholesterol diet and exercise. Follow lipid panel and liver function tests.   Orders: -     Basic metabolic panel; Future -     Hepatic function panel; Future -     Lipid panel; Future  Type 2 diabetes mellitus with hyperglycemia, without long-term current use of insulin (HCC) Assessment & Plan: Continues on jardiance. On ozempic. Tolerating. Sugars remain elevated. Discussed increasing ozempic to .5mg  q day. Continue diet and exercise. Follow met b and A1c.   Orders: -     Hemoglobin A1c; Future  Primary hypertension Assessment & Plan: Continues on amlodipine, clonidine and lisinopril 20mg  q day.  Back on hydrochlorothiazide and now taking one whole tablet per day.  Renal artery doppler ordered by cardiology - at least moderate left renal artery stenosis.  Right kidney poorly visualized. Persistent issues with variation in blood pressure. Have discussed referral to hypertension clinic. Continues on jardiance.  Follow pressures.  Has been referred to hypertension clinic. Blood pressure overall doing better. Hold on changing medication. Follow pressures.    Hypothyroidism, unspecified type Assessment & Plan: On thyroid replacement. Follow tsh.    Stress Assessment & Plan: Overall appears to be handling things well. Follow.    Other orders -     Ozempic (0.25 or 0.5 MG/DOSE); Inject 0.5 mg into the skin once a week.  Dispense: 3 mL; Refill: 2     Dale Smithton, MD

## 2023-10-27 DIAGNOSIS — Z96641 Presence of right artificial hip joint: Secondary | ICD-10-CM | POA: Diagnosis not present

## 2023-10-27 DIAGNOSIS — Z96652 Presence of left artificial knee joint: Secondary | ICD-10-CM | POA: Diagnosis not present

## 2023-10-29 ENCOUNTER — Encounter: Payer: Self-pay | Admitting: Internal Medicine

## 2023-10-29 MED ORDER — OZEMPIC (0.25 OR 0.5 MG/DOSE) 2 MG/3ML ~~LOC~~ SOPN: 0.5000 mg | PEN_INJECTOR | SUBCUTANEOUS | 2 refills | Status: DC

## 2023-10-29 NOTE — Assessment & Plan Note (Signed)
 On thyroid replacement.  Follow tsh.

## 2023-10-29 NOTE — Assessment & Plan Note (Signed)
 Continues on jardiance. On ozempic. Tolerating. Sugars remain elevated. Discussed increasing ozempic to .5mg  q day. Continue diet and exercise. Follow met b and A1c.

## 2023-10-29 NOTE — Assessment & Plan Note (Signed)
 Overall appears to be handling things well.  Follow.  ?

## 2023-10-29 NOTE — Assessment & Plan Note (Signed)
 Continues on amlodipine, clonidine and lisinopril 20mg  q day.  Back on hydrochlorothiazide and now taking one whole tablet per day.  Renal artery doppler ordered by cardiology - at least moderate left renal artery stenosis.  Right kidney poorly visualized. Persistent issues with variation in blood pressure. Have discussed referral to hypertension clinic. Continues on jardiance.  Follow pressures.  Has been referred to hypertension clinic. Blood pressure overall doing better. Hold on changing medication. Follow pressures.

## 2023-10-29 NOTE — Assessment & Plan Note (Signed)
 Continue pravastatin.  Low cholesterol diet and exercise.  Follow lipid panel and liver function tests.

## 2023-10-29 NOTE — Assessment & Plan Note (Signed)
 Continue to avoid antiinflammatories.  Continue ace inhibitor.  On hydrochlorothiazide.  Recently increased to one whole tablet per day by cardiology.  Continue f/u with nephrology. Follow metabolic panel.

## 2023-11-10 DIAGNOSIS — H401132 Primary open-angle glaucoma, bilateral, moderate stage: Secondary | ICD-10-CM | POA: Diagnosis not present

## 2023-11-20 DIAGNOSIS — R809 Proteinuria, unspecified: Secondary | ICD-10-CM | POA: Diagnosis not present

## 2023-11-20 DIAGNOSIS — I10 Essential (primary) hypertension: Secondary | ICD-10-CM | POA: Diagnosis not present

## 2023-11-20 DIAGNOSIS — E1122 Type 2 diabetes mellitus with diabetic chronic kidney disease: Secondary | ICD-10-CM | POA: Diagnosis not present

## 2023-11-20 DIAGNOSIS — N189 Chronic kidney disease, unspecified: Secondary | ICD-10-CM | POA: Diagnosis not present

## 2023-11-20 DIAGNOSIS — N1832 Chronic kidney disease, stage 3b: Secondary | ICD-10-CM | POA: Diagnosis not present

## 2023-12-08 DIAGNOSIS — H401112 Primary open-angle glaucoma, right eye, moderate stage: Secondary | ICD-10-CM | POA: Diagnosis not present

## 2023-12-08 DIAGNOSIS — H401121 Primary open-angle glaucoma, left eye, mild stage: Secondary | ICD-10-CM | POA: Diagnosis not present

## 2023-12-13 ENCOUNTER — Encounter: Payer: Self-pay | Admitting: Internal Medicine

## 2023-12-13 ENCOUNTER — Ambulatory Visit: Payer: Medicare Other | Attending: Internal Medicine | Admitting: Internal Medicine

## 2023-12-13 VITALS — BP 150/78 | HR 64 | Ht 60.0 in | Wt 192.2 lb

## 2023-12-13 DIAGNOSIS — E1169 Type 2 diabetes mellitus with other specified complication: Secondary | ICD-10-CM | POA: Diagnosis not present

## 2023-12-13 DIAGNOSIS — I1A0 Resistant hypertension: Secondary | ICD-10-CM | POA: Diagnosis not present

## 2023-12-13 DIAGNOSIS — R001 Bradycardia, unspecified: Secondary | ICD-10-CM | POA: Diagnosis not present

## 2023-12-13 DIAGNOSIS — Z79899 Other long term (current) drug therapy: Secondary | ICD-10-CM

## 2023-12-13 DIAGNOSIS — I701 Atherosclerosis of renal artery: Secondary | ICD-10-CM | POA: Insufficient documentation

## 2023-12-13 DIAGNOSIS — E785 Hyperlipidemia, unspecified: Secondary | ICD-10-CM

## 2023-12-13 MED ORDER — LISINOPRIL 40 MG PO TABS: 20.0000 mg | ORAL_TABLET | Freq: Every day | ORAL | 3 refills | Status: DC

## 2023-12-13 NOTE — Progress Notes (Signed)
 Cardiology Office Note:  .   Date:  12/13/2023  ID:  Lorraine Foley, DOB 1941/08/11, MRN [id number] PCP: Dellar Fenton, MD  [facility] Providers Cardiologist:  Sammy Crisp, MD Electrophysiologist:  Boyce Byes, MD     History of Present Illness: .   Lorraine Foley is a 83 y.o. female hypertension and type 2 diabetes mellitus, who presents for follow-up of hypertension and bradycardia.  I last saw her in January, at which time she reported overall improvement in her blood pressures with sodium restriction though she still noted some elevated readings.  Blood pressure was still significantly elevated in the office.  We agreed to increase HCTZ to 25 mg daily.  Prior renal artery Doppler suggested greater than 60% stenosis of the left renal artery; right renal artery was not well-visualized.  We discussed performing MRA to better assess for renal artery stenosis but agreed to defer this pending evaluation of serum aldosterone as well as escalation of her antihypertensive therapy.  Today, Lorraine Foley reports that she has been feeling fairly well, denying chest pain, palpitations, lightheadedness, and edema.  She notes some exertional dyspnea when doing strenuous activities, which has been stable.  She notes that her blood pressure has improved though systolic readings are usually in the 130s to 140s.  She has been trying to limit her sodium and calcium intake.  She is planning to travel to [place], [place], in about 2 weeks to spend the summer with her daughter.  ROS: See HPI  Studies Reviewed: Aaron Aas   EKG Interpretation Date/Time:  Wednesday December 13 2023 09:21:27 EDT Ventricular Rate:  64 PR Interval:  176 QRS Duration:  86 QT Interval:  366 QTC Calculation: 377 R Axis:   -21  Text Interpretation: Sinus rhythm with Premature supraventricular complexes Moderate voltage criteria for LVH, may be normal variant ( R in aVL , Cornell product ) Possible Anterior infarct (cited on or before  03-May-2023) Abnormal ECG When compared with ECG of 03-May-2023 09:22, Premature supraventricular complexes are now Present Confirmed by Karlo Goeden [phone]) on 12/13/2023 9:27:51 AM    Renal artery Doppler (06/06/2023): Normal left renal size.  Greater than 60% stenosis of the left renal artery.  Right renal artery not well-visualized.   TTE (06/06/2023): Normal LV size and wall thickness.  LVEF 60-65% with normal wall motion and grade 1 diastolic dysfunction.  Normal RV size and function.  Moderate pulmonary hypertension (RVSP 49 mmHg).  Normal biatrial size.  No pericardial effusion.  No significant valvular abnormalities.  Risk Assessment/Calculations:     HYPERTENSION CONTROL Vitals:   12/13/23 0916 12/13/23 0936  BP: (!) 142/70 (!) 150/78    The patient's blood pressure is elevated above target today.  In order to address the patient's elevated BP:           Physical Exam:   VS:  BP (!) 150/78 (BP Location: Left Arm)   Pulse 64   Ht 5' (1.524 m)   Wt 192 lb 3.2 oz (87.2 kg)   LMP 08/11/1995   SpO2 93%   BMI 37.54 kg/m    Wt Readings from Last 3 Encounters:  12/13/23 192 lb 3.2 oz (87.2 kg)  10/26/23 193 lb 9.6 oz (87.8 kg)  09/14/23 193 lb (87.5 kg)    General:  NAD. Neck: No JVD or HJR. Lungs: Clear to auscultation bilaterally without wheezes or crackles. Heart: Regular rate and rhythm without murmurs, rubs, or gallops. Abdomen: Soft, nontender, nondistended. Extremities: No lower  extremity edema.  ASSESSMENT AND PLAN: .    Resistant hypertension and renal artery stenosis: Blood pressures are a bit improved from prior visits but still overall mildly elevated (goal less than 130/80 in the setting of diabetes mellitus).  Serum aldosterone and plasma renin activity normal.  I have encouraged Lorraine Foley to continue limiting her sodium intake.  We will increase lisinopril to 40 mg daily.  Continue remainder of her medications.  We discussed pursuing MRA to better evaluate  her renal artery stenosis but will defer this for now given that she will be traveling to [place] for the summer in 2 weeks.  We will have her return shortly before she leaves the area for a BMP to ensure stable renal function and electrolytes with escalation of lisinopril.  Hyperlipidemia associated with type 2 diabetes mellitus: LDL well-controlled on last check in January.  Continue rosuvastatin and ongoing management of DM per Dr. Geralyn Knee.  Asymptomatic bradycardia: Heart rate low normal today.  No lightheadedness reported.  No further intervention recommended at this time.    Dispo: Return to clinic in mid September when she returns from [place].  Signed, Sammy Crisp, MD

## 2023-12-13 NOTE — Patient Instructions (Signed)
 Medication Instructions:   Please increase Lisinopril to 40 MG daily.  *If you need a refill on your cardiac medications before your next appointment, please call your pharmacy*  Lab Work: Your provider would like for you to return in 2 weeks to have the following labs drawn: BMP.   Please go to [facility] [address] [address], [place] [address] You do not need an appointment.  They are open from 8 am- 4:30 pm.  Lunch from 1:00 pm- 2:00 pm You will need to be fasting.    If you have labs (blood work) drawn today and your tests are completely normal, you will receive your results only by: MyChart Message (if you have MyChart) OR A paper copy in the mail If you have any lab test that is abnormal or we need to change your treatment, we will call you to review the results.  Testing/Procedures: None ordered.  Follow-Up: At [facility], you and your health needs are our priority.  As part of our continuing mission to provide you with exceptional heart care, our providers are all part of one team.  This team includes your primary Cardiologist (physician) and Advanced Practice Providers or APPs (Physician Assistants and Nurse Practitioners) who all work together to provide you with the care you need, when you need it.  Your next appointment:   5 month(s)  Provider:   You may see Sammy Crisp, MD or one of the following Advanced Practice Providers on your designated Care Team:   Laneta Pintos, NP Gildardo Labrador, PA-C Varney Gentleman, PA-C Cadence [place], PA-C Ronald Cockayne, NP Morey Ar, NP    We recommend signing up for the patient portal called "MyChart".  Sign up information is provided on this After Visit Summary.  MyChart is used to connect with patients for Virtual Visits (Telemedicine).  Patients are able to view lab/test results, encounter notes, upcoming appointments, etc.  Non-urgent messages can be sent to your provider  as well.   To learn more about what you can do with MyChart, go to [email or web address].

## 2023-12-14 ENCOUNTER — Other Ambulatory Visit (INDEPENDENT_AMBULATORY_CARE_PROVIDER_SITE_OTHER): Payer: Medicare Other

## 2023-12-14 DIAGNOSIS — E7800 Pure hypercholesterolemia, unspecified: Secondary | ICD-10-CM | POA: Diagnosis not present

## 2023-12-14 DIAGNOSIS — E1165 Type 2 diabetes mellitus with hyperglycemia: Secondary | ICD-10-CM | POA: Diagnosis not present

## 2023-12-14 LAB — BASIC METABOLIC PANEL WITH GFR
BUN: 35 mg/dL — ABNORMAL HIGH (ref 6–23)
CO2: 36 meq/L — ABNORMAL HIGH (ref 19–32)
Calcium: 10.4 mg/dL (ref 8.4–10.5)
Chloride: 97 meq/L (ref 96–112)
Creatinine, Ser: 1.23 mg/dL — ABNORMAL HIGH (ref 0.40–1.20)
GFR: 40.81 mL/min — ABNORMAL LOW (ref >60.00)
Glucose, Bld: 167 mg/dL — ABNORMAL HIGH (ref 70–99)
Potassium: 4.8 meq/L (ref 3.5–5.1)
Sodium: 140 meq/L (ref 135–145)

## 2023-12-14 LAB — HEPATIC FUNCTION PANEL
ALT: 11 U/L (ref 0–35)
AST: 14 U/L (ref 0–37)
Albumin: 4.4 g/dL (ref 3.5–5.2)
Alkaline Phosphatase: 41 U/L (ref 39–117)
Bilirubin, Direct: 0.1 mg/dL (ref 0.0–0.3)
Total Bilirubin: 0.7 mg/dL (ref 0.2–1.2)
Total Protein: 7.2 g/dL (ref 6.0–8.3)

## 2023-12-14 LAB — LIPID PANEL
Cholesterol: 136 mg/dL (ref 0–200)
HDL: 42.2 mg/dL (ref >39.00)
LDL Cholesterol: 59 mg/dL (ref 0–99)
NonHDL: 93.3
Total CHOL/HDL Ratio: 3
Triglycerides: 171 mg/dL — ABNORMAL HIGH (ref 0.0–149.0)
VLDL: 34.2 mg/dL (ref 0.0–40.0)

## 2023-12-14 LAB — HEMOGLOBIN A1C: Hgb A1c MFr Bld: 8.2 % — ABNORMAL HIGH (ref 4.6–6.5)

## 2023-12-18 ENCOUNTER — Ambulatory Visit (INDEPENDENT_AMBULATORY_CARE_PROVIDER_SITE_OTHER): Payer: Medicare Other | Admitting: Internal Medicine

## 2023-12-18 ENCOUNTER — Other Ambulatory Visit: Payer: Self-pay | Admitting: Internal Medicine

## 2023-12-18 VITALS — BP 130/70 | HR 70 | Temp 98.0°F | Resp 16 | Ht 60.0 in | Wt 193.2 lb

## 2023-12-18 DIAGNOSIS — Z7984 Long term (current) use of oral hypoglycemic drugs: Secondary | ICD-10-CM

## 2023-12-18 DIAGNOSIS — E1165 Type 2 diabetes mellitus with hyperglycemia: Secondary | ICD-10-CM | POA: Diagnosis not present

## 2023-12-18 DIAGNOSIS — N1832 Chronic kidney disease, stage 3b: Secondary | ICD-10-CM | POA: Diagnosis not present

## 2023-12-18 DIAGNOSIS — F439 Reaction to severe stress, unspecified: Secondary | ICD-10-CM

## 2023-12-18 DIAGNOSIS — Z7985 Long-term (current) use of injectable non-insulin antidiabetic drugs: Secondary | ICD-10-CM

## 2023-12-18 DIAGNOSIS — E785 Hyperlipidemia, unspecified: Secondary | ICD-10-CM

## 2023-12-18 DIAGNOSIS — I10 Essential (primary) hypertension: Secondary | ICD-10-CM

## 2023-12-18 DIAGNOSIS — E7800 Pure hypercholesterolemia, unspecified: Secondary | ICD-10-CM

## 2023-12-18 DIAGNOSIS — E039 Hypothyroidism, unspecified: Secondary | ICD-10-CM

## 2023-12-18 DIAGNOSIS — E1169 Type 2 diabetes mellitus with other specified complication: Secondary | ICD-10-CM

## 2023-12-18 NOTE — Progress Notes (Signed)
 Subjective:    Patient ID: Lorraine Foley, female    DOB: July 22, 1941, 83 y.o.   MRN: [id number]  Patient here for  Chief Complaint  Patient presents with   Medical Management of Chronic Issues    HPI Here for a scheduled follow up - follow up regarding hypercholesterolemia, diabetes and hypertension.  is followed by cardiology. 05/2023 - echocardiogram that was completed that revealed LVEF of 60 to 65%, no RWMA, G1 DD, moderately elevated pulmonary artery systolic pressures without concern for valvular abnormalities. Has been having problems with elevated blood pressure. Renal artery Doppler 05/2023 showed greater than 60% stenosis of the left renal artery. Right renal artery was not well-visualized. Saw Dr End 12/13/23 - lisinopril  increased to 40mg  daily. Brings in blood pressure readings. Reviewed outside readings - most averaging 130-140s/70-80s. Has improved overall. No chest pain reported. Breathing stable. No abdominal pain or bowel change reported. Overall appears to be feeling better. Planning to visit her daughter in [place]  and plans to stay for a few months.    Past Medical History:  Diagnosis Date   Arthritis    knees   Diabetes mellitus (HCC)    GERD (gastroesophageal reflux disease)    Glaucoma    Hypercholesterolemia    Hypertension    Hypothyroidism    Pneumonia    Past Surgical History:  Procedure Laterality Date   CATARACT EXTRACTION W/PHACO Left 11/09/2016   Procedure: CATARACT EXTRACTION PHACO AND INTRAOCULAR LENS PLACEMENT (IOC)  left diabetic complicated;  Surgeon: Annell Kidney, MD;  Location: [facility];  Service: Ophthalmology;  Laterality: Left;  Diabetic - oral meds   CATARACT EXTRACTION W/PHACO Right 03/08/2017   Procedure: CATARACT EXTRACTION PHACO AND INTRAOCULAR LENS PLACEMENT (IOC)  Right Diabetic complicated;  Surgeon: Annell Kidney, MD;  Location: [facility];  Service: Ophthalmology;  Laterality: Right;   EYE SURGERY  Bilateral 08/2014, 2.2016   laser surgery in preparation for glaucoma surgery   KNEE ARTHROPLASTY Left 08/06/2018   Procedure: COMPUTER ASSISTED TOTAL KNEE ARTHROPLASTY;  Surgeon: Arlyne Lame, MD;  Location: [facility];  Service: Orthopedics;  Laterality: Left;   TOTAL HIP ARTHROPLASTY  5/13   right   Family History  Problem Relation Age of Onset   Glaucoma Mother    Osteoporosis Mother    Mental illness Father        suicide   Liver disease Father        alcohol   Glaucoma Father    Alcohol abuse Father    Blindness Father    Diabetes Sister    Stroke Sister    Breast cancer Sister 76   Colon cancer Neg Hx    Social History   Socioeconomic History   Marital status: Divorced    Spouse name: Not on file   Number of children: 1   Years of education: Not on file   Highest education level: 12th grade  Occupational History   Not on file  Tobacco Use   Smoking status: Never   Smokeless tobacco: Never  Vaping Use   Vaping status: Never Used  Substance and Sexual Activity   Alcohol use: No    Alcohol/week: 0.0 standard drinks of alcohol   Drug use: No   Sexual activity: Not on file  Other Topics Concern   Not on file  Social History Narrative   Not on file   Social Drivers of Health   Financial Resource Strain: Low Risk  (08/29/2023)   Overall Financial  Resource Strain (CARDIA)    Difficulty of Paying Living Expenses: Not very hard  Food Insecurity: No Food Insecurity (08/29/2023)   Hunger Vital Sign    Worried About Running Out of Food in the Last Year: Never true    Ran Out of Food in the Last Year: Never true  Transportation Needs: No Transportation Needs (08/29/2023)   PRAPARE - Administrator, Civil Service (Medical): No    Lack of Transportation (Non-Medical): No  Physical Activity: Unknown (08/29/2023)   Exercise Vital Sign    Days of Exercise per Week: 0 days    Minutes of Exercise per Session: Not on file  Stress: No Stress Concern Present  (08/29/2023)   [employer] of Occupational Health - Occupational Stress Questionnaire    Feeling of Stress : Not at all  Social Connections: Unknown (08/29/2023)   Social Connection and Isolation Panel [NHANES]    Frequency of Communication with Friends and Family: More than three times a week    Frequency of Social Gatherings with Friends and Family: Once a week    Attends Religious Services: More than 4 times per year    Active Member of [employer] or Organizations: Patient declined    Attends Engineer, structural: Not on file    Marital Status: Divorced     Review of Systems  Constitutional:  Negative for appetite change and unexpected weight change.  HENT:  Negative for congestion and sinus pressure.   Respiratory:  Negative for cough, chest tightness and shortness of breath.   Cardiovascular:  Negative for chest pain, palpitations and leg swelling.  Gastrointestinal:  Negative for abdominal pain, diarrhea, nausea and vomiting.  Genitourinary:  Negative for difficulty urinating and dysuria.  Musculoskeletal:  Negative for joint swelling and myalgias.  Skin:  Negative for color change and rash.  Neurological:  Negative for dizziness and headaches.  Psychiatric/Behavioral:  Negative for agitation and dysphoric mood.        Objective:     BP 130/70   Pulse 70   Temp 98 F (36.7 C)   Resp 16   Ht 5' (1.524 m)   Wt 193 lb 3.2 oz (87.6 kg)   LMP 08/11/1995   SpO2 97%   BMI 37.73 kg/m  Wt Readings from Last 3 Encounters:  12/18/23 193 lb 3.2 oz (87.6 kg)  12/13/23 192 lb 3.2 oz (87.2 kg)  10/26/23 193 lb 9.6 oz (87.8 kg)    Physical Exam Vitals reviewed.  Constitutional:      General: She is not in acute distress.    Appearance: Normal appearance.  HENT:     Head: Normocephalic and atraumatic.     Right Ear: External ear normal.     Left Ear: External ear normal.     Mouth/Throat:     Pharynx: No oropharyngeal exudate or posterior oropharyngeal  erythema.  Eyes:     General: No scleral icterus.       Right eye: No discharge.        Left eye: No discharge.     Conjunctiva/sclera: Conjunctivae normal.  Neck:     Thyroid : No thyromegaly.  Cardiovascular:     Rate and Rhythm: Normal rate and regular rhythm.  Pulmonary:     Effort: No respiratory distress.     Breath sounds: Normal breath sounds. No wheezing.  Abdominal:     General: Bowel sounds are normal.     Palpations: Abdomen is soft.     Tenderness: There  is no abdominal tenderness.  Musculoskeletal:        General: No swelling or tenderness.     Cervical back: Neck supple. No tenderness.  Lymphadenopathy:     Cervical: No cervical adenopathy.  Skin:    Findings: No erythema or rash.  Neurological:     Mental Status: She is alert.  Psychiatric:        Mood and Affect: Mood normal.        Behavior: Behavior normal.         Outpatient Encounter Medications as of 12/18/2023  Medication Sig   acetaminophen  (TYLENOL ) 650 MG CR tablet Take 650 mg by mouth daily as needed for pain.   amLODipine  (NORVASC ) 5 MG tablet TAKE 1 TABLET(5 MG) BY MOUTH TWICE DAILY   aspirin EC 81 MG tablet Take 81 mg by mouth daily. Swallow whole.   blood glucose meter kit and supplies Dispense based on patient and insurance preference. Use up to four times daily as directed. (FOR ICD-10 E10.9, E11.9).   Cholecalciferol  (VITAMIN D -3) 1000 UNITS CAPS Take 1 capsule by mouth daily.   cloNIDine  (CATAPRES ) 0.1 MG tablet TAKE 1 TABLET(0.1 MG) BY MOUTH THREE TIMES DAILY   glucosamine-chondroitin 500-400 MG tablet Take 1 tablet by mouth daily.   glucose blood (ACCU-CHEK GUIDE TEST) test strip Use as instructed to check blood sugar once daily   glucose blood (CONTOUR NEXT TEST) test strip TEST TWICE DAILY AS DIRECTED   hydrochlorothiazide  (HYDRODIURIL ) 25 MG tablet Take 1 tablet (25 mg total) by mouth daily.   JARDIANCE  25 MG TABS tablet TAKE 1 TABLET(25 MG) BY MOUTH DAILY BEFORE BREAKFAST    Lancets (ONETOUCH ULTRASOFT) lancets Use as instructed to check blood sugars once daily. Dx E11.9   Lancets MISC Use as directed to check blood sugars once daily   levothyroxine  (SYNTHROID ) 100 MCG tablet TAKE 1 TABLET(100 MCG) BY MOUTH DAILY   lisinopril  (ZESTRIL ) 40 MG tablet Take 0.5 tablets (20 mg total) by mouth daily.   ROCKLATAN 0.02-0.005 % SOLN Apply 1 drop to eye at bedtime.   rosuvastatin  (CRESTOR ) 20 MG tablet TAKE 1 TABLET(20 MG) BY MOUTH DAILY   Semaglutide ,0.25 or 0.5MG /DOS, (OZEMPIC , 0.25 OR 0.5 MG/DOSE,) 2 MG/3ML SOPN Inject 0.5 mg into the skin once a week.   timolol  (TIMOPTIC ) 0.25 % ophthalmic solution Place 1 drop into both eyes 2 (two) times daily.   Turmeric 500 MG CAPS Take 1 capsule by mouth daily.   vitamin C  (ASCORBIC ACID ) 500 MG tablet Take 500 mg by mouth daily.   vitamin E 200 UNIT capsule Take 200 Units by mouth daily.   [DISCONTINUED] amLODipine  (NORVASC ) 5 MG tablet Take 1 tablet (5 mg total) by mouth 2 (two) times daily.   [DISCONTINUED] cloNIDine  (CATAPRES ) 0.1 MG tablet TAKE 1 TABLET(0.1 MG) BY MOUTH THREE TIMES DAILY   [DISCONTINUED] rosuvastatin  (CRESTOR ) 20 MG tablet TAKE 1 TABLET(20 MG) BY MOUTH DAILY   No facility-administered encounter medications on file as of 12/18/2023.     Lab Results  Component Value Date   WBC 6.7 01/10/2023   HGB 13.4 01/10/2023   HCT 39.6 01/10/2023   PLT 257.0 01/10/2023   GLUCOSE 167 (H) 12/14/2023   CHOL 136 12/14/2023   TRIG 171.0 (H) 12/14/2023   HDL 42.20 12/14/2023   LDLDIRECT 80.0 04/28/2022   LDLCALC 59 12/14/2023   ALT 11 12/14/2023   AST 14 12/14/2023   NA 140 12/14/2023   K 4.8 12/14/2023   CL 97 12/14/2023  CREATININE 1.23 (H) 12/14/2023   BUN 35 (H) 12/14/2023   CO2 36 (H) 12/14/2023   TSH 0.87 01/10/2023   INR 1.00 07/24/2018   HGBA1C 8.2 (H) 12/14/2023   MICROALBUR 1.0 10/12/2022       Assessment & Plan:  Stage 3b chronic kidney disease (HCC) Assessment & Plan: Continue to avoid  antiinflammatories.  Continue ace inhibitor.  Dose just recently increased. On hydrochlorothiazide .  Recently increased to one whole tablet per day by cardiology.  Continue f/u with nephrology.  Follow metabolic panel.     Hypercholesterolemia -     Basic metabolic panel with GFR; Future -     Hepatic function panel; Future -     Lipid panel; Future  Type 2 diabetes mellitus with hyperglycemia, without long-term current use of insulin  (HCC) Assessment & Plan: Continues on jardiance . On ozempic . Tolerating. Reviewed outside sugar readings - most averaging 150-180s. Continue low carb diet and exercise. Follow met b and A1c.   Lab Results  Component Value Date   HGBA1C 8.2 (H) 12/14/2023     Orders: -     Hemoglobin A1c; Future  Hypothyroidism, unspecified type Assessment & Plan: On thyroid  replacement. Follow tsh.   Orders: -     TSH; Future  Hyperlipidemia associated with type 2 diabetes mellitus (HCC) Assessment & Plan: Continue pravastatin . Low cholesterol diet and exercise. Follow lipid panel and liver function tests.   Lab Results  Component Value Date   CHOL 136 12/14/2023   HDL 42.20 12/14/2023   LDLCALC 59 12/14/2023   LDLDIRECT 80.0 04/28/2022   TRIG 171.0 (H) 12/14/2023   CHOLHDL 3 12/14/2023      Primary hypertension Assessment & Plan: Continues on amlodipine , clonidine  and now on lisinopril  40mg  q day.  Back on hydrochlorothiazide  and now taking one whole tablet per day.  Renal artery doppler ordered by cardiology - at least moderate left renal artery stenosis.  Right kidney poorly visualized. Continues on jardiance .  Follow pressures.  Blood pressure overall doing better. Hold on changing medication. Follow pressures. Recheck by me today 134/70.    Stress Assessment & Plan: Overall appears to be doing better. Follow.       Dellar Fenton, MD

## 2023-12-24 ENCOUNTER — Encounter: Payer: Self-pay | Admitting: Internal Medicine

## 2023-12-24 NOTE — Assessment & Plan Note (Signed)
 Continues on jardiance . On ozempic . Tolerating. Reviewed outside sugar readings - most averaging 150-180s. Continue low carb diet and exercise. Follow met b and A1c.   Lab Results  Component Value Date   HGBA1C 8.2 (H) 12/14/2023

## 2023-12-24 NOTE — Assessment & Plan Note (Signed)
 Continue to avoid antiinflammatories.  Continue ace inhibitor.  Dose just recently increased. On hydrochlorothiazide .  Recently increased to one whole tablet per day by cardiology.  Continue f/u with nephrology.  Follow metabolic panel.

## 2023-12-24 NOTE — Assessment & Plan Note (Signed)
Overall appears to be doing better.  Follow.  

## 2023-12-24 NOTE — Assessment & Plan Note (Signed)
 Continues on amlodipine , clonidine  and now on lisinopril  40mg  q day.  Back on hydrochlorothiazide  and now taking one whole tablet per day.  Renal artery doppler ordered by cardiology - at least moderate left renal artery stenosis.  Right kidney poorly visualized. Continues on jardiance .  Follow pressures.  Blood pressure overall doing better. Hold on changing medication. Follow pressures. Recheck by me today 134/70.

## 2023-12-24 NOTE — Assessment & Plan Note (Signed)
 Continue pravastatin . Low cholesterol diet and exercise. Follow lipid panel and liver function tests.   Lab Results  Component Value Date   CHOL 136 12/14/2023   HDL 42.20 12/14/2023   LDLCALC 59 12/14/2023   LDLDIRECT 80.0 04/28/2022   TRIG 171.0 (H) 12/14/2023   CHOLHDL 3 12/14/2023

## 2023-12-24 NOTE — Assessment & Plan Note (Signed)
 On thyroid replacement.  Follow tsh.

## 2023-12-25 DIAGNOSIS — Z79899 Other long term (current) drug therapy: Secondary | ICD-10-CM | POA: Diagnosis not present

## 2023-12-26 LAB — BASIC METABOLIC PANEL WITH GFR
BUN/Creatinine Ratio: 20 (ref 12–28)
BUN: 27 mg/dL (ref 8–27)
CO2: 25 mmol/L (ref 20–29)
Calcium: 10.3 mg/dL (ref 8.7–10.3)
Chloride: 99 mmol/L (ref 96–106)
Creatinine, Ser: 1.35 mg/dL — ABNORMAL HIGH (ref 0.57–1.00)
Glucose: 204 mg/dL — ABNORMAL HIGH (ref 70–99)
Potassium: 5.2 mmol/L (ref 3.5–5.2)
Sodium: 141 mmol/L (ref 134–144)
eGFR: 39 mL/min/{1.73_m2} — ABNORMAL LOW (ref >59)

## 2024-01-01 ENCOUNTER — Telehealth: Payer: Self-pay | Admitting: Internal Medicine

## 2024-01-01 MED ORDER — LISINOPRIL 40 MG PO TABS: 40.0000 mg | ORAL_TABLET | Freq: Every day | ORAL | 3 refills | Status: AC

## 2024-01-01 NOTE — Telephone Encounter (Signed)
 Pt c/o medication issue:  1. Name of Medication: lisinopril  (ZESTRIL ) 40 MG tablet   2. How are you currently taking this medication (dosage and times per day)?   3. Are you having a reaction (difficulty breathing--STAT)?   4. What is your medication issue? Dose was increased to 40mg , script that was sent to pharmacy was for 1/2 tablet, she needs a new script to be sent for a whole tablet.  They only gave her 15 tablets since it was written to take 1/2  tablet.  She would like a 90 day supply.

## 2024-01-01 NOTE — Telephone Encounter (Signed)
 Called and spoke with patient. Refill request sent to preferred pharmacy.

## 2024-01-08 ENCOUNTER — Other Ambulatory Visit: Payer: Self-pay | Admitting: Internal Medicine

## 2024-01-24 ENCOUNTER — Ambulatory Visit

## 2024-02-14 ENCOUNTER — Other Ambulatory Visit: Payer: Self-pay | Admitting: Internal Medicine

## 2024-03-17 ENCOUNTER — Other Ambulatory Visit: Payer: Self-pay | Admitting: Internal Medicine

## 2024-04-15 ENCOUNTER — Ambulatory Visit (INDEPENDENT_AMBULATORY_CARE_PROVIDER_SITE_OTHER): Admitting: *Deleted

## 2024-04-15 VITALS — BP 137/62 | Ht 60.0 in | Wt 190.0 lb

## 2024-04-15 DIAGNOSIS — Z0000 Encounter for general adult medical examination without abnormal findings: Secondary | ICD-10-CM

## 2024-04-15 NOTE — Patient Instructions (Signed)
 Lorraine Foley , Thank you for taking time out of your busy schedule to complete your Annual Wellness Visit with me. I enjoyed our conversation and look forward to speaking with you again next year. I, as well as your care team,  appreciate your ongoing commitment to your health goals. Please review the following plan we discussed and let me know if I can assist you in the future. Your Game plan/ To Do List    Referrals: If you haven't heard from the office you've been referred to, please reach out to them at the phone provided.  Remember to update your flu and covid vaccines annually.  Make sure that you call about your diabetic eye exam.  Follow up Visits: We will see or speak with you next year for your Next Medicare AWV with our clinical staff 04/21/25 @ 9:30 Have you seen your provider in the last 6 months (3 months if uncontrolled diabetes)? Yes  Clinician Recommendations:  Aim for 30 minutes of exercise or brisk walking, 6-8 glasses of water, and 5 servings of fruits and vegetables each day.       This is a list of the screenings recommended for you:  Health Maintenance  Topic Date Due   COVID-19 Vaccine (7 - Pfizer risk 2024-25 season) 12/15/2023   Eye exam for diabetics  12/19/2023   Flu Shot  03/29/2024   Hemoglobin A1C  06/14/2024   Mammogram  06/18/2024   Yearly kidney health urinalysis for diabetes  10/11/2024   Complete foot exam   10/25/2024   Yearly kidney function blood test for diabetes  12/24/2024   Medicare Annual Wellness Visit  04/15/2025   DTaP/Tdap/Td vaccine (2 - Td or Tdap) 05/16/2032   Pneumococcal Vaccine for age over 87  Completed   DEXA scan (bone density measurement)  Completed   Zoster (Shingles) Vaccine  Completed   HPV Vaccine  Aged Out   Meningitis B Vaccine  Aged Out    Advanced directives: (Declined) Advance directive discussed with you today. Even though you declined this today, please call our office should you change your mind, and we can give you  the proper paperwork for you to fill out. Advance Care Planning is important because it:  [x]  Makes sure you receive the medical care that is consistent with your values, goals, and preferences  [x]  It provides guidance to your family and loved ones and reduces their decisional burden about whether or not they are making the right decisions based on your wishes.

## 2024-04-15 NOTE — Progress Notes (Signed)
 Subjective:   Lorraine Foley is a 83 y.o. who presents for a Medicare Wellness preventive visit.  As a reminder, Annual Wellness Visits don't include a physical exam, and some assessments may be limited, especially if this visit is performed virtually. We may recommend an in-person follow-up visit with your provider if needed.  Visit Complete: Virtual I connected with  Lorraine Foley on 04/15/24 by a audio enabled telemedicine application and verified that I am speaking with the correct person using two identifiers.  Patient Location: Home at her daughters home in [place]  Provider Location: Home Office  I discussed the limitations of evaluation and management by telemedicine. The patient expressed understanding and agreed to proceed.  Vital Signs: Because this visit was a virtual/telehealth visit, some criteria may be missing or patient reported. Any vitals not documented were not able to be obtained and vitals that have been documented are patient reported.  VideoDeclined- This patient declined Librarian, academic. Therefore the visit was completed with audio only.  Persons Participating in Visit: Patient.  AWV Questionnaire: No: Patient Medicare AWV questionnaire was not completed prior to this visit.  Cardiac Risk Factors include: advanced age (>32men, >53 women);diabetes mellitus;hypertension;dyslipidemia;obesity (BMI >30kg/m2)     Objective:    Today's Vitals   04/15/24 1132  BP: 137/62  Weight: 190 lb (86.2 kg)  Height: 5' (1.524 m)   Body mass index is 37.11 kg/m.     04/15/2024   11:50 AM 05/22/2023    1:32 PM 03/02/2022   10:06 AM 08/06/2018    4:57 PM 07/24/2018    1:53 PM 03/08/2017    6:59 AM 11/09/2016    7:58 AM  Advanced Directives  Does Patient Have a Medical Advance Directive? No No Yes No  No  No  No   Type of Surveyor, minerals;Living will      Does patient want to make changes to medical advance  directive?   No - Patient declined   Yes (MAU/Ambulatory/Procedural Areas - Information given)    Copy of Healthcare Power of Attorney in Chart?   No - copy requested      Would patient like information on creating a medical advance directive? No - Patient declined Yes (MAU/Ambulatory/Procedural Areas - Information given)  Yes (Inpatient - patient requests chaplain consult to create a medical advance directive)  Yes (MAU/Ambulatory/Procedural Areas - Information given)   Yes (MAU/Ambulatory/Procedural Areas - Information given)      Data saved with a previous flowsheet row definition    Current Medications (verified) Outpatient Encounter Medications as of 04/15/2024  Medication Sig   acetaminophen  (TYLENOL ) 650 MG CR tablet Take 650 mg by mouth daily as needed for pain.   amLODipine  (NORVASC ) 5 MG tablet TAKE 1 TABLET(5 MG) BY MOUTH TWICE DAILY   aspirin EC 81 MG tablet Take 81 mg by mouth daily. Swallow whole.   blood glucose meter kit and supplies Dispense based on patient and insurance preference. Use up to four times daily as directed. (FOR ICD-10 E10.9, E11.9).   Cholecalciferol  (VITAMIN D -3) 1000 UNITS CAPS Take 1 capsule by mouth daily.   cloNIDine  (CATAPRES ) 0.1 MG tablet TAKE 1 TABLET(0.1 MG) BY MOUTH THREE TIMES DAILY   glucosamine-chondroitin 500-400 MG tablet Take 1 tablet by mouth daily.   glucose blood (ACCU-CHEK GUIDE TEST) test strip Use as instructed to check blood sugar once daily   glucose blood (CONTOUR NEXT TEST) test strip TEST TWICE  DAILY AS DIRECTED   hydrochlorothiazide  (HYDRODIURIL ) 25 MG tablet Take 1 tablet (25 mg total) by mouth daily.   JARDIANCE  25 MG TABS tablet TAKE 1 TABLET(25 MG) BY MOUTH DAILY BEFORE BREAKFAST   Lancets (ONETOUCH ULTRASOFT) lancets Use as instructed to check blood sugars once daily. Dx E11.9   Lancets MISC Use as directed to check blood sugars once daily   levothyroxine  (SYNTHROID ) 100 MCG tablet TAKE 1 TABLET(100 MCG) BY MOUTH DAILY    lisinopril  (ZESTRIL ) 40 MG tablet Take 1 tablet (40 mg total) by mouth daily.   OZEMPIC , 0.25 OR 0.5 MG/DOSE, 2 MG/3ML SOPN INJECT 0.5 MG INTO THE SKIN ONCE A WEEK   ROCKLATAN 0.02-0.005 % SOLN Apply 1 drop to eye at bedtime.   rosuvastatin  (CRESTOR ) 20 MG tablet TAKE 1 TABLET(20 MG) BY MOUTH DAILY   timolol  (TIMOPTIC ) 0.25 % ophthalmic solution Place 1 drop into both eyes 2 (two) times daily.   Turmeric 500 MG CAPS Take 1 capsule by mouth daily.   vitamin C  (ASCORBIC ACID ) 500 MG tablet Take 500 mg by mouth daily.   vitamin E 200 UNIT capsule Take 200 Units by mouth daily.   No facility-administered encounter medications on file as of 04/15/2024.    Allergies (verified) Metformin and related   History: Past Medical History:  Diagnosis Date   Arthritis    knees   Diabetes mellitus (HCC)    GERD (gastroesophageal reflux disease)    Glaucoma    Hypercholesterolemia    Hypertension    Hypothyroidism    Pneumonia    Past Surgical History:  Procedure Laterality Date   CATARACT EXTRACTION W/PHACO Left 11/09/2016   Procedure: CATARACT EXTRACTION PHACO AND INTRAOCULAR LENS PLACEMENT (IOC)  left diabetic complicated;  Surgeon: Dene Etienne, MD;  Location: [facility];  Service: Ophthalmology;  Laterality: Left;  Diabetic - oral meds   CATARACT EXTRACTION W/PHACO Right 03/08/2017   Procedure: CATARACT EXTRACTION PHACO AND INTRAOCULAR LENS PLACEMENT (IOC)  Right Diabetic complicated;  Surgeon: Etienne Dene, MD;  Location: [facility];  Service: Ophthalmology;  Laterality: Right;   EYE SURGERY Bilateral 08/2014, 2.2016   laser surgery in preparation for glaucoma surgery   KNEE ARTHROPLASTY Left 08/06/2018   Procedure: COMPUTER ASSISTED TOTAL KNEE ARTHROPLASTY;  Surgeon: Mardee Lynwood SQUIBB, MD;  Location: [facility];  Service: Orthopedics;  Laterality: Left;   TOTAL HIP ARTHROPLASTY  5/13   right   Family History  Problem Relation Age of Onset   Glaucoma Mother     Osteoporosis Mother    Mental illness Father        suicide   Liver disease Father        alcohol   Glaucoma Father    Alcohol abuse Father    Blindness Father    Diabetes Sister    Stroke Sister    Breast cancer Sister 17   Colon cancer Neg Hx    Social History   Socioeconomic History   Marital status: Divorced    Spouse name: Not on file   Number of children: 1   Years of education: Not on file   Highest education level: 12th grade  Occupational History   Not on file  Tobacco Use   Smoking status: Never   Smokeless tobacco: Never  Vaping Use   Vaping status: Never Used  Substance and Sexual Activity   Alcohol use: No    Alcohol/week: 0.0 standard drinks of alcohol   Drug use: No   Sexual activity:  Not on file  Other Topics Concern   Not on file  Social History Narrative   Not on file   Social Drivers of Health   Financial Resource Strain: Low Risk  (04/15/2024)   Overall Financial Resource Strain (CARDIA)    Difficulty of Paying Living Expenses: Not hard at all  Food Insecurity: No Food Insecurity (04/15/2024)   Hunger Vital Sign    Worried About Running Out of Food in the Last Year: Never true    Ran Out of Food in the Last Year: Never true  Transportation Needs: No Transportation Needs (04/15/2024)   PRAPARE - Administrator, Civil Service (Medical): No    Lack of Transportation (Non-Medical): No  Physical Activity: Inactive (04/15/2024)   Exercise Vital Sign    Days of Exercise per Week: 0 days    Minutes of Exercise per Session: 0 min  Stress: No Stress Concern Present (04/15/2024)   [employer] of Occupational Health - Occupational Stress Questionnaire    Feeling of Stress: Not at all  Social Connections: Socially Isolated (04/15/2024)   Social Connection and Isolation Panel    Frequency of Communication with Friends and Family: More than three times a week    Frequency of Social Gatherings with Friends and Family: More than three times a  week    Attends Religious Services: Never    Database administrator or Organizations: No    Attends Engineer, structural: Never    Marital Status: Divorced    Tobacco Counseling Counseling given: Not Answered    Clinical Intake:  Pre-visit preparation completed: Yes  Pain : No/denies pain     BMI - recorded: 37.11 Nutritional Status: BMI > 30  Obese Nutritional Risks: None Diabetes: Yes CBG done?: Yes (per patient FBS 139)  Lab Results  Component Value Date   HGBA1C 8.2 (H) 12/14/2023   HGBA1C 8.8 (H) 08/31/2023   HGBA1C 7.7 (H) 05/19/2023     How often do you need to have someone help you when you read instructions, pamphlets, or other written materials from your doctor or pharmacy?: 1 - Never  Interpreter Needed?: No  Information entered by :: R. Maybree Riling  LPN   Activities of Daily Living     04/15/2024   11:38 AM  In your present state of health, do you have any difficulty performing the following activities:  Hearing? 0  Vision? 0  Difficulty concentrating or making decisions? 0  Walking or climbing stairs? 0  Dressing or bathing? 0  Doing errands, shopping? 0  Preparing Food and eating ? N  Using the Toilet? N  In the past six months, have you accidently leaked urine? N  Do you have problems with loss of bowel control? N  Managing your Medications? N  Managing your Finances? N  Housekeeping or managing your Housekeeping? N    Patient Care Team: Glendia Shad, MD as PCP - General (Internal Medicine) Cindie Ole DASEN, MD as PCP - Electrophysiology (Cardiology) End, Lonni, MD as PCP - Cardiology (Cardiology) Pa,  Eye Care (Optometry)  I have updated your Care Teams any recent Medical Services you may have received from other providers in the past year.     Assessment:   This is a routine wellness examination for Lorraine Foley.  Hearing/Vision screen Hearing Screening - Comments:: No issues Vision Screening - Comments::  glasses   Goals Addressed             This Visit's Progress  Patient Stated       Wants to lose some weight       Depression Screen     04/15/2024   11:45 AM 05/22/2023    1:33 PM 05/11/2023    7:18 AM 10/12/2022   10:04 AM 05/19/2022    8:05 AM 04/28/2022    9:24 AM 03/02/2022    9:54 AM  PHQ 2/9 Scores  PHQ - 2 Score 0 0 0 0 0 0 0  PHQ- 9 Score 1  3        Fall Risk     04/15/2024   11:40 AM 05/22/2023    1:33 PM 05/11/2023    7:18 AM 10/12/2022   10:04 AM 05/19/2022    8:05 AM  Fall Risk   Falls in the past year? 0 0 0 0 0  Number falls in past yr: 0  0 0   Injury with Fall? 0  0 0   Risk for fall due to : No Fall Risks  No Fall Risks No Fall Risks No Fall Risks  Follow up Falls evaluation completed;Falls prevention discussed  Falls evaluation completed Falls evaluation completed Falls evaluation completed      Data saved with a previous flowsheet row definition    MEDICARE RISK AT HOME:  Medicare Risk at Home Any stairs in or around the home?: Yes If so, are there any without handrails?: No Home free of loose throw rugs in walkways, pet beds, electrical cords, etc?: Yes Adequate lighting in your home to reduce risk of falls?: Yes Life alert?: No Use of a cane, walker or w/c?: Yes (walker at times) Grab bars in the bathroom?: Yes Shower chair or bench in shower?: Yes Elevated toilet seat or a handicapped toilet?: Yes  TIMED UP AND GO:  Was the test performed?  No  Cognitive Function: 6CIT completed        04/15/2024   11:50 AM  6CIT Screen  What Year? 0 points  What month? 0 points  What time? 0 points  Count back from 20 0 points  Months in reverse 0 points  Repeat phrase 0 points  Total Score 0 points    Immunizations Immunization History  Administered Date(s) Administered   Fluad Quad(high Dose 65+) 05/13/2019   Fluad Trivalent(High Dose 65+) 05/11/2023   Influenza Split 07/14/2014   Influenza Whole 07/17/2012   Influenza, High Dose  Seasonal PF 06/06/2017, 06/06/2018, 05/16/2022   Influenza-Unspecified 06/20/2015, 06/14/2016, 05/16/2020, 05/12/2021   PFIZER(Purple Top)SARS-COV-2 Vaccination 09/04/2019, 09/28/2019, 05/27/2020, 05/12/2021   Pfizer(Comirnaty)Fall Seasonal Vaccine 12 years and older 06/07/2022, 06/16/2023   Pneumococcal Conjugate-13 11/05/2013   Pneumococcal Polysaccharide-23 09/26/2011   Tdap 05/16/2022   Zoster Recombinant(Shingrix) 05/09/2018, 11/01/2018    Screening Tests Health Maintenance  Topic Date Due   COVID-19 Vaccine (7 - Pfizer risk 2024-25 season) 12/15/2023   OPHTHALMOLOGY EXAM  12/19/2023   INFLUENZA VACCINE  03/29/2024   HEMOGLOBIN A1C  06/14/2024   MAMMOGRAM  06/18/2024   Diabetic kidney evaluation - Urine ACR  10/11/2024   FOOT EXAM  10/25/2024   Diabetic kidney evaluation - eGFR measurement  12/24/2024   Medicare Annual Wellness (AWV)  04/15/2025   DTaP/Tdap/Td (2 - Td or Tdap) 05/16/2032   Pneumococcal Vaccine: 50+ Years  Completed   DEXA SCAN  Completed   Zoster Vaccines- Shingrix  Completed   HPV VACCINES  Aged Out   Meningococcal B Vaccine  Aged Out    Health Maintenance  Health Maintenance Due  Topic  Date Due   COVID-19 Vaccine (7 - Pfizer risk 2024-25 season) 12/15/2023   OPHTHALMOLOGY EXAM  12/19/2023   INFLUENZA VACCINE  03/29/2024   Health Maintenance Items Addressed: Discussed the need to update covid and flu vaccines annually.   Additional Screening:  Vision Screening: Recommended annual ophthalmology exams for early detection of glaucoma and other disorders of the eye. [facility]  Patient has an upcoming appointment scheduled in October and will call and make sure she is scheduled for a diabetic exam and will have notes sent to the office.  Would you like a referral to an eye doctor? No    Dental Screening: Recommended annual dental exams for proper oral hygiene  Community Resource Referral / Chronic Care Management: CRR required this visit?  No    CCM required this visit?  No   Plan:    I have personally reviewed and noted the following in the patient's chart:   Medical and social history Use of alcohol, tobacco or illicit drugs  Current medications and supplements including opioid prescriptions. Patient is not currently taking opioid prescriptions. Functional ability and status Nutritional status Physical activity Advanced directives List of other physicians Hospitalizations, surgeries, and ER visits in previous 12 months Vitals Screenings to include cognitive, depression, and falls Referrals and appointments  In addition, I have reviewed and discussed with patient certain preventive protocols, quality metrics, and best practice recommendations. A written personalized care plan for preventive services as well as general preventive health recommendations were provided to patient.   Angeline Fredericks, LPN   [id number]   After Visit Summary: (MyChart) Due to this being a telephonic visit, the after visit summary with patients personalized plan was offered to patient via MyChart   Notes: Nothing significant to report at this time.

## 2024-05-01 ENCOUNTER — Other Ambulatory Visit: Payer: Self-pay | Admitting: Internal Medicine

## 2024-05-02 ENCOUNTER — Other Ambulatory Visit

## 2024-05-03 ENCOUNTER — Other Ambulatory Visit

## 2024-05-03 DIAGNOSIS — E039 Hypothyroidism, unspecified: Secondary | ICD-10-CM

## 2024-05-03 DIAGNOSIS — E7800 Pure hypercholesterolemia, unspecified: Secondary | ICD-10-CM | POA: Diagnosis not present

## 2024-05-03 DIAGNOSIS — E1165 Type 2 diabetes mellitus with hyperglycemia: Secondary | ICD-10-CM

## 2024-05-03 LAB — BASIC METABOLIC PANEL WITH GFR
BUN: 26 mg/dL — ABNORMAL HIGH (ref 6–23)
CO2: 35 meq/L — ABNORMAL HIGH (ref 19–32)
Calcium: 10 mg/dL (ref 8.4–10.5)
Chloride: 97 meq/L (ref 96–112)
Creatinine, Ser: 1.21 mg/dL — ABNORMAL HIGH (ref 0.40–1.20)
GFR: 41.5 mL/min — ABNORMAL LOW (ref >60.00)
Glucose, Bld: 137 mg/dL — ABNORMAL HIGH (ref 70–99)
Potassium: 4.7 meq/L (ref 3.5–5.1)
Sodium: 140 meq/L (ref 135–145)

## 2024-05-03 LAB — LIPID PANEL
Cholesterol: 132 mg/dL (ref 0–200)
HDL: 43.7 mg/dL (ref >39.00)
LDL Cholesterol: 55 mg/dL (ref 0–99)
NonHDL: 88.52
Total CHOL/HDL Ratio: 3
Triglycerides: 166 mg/dL — ABNORMAL HIGH (ref 0.0–149.0)
VLDL: 33.2 mg/dL (ref 0.0–40.0)

## 2024-05-03 LAB — HEPATIC FUNCTION PANEL
ALT: 11 U/L (ref 0–35)
AST: 15 U/L (ref 0–37)
Albumin: 4.4 g/dL (ref 3.5–5.2)
Alkaline Phosphatase: 39 U/L (ref 39–117)
Bilirubin, Direct: 0.1 mg/dL (ref 0.0–0.3)
Total Bilirubin: 0.8 mg/dL (ref 0.2–1.2)
Total Protein: 7 g/dL (ref 6.0–8.3)

## 2024-05-03 LAB — TSH: TSH: 0.16 u[IU]/mL — ABNORMAL LOW (ref 0.35–5.50)

## 2024-05-03 LAB — HEMOGLOBIN A1C: Hgb A1c MFr Bld: 8.1 % — ABNORMAL HIGH (ref 4.6–6.5)

## 2024-05-04 ENCOUNTER — Ambulatory Visit: Payer: Self-pay | Admitting: Internal Medicine

## 2024-05-06 ENCOUNTER — Ambulatory Visit: Admitting: Internal Medicine

## 2024-05-06 VITALS — BP 118/70 | HR 62 | Resp 16 | Ht 60.0 in | Wt 181.8 lb

## 2024-05-06 DIAGNOSIS — G4733 Obstructive sleep apnea (adult) (pediatric): Secondary | ICD-10-CM

## 2024-05-06 DIAGNOSIS — E1169 Type 2 diabetes mellitus with other specified complication: Secondary | ICD-10-CM | POA: Diagnosis not present

## 2024-05-06 DIAGNOSIS — E1165 Type 2 diabetes mellitus with hyperglycemia: Secondary | ICD-10-CM | POA: Diagnosis not present

## 2024-05-06 DIAGNOSIS — E785 Hyperlipidemia, unspecified: Secondary | ICD-10-CM

## 2024-05-06 DIAGNOSIS — Z7984 Long term (current) use of oral hypoglycemic drugs: Secondary | ICD-10-CM

## 2024-05-06 DIAGNOSIS — Z7985 Long-term (current) use of injectable non-insulin antidiabetic drugs: Secondary | ICD-10-CM

## 2024-05-06 DIAGNOSIS — F439 Reaction to severe stress, unspecified: Secondary | ICD-10-CM

## 2024-05-06 DIAGNOSIS — Z1231 Encounter for screening mammogram for malignant neoplasm of breast: Secondary | ICD-10-CM

## 2024-05-06 DIAGNOSIS — E039 Hypothyroidism, unspecified: Secondary | ICD-10-CM

## 2024-05-06 DIAGNOSIS — N1832 Chronic kidney disease, stage 3b: Secondary | ICD-10-CM | POA: Diagnosis not present

## 2024-05-06 DIAGNOSIS — I10 Essential (primary) hypertension: Secondary | ICD-10-CM

## 2024-05-06 MED ORDER — LEVOTHYROXINE SODIUM 88 MCG PO TABS: 88.0000 ug | ORAL_TABLET | Freq: Every day | ORAL | 1 refills | Status: DC

## 2024-05-06 NOTE — Progress Notes (Unsigned)
 Subjective:    Patient ID: Lorraine Foley, female    DOB: [birth year], 83 y.o.   MRN: [id number]  Patient here for  Chief Complaint  Patient presents with   Medical Management of Chronic Issues    HPI Here for a scheduled follow up - follow up regarding hypercholesterolemia, diabetes and hypertension.  is followed by cardiology. 05/2023 - echocardiogram that was completed that revealed LVEF of 60 to 65%, no RWMA, G1 DD, moderately elevated pulmonary artery systolic pressures without concern for valvular abnormalities. Has been having problems with elevated blood pressure. Renal artery Doppler 05/2023 showed greater than 60% stenosis of the left renal artery. Right renal artery was not well-visualized. Saw Dr End 12/13/23 - lisinopril  increased to 40mg  daily.  Reviewed recent labs. A1c has decreased some - 8.1. blood pressure is doing better overall. She just returned from [place] . Spent the summer there with her daughter. More active. Feels better. No chest pain reported. Breathing stable. No increased sob. Has lost 10 pounds. Reviewed outside blood pressures - most averaging 120-140s/60-70s. Sugars - most recent sugars averaging 150-180.    Past Medical History:  Diagnosis Date   Arthritis    knees   Diabetes mellitus (HCC)    GERD (gastroesophageal reflux disease)    Glaucoma    Hypercholesterolemia    Hypertension    Hypothyroidism    Pneumonia    Past Surgical History:  Procedure Laterality Date   CATARACT EXTRACTION W/PHACO Left 11/09/2016   Procedure: CATARACT EXTRACTION PHACO AND INTRAOCULAR LENS PLACEMENT (IOC)  left diabetic complicated;  Surgeon: Dene Etienne, MD;  Location: [facility];  Service: Ophthalmology;  Laterality: Left;  Diabetic - oral meds   CATARACT EXTRACTION W/PHACO Right 03/08/2017   Procedure: CATARACT EXTRACTION PHACO AND INTRAOCULAR LENS PLACEMENT (IOC)  Right Diabetic complicated;  Surgeon: Etienne Dene, MD;  Location: [facility];  Service: Ophthalmology;  Laterality: Right;   EYE SURGERY Bilateral 08/2014, 2.2016   laser surgery in preparation for glaucoma surgery   KNEE ARTHROPLASTY Left 08/06/2018   Procedure: COMPUTER ASSISTED TOTAL KNEE ARTHROPLASTY;  Surgeon: Mardee Lynwood SQUIBB, MD;  Location: [facility];  Service: Orthopedics;  Laterality: Left;   TOTAL HIP ARTHROPLASTY  5/13   right   Family History  Problem Relation Age of Onset   Glaucoma Mother    Osteoporosis Mother    Mental illness Father        suicide   Liver disease Father        alcohol   Glaucoma Father    Alcohol abuse Father    Blindness Father    Diabetes Sister    Stroke Sister    Breast cancer Sister 4   Colon cancer Neg Hx    Social History   Socioeconomic History   Marital status: Divorced    Spouse name: Not on file   Number of children: 1   Years of education: Not on file   Highest education level: 12th grade  Occupational History   Not on file  Tobacco Use   Smoking status: Never   Smokeless tobacco: Never  Vaping Use   Vaping status: Never Used  Substance and Sexual Activity   Alcohol use: No    Alcohol/week: 0.0 standard drinks of alcohol   Drug use: No   Sexual activity: Not on file  Other Topics Concern   Not on file  Social History Narrative   Not on file   Social Drivers of Health  Financial Resource Strain: Low Risk  (05/02/2024)   Overall Financial Resource Strain (CARDIA)    Difficulty of Paying Living Expenses: Not hard at all  Food Insecurity: No Food Insecurity (05/02/2024)   Hunger Vital Sign    Worried About Running Out of Food in the Last Year: Never true    Ran Out of Food in the Last Year: Never true  Transportation Needs: No Transportation Needs (05/02/2024)   PRAPARE - Administrator, Civil Service (Medical): No    Lack of Transportation (Non-Medical): No  Physical Activity: Inactive (05/02/2024)   Exercise Vital Sign    Days of Exercise per Week: 0 days    Minutes of Exercise  per Session: Not on file  Stress: No Stress Concern Present (05/02/2024)   [employer] of Occupational Health - Occupational Stress Questionnaire    Feeling of Stress: Not at all  Social Connections: Moderately Isolated (05/02/2024)   Social Connection and Isolation Panel    Frequency of Communication with Friends and Family: More than three times a week    Frequency of Social Gatherings with Friends and Family: More than three times a week    Attends Religious Services: More than 4 times per year    Active Member of [employer] or Organizations: No    Attends Engineer, structural: Not on file    Marital Status: Divorced     Review of Systems  Constitutional:  Negative for appetite change and unexpected weight change.  HENT:  Negative for congestion and sinus pressure.   Respiratory:  Negative for cough and chest tightness.        Breathing stable.  Cardiovascular:  Negative for chest pain and palpitations.       No increased lower extremity swelling. Stable.   Gastrointestinal:  Negative for abdominal pain, diarrhea, nausea and vomiting.  Musculoskeletal:  Negative for joint swelling and myalgias.  Skin:  Negative for color change and rash.  Neurological:  Negative for dizziness and headaches.  Psychiatric/Behavioral:  Negative for agitation and dysphoric mood.        Objective:     BP 118/70   Pulse 62   Resp 16   Ht 5' (1.524 m)   Wt 181 lb 12.8 oz (82.5 kg)   LMP 08/11/1995   SpO2 98%   BMI 35.51 kg/m  Wt Readings from Last 3 Encounters:  05/06/24 181 lb 12.8 oz (82.5 kg)  04/15/24 190 lb (86.2 kg)  12/18/23 193 lb 3.2 oz (87.6 kg)    Physical Exam Vitals reviewed.  Constitutional:      General: She is not in acute distress.    Appearance: Normal appearance.  HENT:     Head: Normocephalic and atraumatic.     Right Ear: External ear normal.     Left Ear: External ear normal.     Mouth/Throat:     Pharynx: No oropharyngeal exudate or posterior  oropharyngeal erythema.  Eyes:     General: No scleral icterus.       Right eye: No discharge.        Left eye: No discharge.     Conjunctiva/sclera: Conjunctivae normal.  Neck:     Thyroid : No thyromegaly.  Cardiovascular:     Rate and Rhythm: Normal rate and regular rhythm.  Pulmonary:     Effort: No respiratory distress.     Breath sounds: Normal breath sounds. No wheezing.  Abdominal:     General: Bowel sounds are normal.  Palpations: Abdomen is soft.     Tenderness: There is no abdominal tenderness.  Musculoskeletal:        General: No tenderness.     Cervical back: Neck supple. No tenderness.     Comments: No increased swelling - stable.   Lymphadenopathy:     Cervical: No cervical adenopathy.  Skin:    Findings: No erythema or rash.  Neurological:     Mental Status: She is alert.  Psychiatric:        Mood and Affect: Mood normal.        Behavior: Behavior normal.         Outpatient Encounter Medications as of 05/06/2024  Medication Sig   acetaminophen  (TYLENOL ) 650 MG CR tablet Take 650 mg by mouth daily as needed for pain.   amLODipine  (NORVASC ) 5 MG tablet TAKE 1 TABLET(5 MG) BY MOUTH TWICE DAILY   aspirin EC 81 MG tablet Take 81 mg by mouth daily. Swallow whole.   blood glucose meter kit and supplies Dispense based on patient and insurance preference. Use up to four times daily as directed. (FOR ICD-10 E10.9, E11.9).   Cholecalciferol  (VITAMIN D -3) 1000 UNITS CAPS Take 1 capsule by mouth daily.   cloNIDine  (CATAPRES ) 0.1 MG tablet TAKE 1 TABLET(0.1 MG) BY MOUTH THREE TIMES DAILY   glucosamine-chondroitin 500-400 MG tablet Take 1 tablet by mouth daily.   glucose blood (ACCU-CHEK GUIDE TEST) test strip Use as instructed to check blood sugar once daily   glucose blood (CONTOUR NEXT TEST) test strip TEST TWICE DAILY AS DIRECTED   hydrochlorothiazide  (HYDRODIURIL ) 25 MG tablet Take 1 tablet (25 mg total) by mouth daily.   JARDIANCE  25 MG TABS tablet TAKE 1  TABLET(25 MG) BY MOUTH DAILY BEFORE BREAKFAST   Lancets (ONETOUCH ULTRASOFT) lancets Use as instructed to check blood sugars once daily. Dx E11.9   Lancets MISC Use as directed to check blood sugars once daily   levothyroxine  (SYNTHROID ) 88 MCG tablet Take 1 tablet (88 mcg total) by mouth daily.   lisinopril  (ZESTRIL ) 40 MG tablet Take 1 tablet (40 mg total) by mouth daily.   OZEMPIC , 0.25 OR 0.5 MG/DOSE, 2 MG/3ML SOPN INJECT 0.5 MG INTO THE SKIN ONCE A WEEK   ROCKLATAN 0.02-0.005 % SOLN Apply 1 drop to eye at bedtime.   rosuvastatin  (CRESTOR ) 20 MG tablet TAKE 1 TABLET(20 MG) BY MOUTH DAILY   timolol  (TIMOPTIC ) 0.25 % ophthalmic solution Place 1 drop into both eyes 2 (two) times daily.   Turmeric 500 MG CAPS Take 1 capsule by mouth daily.   vitamin C  (ASCORBIC ACID ) 500 MG tablet Take 500 mg by mouth daily.   vitamin E 200 UNIT capsule Take 200 Units by mouth daily.   [DISCONTINUED] levothyroxine  (SYNTHROID ) 100 MCG tablet TAKE 1 TABLET(100 MCG) BY MOUTH DAILY   No facility-administered encounter medications on file as of 05/06/2024.     Lab Results  Component Value Date   WBC 6.7 01/10/2023   HGB 13.4 01/10/2023   HCT 39.6 01/10/2023   PLT 257.0 01/10/2023   GLUCOSE 137 (H) 05/03/2024   CHOL 132 05/03/2024   TRIG 166.0 (H) 05/03/2024   HDL 43.70 05/03/2024   LDLDIRECT 80.0 04/28/2022   LDLCALC 55 05/03/2024   ALT 11 05/03/2024   AST 15 05/03/2024   NA 140 05/03/2024   K 4.7 05/03/2024   CL 97 05/03/2024   CREATININE 1.21 (H) 05/03/2024   BUN 26 (H) 05/03/2024   CO2 35 (H) 05/03/2024  TSH 0.16 (L) 05/03/2024   INR 1.00 07/24/2018   HGBA1C 8.1 (H) 05/03/2024       Assessment & Plan:  Encounter for screening mammogram for malignant neoplasm of breast -     3D Screening Mammogram, Left and Right; Future  Stage 3b chronic kidney disease (HCC) Assessment & Plan: Continue to avoid antiinflammatories.  Continue ace inhibitor.  Dose recently increased. On hydrochlorothiazide .   Recently increased to one whole tablet per day by cardiology.  Continue f/u with nephrology.  Follow metabolic panel. GFR just checked 05/03/24 - 41.5.     Type 2 diabetes mellitus with hyperglycemia, without long-term current use of insulin  [facility]) Assessment & Plan: Continues on jardiance . On ozempic . Tolerating. Reviewed outside sugar readings - most averaging 150-180s. Continue low carb diet and exercise. Follow met b and A1c.  Lab Results  Component Value Date   HGBA1C 8.1 (H) 05/03/2024      Hyperlipidemia associated with type 2 diabetes mellitus (HCC) Assessment & Plan: Continue crestor . Low cholesterol diet and exercise. Follow lipid panel.  Lab Results  Component Value Date   CHOL 132 05/03/2024   HDL 43.70 05/03/2024   LDLCALC 55 05/03/2024   LDLDIRECT 80.0 04/28/2022   TRIG 166.0 (H) 05/03/2024   CHOLHDL 3 05/03/2024      Primary hypertension Assessment & Plan: Continues on amlodipine , clonidine  and now on lisinopril  40mg  q day.  Back on hydrochlorothiazide  and now taking one whole tablet per day.  Renal artery doppler ordered by cardiology - at least moderate left renal artery stenosis.  Right kidney poorly visualized. Continues on jardiance .  Follow pressures.  Blood pressure overall doing better.blood pressure recheck today 126/68. Follow metabolic panel.    Hypothyroidism, unspecified type Assessment & Plan: Recent tsh check - suppressed. Stop 100mcg synthroid . Start 88mcg synthroid . Recheck tsh in 6 weeks.   Orders: -     TSH; Future  Mild obstructive sleep apnea Assessment & Plan: Continue f/u with pulmonary. Continue cpap.    Stress Assessment & Plan: Overall appears to be doing well. Follow.    Other orders -     Levothyroxine  Sodium; Take 1 tablet (88 mcg total) by mouth daily.  Dispense: 90 tablet; Refill: 1     Allena Hamilton, MD

## 2024-05-06 NOTE — Patient Instructions (Signed)
 Stop synthroid  100mcg   Start synthroid  - one per day.

## 2024-05-07 ENCOUNTER — Telehealth: Payer: Self-pay

## 2024-05-07 NOTE — Telephone Encounter (Signed)
 Noted on chart,.

## 2024-05-07 NOTE — Telephone Encounter (Signed)
 Copied from CRM [id number]. Topic: General - Other >> May 07, 2024  9:37 AM Berneda FALCON wrote: Reason for CRM: Pt would like the nurse and PCP to know that she got the flu shot yesteday (9/8) at [facility].

## 2024-05-10 DIAGNOSIS — H401112 Primary open-angle glaucoma, right eye, moderate stage: Secondary | ICD-10-CM | POA: Diagnosis not present

## 2024-05-10 DIAGNOSIS — H401121 Primary open-angle glaucoma, left eye, mild stage: Secondary | ICD-10-CM | POA: Diagnosis not present

## 2024-05-12 ENCOUNTER — Encounter: Payer: Self-pay | Admitting: Internal Medicine

## 2024-05-12 NOTE — Assessment & Plan Note (Signed)
 Overall appears to be doing well.  Follow.

## 2024-05-12 NOTE — Assessment & Plan Note (Signed)
 Continues on amlodipine , clonidine  and now on lisinopril  40mg  q day.  Back on hydrochlorothiazide  and now taking one whole tablet per day.  Renal artery doppler ordered by cardiology - at least moderate left renal artery stenosis.  Right kidney poorly visualized. Continues on jardiance .  Follow pressures.  Blood pressure overall doing better.blood pressure recheck today 126/68. Follow metabolic panel.

## 2024-05-12 NOTE — Assessment & Plan Note (Signed)
 Continue crestor . Low cholesterol diet and exercise. Follow lipid panel.  Lab Results  Component Value Date   CHOL 132 05/03/2024   HDL 43.70 05/03/2024   LDLCALC 55 05/03/2024   LDLDIRECT 80.0 04/28/2022   TRIG 166.0 (H) 05/03/2024   CHOLHDL 3 05/03/2024

## 2024-05-12 NOTE — Assessment & Plan Note (Signed)
 Continues on jardiance . On ozempic . Tolerating. Reviewed outside sugar readings - most averaging 150-180s. Continue low carb diet and exercise. Follow met b and A1c.  Lab Results  Component Value Date   HGBA1C 8.1 (H) 05/03/2024

## 2024-05-12 NOTE — Assessment & Plan Note (Signed)
 Continue f/u with pulmonary. Continue cpap.

## 2024-05-12 NOTE — Assessment & Plan Note (Signed)
 Continue to avoid antiinflammatories.  Continue ace inhibitor.  Dose recently increased. On hydrochlorothiazide .  Recently increased to one whole tablet per day by cardiology.  Continue f/u with nephrology.  Follow metabolic panel. GFR just checked 05/03/24 - 41.5.

## 2024-05-12 NOTE — Assessment & Plan Note (Signed)
 Recent tsh check - suppressed. Stop 100mcg synthroid . Start 88mcg synthroid . Recheck tsh in 6 weeks.

## 2024-05-15 ENCOUNTER — Ambulatory Visit: Admitting: Internal Medicine

## 2024-05-17 DIAGNOSIS — Z961 Presence of intraocular lens: Secondary | ICD-10-CM | POA: Diagnosis not present

## 2024-05-17 DIAGNOSIS — E113299 Type 2 diabetes mellitus with mild nonproliferative diabetic retinopathy without macular edema, unspecified eye: Secondary | ICD-10-CM | POA: Diagnosis not present

## 2024-05-17 DIAGNOSIS — H401112 Primary open-angle glaucoma, right eye, moderate stage: Secondary | ICD-10-CM | POA: Diagnosis not present

## 2024-05-17 DIAGNOSIS — H401121 Primary open-angle glaucoma, left eye, mild stage: Secondary | ICD-10-CM | POA: Diagnosis not present

## 2024-05-17 LAB — HM DIABETES EYE EXAM

## 2024-05-20 DIAGNOSIS — N1832 Chronic kidney disease, stage 3b: Secondary | ICD-10-CM | POA: Diagnosis not present

## 2024-05-20 DIAGNOSIS — N189 Chronic kidney disease, unspecified: Secondary | ICD-10-CM | POA: Diagnosis not present

## 2024-05-20 DIAGNOSIS — E1122 Type 2 diabetes mellitus with diabetic chronic kidney disease: Secondary | ICD-10-CM | POA: Diagnosis not present

## 2024-05-20 DIAGNOSIS — E1129 Type 2 diabetes mellitus with other diabetic kidney complication: Secondary | ICD-10-CM | POA: Diagnosis not present

## 2024-05-21 ENCOUNTER — Telehealth: Payer: Self-pay

## 2024-05-21 NOTE — Telephone Encounter (Signed)
 Copied from CRM [id number]. Topic: General - Other >> May 21, 2024  3:29 PM Thersia C wrote: Reason for CRM: Patient called in regarding a question for Dr.Scott or nurse, patient stated she took some paperwork to be filled out to go to the [place] activity center, wanted to know if she has filled that out and sent it to them  Would like a callback

## 2024-05-21 NOTE — Telephone Encounter (Signed)
 See me about this in the morning.

## 2024-05-22 NOTE — Telephone Encounter (Signed)
 Formed signed and in box.

## 2024-05-22 NOTE — Telephone Encounter (Signed)
 Form faxed. Patient is aware.

## 2024-05-27 DIAGNOSIS — N1831 Chronic kidney disease, stage 3a: Secondary | ICD-10-CM | POA: Diagnosis not present

## 2024-05-27 DIAGNOSIS — R809 Proteinuria, unspecified: Secondary | ICD-10-CM | POA: Diagnosis not present

## 2024-05-27 DIAGNOSIS — N189 Chronic kidney disease, unspecified: Secondary | ICD-10-CM | POA: Diagnosis not present

## 2024-05-27 DIAGNOSIS — I10 Essential (primary) hypertension: Secondary | ICD-10-CM | POA: Diagnosis not present

## 2024-05-29 ENCOUNTER — Encounter: Payer: Self-pay | Admitting: Internal Medicine

## 2024-05-29 ENCOUNTER — Ambulatory Visit: Attending: Internal Medicine | Admitting: Internal Medicine

## 2024-05-29 VITALS — BP 128/60 | HR 53 | Ht 60.0 in | Wt 182.2 lb

## 2024-05-29 DIAGNOSIS — R001 Bradycardia, unspecified: Secondary | ICD-10-CM | POA: Diagnosis not present

## 2024-05-29 DIAGNOSIS — I1A0 Resistant hypertension: Secondary | ICD-10-CM

## 2024-05-29 DIAGNOSIS — N1832 Chronic kidney disease, stage 3b: Secondary | ICD-10-CM

## 2024-05-29 DIAGNOSIS — I701 Atherosclerosis of renal artery: Secondary | ICD-10-CM

## 2024-05-29 DIAGNOSIS — I272 Pulmonary hypertension, unspecified: Secondary | ICD-10-CM | POA: Diagnosis not present

## 2024-05-29 NOTE — Progress Notes (Signed)
 Cardiology Office Note:  .   Date:  05/29/2024  ID:  Lorraine Foley, DOB Dec 01, 1940, MRN [id number] PCP: Glendia Shad, MD  [facility] Providers Cardiologist:  Lonni Hanson, MD Electrophysiologist:  OLE ONEIDA HOLTS, MD     History of Present Illness: .   Lorraine Foley is a 83 y.o. female with history of left renal artery stenosis, moderate pulmonary hypertension by echo, hypertension, hyperlipidemia, and type 2 diabetes mellitus, who presents for follow-up of hypertension and bradycardia.  I last saw her in 11/2023, at which time she reported mild exertional dyspnea with strenuous activities but was otherwise feeling well.  Home blood pressure readings had improved with reduction in sodium intake.  We discussed performing MRA to better understand her left renal artery stenosis but agreed to defer this as she was planning to travel back to [place] for the summer.  Today, Lorraine Foley reports that she has been feeling very well, denying chest pain, shortness of breath, palpitations, lightheadedness, and edema.  Her home blood pressures have been fairly stable, with systolic readings usually in the 130s or 140s.  She is tolerating her medications well.  She hopes to begin exercising at the activity center where she stays soon.  She is also traveling back to [place]  state later this month to spend 6 weeks with her family.  ROS: See HPI  Studies Reviewed: SABRA   EKG Interpretation Date/Time:  Wednesday May 29 2024 11:25:41 EDT Ventricular Rate:  53 PR Interval:  192 QRS Duration:  86 QT Interval:  440 QTC Calculation: 412 R Axis:   -17  Text Interpretation: Sinus bradycardia Minimal voltage criteria for LVH, may be normal variant ( R in aVL ) Anterolateral infarct (cited on or before 03-May-2023) Nonspecific T wave abnormality Abnormal ECG When compared with ECG of 13-Dec-2023 09:21, Premature supraventricular complexes are no longer Present Confirmed by Glynna Failla, Lonni [phone]) on  05/29/2024 1:15:28 PM    Risk Assessment/Calculations:             Physical Exam:   VS:  BP 128/60 (BP Location: Left Arm, Patient Position: Sitting, Cuff Size: Large)   Pulse (!) 53   Ht 5' (1.524 m)   Wt 182 lb 4 oz (82.7 kg)   LMP 08/11/1995   SpO2 97%   BMI 35.59 kg/m    Wt Readings from Last 3 Encounters:  05/29/24 182 lb 4 oz (82.7 kg)  05/06/24 181 lb 12.8 oz (82.5 kg)  04/15/24 190 lb (86.2 kg)    General:  NAD. Neck: No JVD or HJR. Lungs: Clear to auscultation bilaterally without wheezes or crackles. Heart: Regular rate and rhythm without murmurs, rubs, or gallops. Abdomen: Soft, nontender, nondistended. Extremities: No lower extremity edema.  ASSESSMENT AND PLAN: .    Resistant hypertension and renal artery stenosis: Blood pressure is well-controlled today, sometimes slightly higher at home.  She remains compliant with her regimen consisting of amlodipine , clonidine , HCTZ, and lisinopril .  We discussed moving forward with MRA to better understand her renal artery disease, but Lorraine Foley wishes to defer this given her upcoming trip, lack of symptoms, and improved blood pressure control.  Recent labs through Dr. Glendia and Dr. Dennise show stable renal function and electrolytes.  Continue aspirin and statin therapy in the setting of presumed atherosclerotic renal artery stenosis.  Pulmonary hypertension: Moderate pH noted by echo last year.  There are no signs or symptoms of heart failure.  Defer additional testing and medication changes at this  time.  Chronic kidney disease stage 3b: Renal function stable.  Continue current medications with avoidance of nephrotoxic agents.  If renal function were to worsen, further evaluation of her renal artery stenosis will need to be pursued.  Sinus bradycardia: Asymptomatic bradycardia again noted.  Continue avoidance of rate controlling agents; may need to consider discontinuing clonidine  if bradycardia worsens or symptoms develop.  No  further testing recommended at this time.    Dispo: Return to clinic in 6 months.  Signed, Lonni Hanson, MD

## 2024-05-29 NOTE — Patient Instructions (Signed)

## 2024-06-10 ENCOUNTER — Other Ambulatory Visit: Payer: Self-pay | Admitting: Internal Medicine

## 2024-06-15 ENCOUNTER — Other Ambulatory Visit: Payer: Self-pay | Admitting: Internal Medicine

## 2024-06-17 ENCOUNTER — Other Ambulatory Visit (INDEPENDENT_AMBULATORY_CARE_PROVIDER_SITE_OTHER)

## 2024-06-17 DIAGNOSIS — E039 Hypothyroidism, unspecified: Secondary | ICD-10-CM

## 2024-06-17 LAB — TSH: TSH: 1.4 u[IU]/mL (ref 0.35–5.50)

## 2024-06-18 ENCOUNTER — Ambulatory Visit: Payer: Self-pay | Admitting: Internal Medicine

## 2024-06-21 ENCOUNTER — Encounter

## 2024-06-24 ENCOUNTER — Other Ambulatory Visit: Payer: Self-pay | Admitting: Internal Medicine

## 2024-08-15 ENCOUNTER — Inpatient Hospital Stay: Admission: RE | Admit: 2024-08-15 | Discharge: 2024-08-15 | Attending: Internal Medicine | Admitting: Internal Medicine

## 2024-08-15 DIAGNOSIS — Z1231 Encounter for screening mammogram for malignant neoplasm of breast: Secondary | ICD-10-CM | POA: Diagnosis present

## 2024-08-23 ENCOUNTER — Other Ambulatory Visit: Payer: Self-pay | Admitting: *Deleted

## 2024-08-23 DIAGNOSIS — E1165 Type 2 diabetes mellitus with hyperglycemia: Secondary | ICD-10-CM

## 2024-08-23 DIAGNOSIS — E7800 Pure hypercholesterolemia, unspecified: Secondary | ICD-10-CM

## 2024-08-23 DIAGNOSIS — E039 Hypothyroidism, unspecified: Secondary | ICD-10-CM

## 2024-08-23 NOTE — Addendum Note (Signed)
 Addended by: BRIEN SHARENE RAMAN on: 08/23/2024 03:59 PM   Modules accepted: Orders

## 2024-08-23 NOTE — Addendum Note (Signed)
 Addended by: GLENDIA ALLENA RAMAN on: 08/23/2024 07:41 PM   Modules accepted: Orders

## 2024-08-23 NOTE — Progress Notes (Signed)
Orders signed for future labs

## 2024-09-02 ENCOUNTER — Ambulatory Visit: Payer: Self-pay | Admitting: Internal Medicine

## 2024-09-02 ENCOUNTER — Other Ambulatory Visit (INDEPENDENT_AMBULATORY_CARE_PROVIDER_SITE_OTHER)

## 2024-09-02 DIAGNOSIS — E039 Hypothyroidism, unspecified: Secondary | ICD-10-CM

## 2024-09-02 DIAGNOSIS — E7800 Pure hypercholesterolemia, unspecified: Secondary | ICD-10-CM

## 2024-09-02 DIAGNOSIS — E1165 Type 2 diabetes mellitus with hyperglycemia: Secondary | ICD-10-CM | POA: Diagnosis not present

## 2024-09-02 LAB — HEMOGLOBIN A1C: Hgb A1c MFr Bld: 9.9 % — ABNORMAL HIGH (ref 4.6–6.5)

## 2024-09-02 LAB — BASIC METABOLIC PANEL WITH GFR
BUN: 35 mg/dL — ABNORMAL HIGH (ref 6–23)
CO2: 34 meq/L — ABNORMAL HIGH (ref 19–32)
Calcium: 9.5 mg/dL (ref 8.4–10.5)
Chloride: 97 meq/L (ref 96–112)
Creatinine, Ser: 1.13 mg/dL (ref 0.40–1.20)
GFR: 44.95 mL/min — ABNORMAL LOW
Glucose, Bld: 261 mg/dL — ABNORMAL HIGH (ref 70–99)
Potassium: 4.1 meq/L (ref 3.5–5.1)
Sodium: 138 meq/L (ref 135–145)

## 2024-09-02 LAB — HEPATIC FUNCTION PANEL
ALT: 15 U/L (ref 3–35)
AST: 13 U/L (ref 5–37)
Albumin: 4.3 g/dL (ref 3.5–5.2)
Alkaline Phosphatase: 65 U/L (ref 39–117)
Bilirubin, Direct: 0.1 mg/dL (ref 0.1–0.3)
Total Bilirubin: 0.7 mg/dL (ref 0.2–1.2)
Total Protein: 7.1 g/dL (ref 6.0–8.3)

## 2024-09-02 LAB — LIPID PANEL
Cholesterol: 144 mg/dL (ref 28–200)
HDL: 51.7 mg/dL
LDL Cholesterol: 50 mg/dL (ref 10–99)
NonHDL: 92.06
Total CHOL/HDL Ratio: 3
Triglycerides: 209 mg/dL — ABNORMAL HIGH (ref 10.0–149.0)
VLDL: 41.8 mg/dL — ABNORMAL HIGH (ref 0.0–40.0)

## 2024-09-02 LAB — TSH: TSH: 1.87 u[IU]/mL (ref 0.35–5.50)

## 2024-09-03 ENCOUNTER — Other Ambulatory Visit

## 2024-09-04 ENCOUNTER — Ambulatory Visit: Admitting: Internal Medicine

## 2024-09-04 ENCOUNTER — Encounter: Payer: Self-pay | Admitting: Internal Medicine

## 2024-09-04 VITALS — BP 126/60 | HR 54 | Temp 98.3°F | Ht 60.0 in | Wt 184.0 lb

## 2024-09-04 DIAGNOSIS — G4733 Obstructive sleep apnea (adult) (pediatric): Secondary | ICD-10-CM

## 2024-09-04 DIAGNOSIS — E559 Vitamin D deficiency, unspecified: Secondary | ICD-10-CM | POA: Diagnosis not present

## 2024-09-04 DIAGNOSIS — Z0000 Encounter for general adult medical examination without abnormal findings: Secondary | ICD-10-CM | POA: Diagnosis not present

## 2024-09-04 DIAGNOSIS — E785 Hyperlipidemia, unspecified: Secondary | ICD-10-CM

## 2024-09-04 DIAGNOSIS — Z7984 Long term (current) use of oral hypoglycemic drugs: Secondary | ICD-10-CM

## 2024-09-04 DIAGNOSIS — I10 Essential (primary) hypertension: Secondary | ICD-10-CM

## 2024-09-04 DIAGNOSIS — F439 Reaction to severe stress, unspecified: Secondary | ICD-10-CM | POA: Diagnosis not present

## 2024-09-04 DIAGNOSIS — E7800 Pure hypercholesterolemia, unspecified: Secondary | ICD-10-CM

## 2024-09-04 DIAGNOSIS — E039 Hypothyroidism, unspecified: Secondary | ICD-10-CM

## 2024-09-04 DIAGNOSIS — E1169 Type 2 diabetes mellitus with other specified complication: Secondary | ICD-10-CM

## 2024-09-04 DIAGNOSIS — N1832 Chronic kidney disease, stage 3b: Secondary | ICD-10-CM

## 2024-09-04 DIAGNOSIS — E1165 Type 2 diabetes mellitus with hyperglycemia: Secondary | ICD-10-CM | POA: Diagnosis not present

## 2024-09-04 LAB — HM DIABETES FOOT EXAM

## 2024-09-04 MED ORDER — EMPAGLIFLOZIN 25 MG PO TABS: 25.0000 mg | ORAL_TABLET | Freq: Every day | ORAL | 1 refills | Status: AC

## 2024-09-04 MED ORDER — OZEMPIC (0.25 OR 0.5 MG/DOSE) 2 MG/3ML ~~LOC~~ SOPN: 0.2500 mg | PEN_INJECTOR | SUBCUTANEOUS | 2 refills | Status: AC

## 2024-09-04 MED ORDER — LEVOTHYROXINE SODIUM 88 MCG PO TABS: 88.0000 ug | ORAL_TABLET | Freq: Every day | ORAL | 1 refills | Status: AC

## 2024-09-04 NOTE — Assessment & Plan Note (Signed)
 Physical today 09/04/24.  Mammogram 08/15/24 - birads I.  colonosocpy 06/2013.

## 2024-09-04 NOTE — Progress Notes (Signed)
 "  Subjective:    Patient ID: Lorraine Foley, female    DOB: July 27, 1941, 84 y.o.   MRN: [id number]  Patient here for  Chief Complaint  Patient presents with   Medical Management of Chronic Issues   Annual Exam    HPI Here for a physical exam. Saw Dr End 05/29/24 - f/u moderate pulmonary hypertension. Recommended no further testing at that time. Continue aspirin and statin therapy.  Had f/u with Dr Dennise 05/27/24 - stable. No changes. Continues on cpap. Has been in [place]  - helping her daughter. Back home now for a few months. Has been off semaglutide  since halloween. States she could not get her prescription refilled in [place] . Discussed recent elevation in blood sugar - A1c 9.9. discussed diet and exercise. She had no problems with the medication and is agreeable to restart. Agreeable to pharmacy referral and meeting with Manuelita. Overall doing relatively well. No chest pain. No sob. Breathing stable. No abdominal pain.    Past Medical History:  Diagnosis Date   Arthritis    knees   Diabetes mellitus (HCC)    GERD (gastroesophageal reflux disease)    Glaucoma    Hypercholesterolemia    Hypertension    Hypothyroidism    Pneumonia    Past Surgical History:  Procedure Laterality Date   CATARACT EXTRACTION W/PHACO Left 11/09/2016   Procedure: CATARACT EXTRACTION PHACO AND INTRAOCULAR LENS PLACEMENT (IOC)  left diabetic complicated;  Surgeon: Dene Etienne, MD;  Location: [facility];  Service: Ophthalmology;  Laterality: Left;  Diabetic - oral meds   CATARACT EXTRACTION W/PHACO Right 03/08/2017   Procedure: CATARACT EXTRACTION PHACO AND INTRAOCULAR LENS PLACEMENT (IOC)  Right Diabetic complicated;  Surgeon: Etienne Dene, MD;  Location: [facility];  Service: Ophthalmology;  Laterality: Right;   EYE SURGERY Bilateral 08/2014, 2.2016   laser surgery in preparation for glaucoma surgery   KNEE ARTHROPLASTY Left 08/06/2018   Procedure: COMPUTER ASSISTED TOTAL  KNEE ARTHROPLASTY;  Surgeon: Mardee Lynwood SQUIBB, MD;  Location: [facility];  Service: Orthopedics;  Laterality: Left;   TOTAL HIP ARTHROPLASTY  5/13   right   Family History  Problem Relation Age of Onset   Glaucoma Mother    Osteoporosis Mother    Mental illness Father        suicide   Liver disease Father        alcohol   Glaucoma Father    Alcohol abuse Father    Blindness Father    Diabetes Sister    Stroke Sister    Breast cancer Sister 73   Colon cancer Neg Hx    Social History   Socioeconomic History   Marital status: Divorced    Spouse name: Not on file   Number of children: 1   Years of education: Not on file   Highest education level: 12th grade  Occupational History   Not on file  Tobacco Use   Smoking status: Never   Smokeless tobacco: Never  Vaping Use   Vaping status: Never Used  Substance and Sexual Activity   Alcohol use: Never   Drug use: Never   Sexual activity: Not Currently  Other Topics Concern   Not on file  Social History Narrative   Not on file   Social Drivers of Health   Tobacco Use: Low Risk (09/08/2024)   Patient History    Smoking Tobacco Use: Never    Smokeless Tobacco Use: Never    Passive Exposure: Not on file  Financial Resource Strain: Low Risk (09/02/2024)   Overall Financial Resource Strain (CARDIA)    Difficulty of Paying Living Expenses: Not very hard  Food Insecurity: No Food Insecurity (09/02/2024)   Epic    Worried About Programme Researcher, Broadcasting/film/video in the Last Year: Never true    Ran Out of Food in the Last Year: Never true  Transportation Needs: No Transportation Needs (09/02/2024)   Epic    Lack of Transportation (Medical): No    Lack of Transportation (Non-Medical): No  Physical Activity: Insufficiently Active (09/02/2024)   Exercise Vital Sign    Days of Exercise per Week: 5 days    Minutes of Exercise per Session: 10 min  Stress: No Stress Concern Present (09/02/2024)   [employer] of Occupational Health - Occupational  Stress Questionnaire    Feeling of Stress: Not at all  Social Connections: Moderately Integrated (09/02/2024)   Social Connection and Isolation Panel    Frequency of Communication with Friends and Family: More than three times a week    Frequency of Social Gatherings with Friends and Family: Once a week    Attends Religious Services: More than 4 times per year    Active Member of Clubs or Organizations: Yes    Attends Banker Meetings: More than 4 times per year    Marital Status: Divorced  Depression (PHQ2-9): Low Risk (09/04/2024)   Depression (PHQ2-9)    PHQ-2 Score: 0  Alcohol Screen: Low Risk (04/15/2024)   Alcohol Screen    Last Alcohol Screening Score (AUDIT): 0  Housing: Unknown (09/02/2024)   Epic    Unable to Pay for Housing in the Last Year: No    Number of Times Moved in the Last Year: Not on file    Homeless in the Last Year: No  Utilities: Not At Risk (04/15/2024)   Epic    Threatened with loss of utilities: No  Health Literacy: Adequate Health Literacy (04/15/2024)   B1300 Health Literacy    Frequency of need for help with medical instructions: Never     Review of Systems  Constitutional:  Negative for appetite change and unexpected weight change.  HENT:  Negative for congestion, sinus pressure and sore throat.   Eyes:  Negative for pain and visual disturbance.  Respiratory:  Negative for cough, chest tightness and shortness of breath.   Cardiovascular:  Negative for chest pain and palpitations.       No increased swelling.   Gastrointestinal:  Negative for abdominal pain, diarrhea, nausea and vomiting.  Genitourinary:  Negative for difficulty urinating and dysuria.  Musculoskeletal:  Negative for joint swelling and myalgias.  Skin:  Negative for color change and rash.  Neurological:  Negative for dizziness and headaches.  Hematological:  Negative for adenopathy. Does not bruise/bleed easily.  Psychiatric/Behavioral:  Negative for agitation and dysphoric  mood.        Objective:     BP 126/60   Pulse (!) 54   Temp 98.3 F (36.8 C) (Oral)   Ht 5' (1.524 m)   Wt 184 lb (83.5 kg)   LMP 08/11/1995   SpO2 97%   BMI 35.94 kg/m  Wt Readings from Last 3 Encounters:  09/04/24 184 lb (83.5 kg)  05/29/24 182 lb 4 oz (82.7 kg)  05/06/24 181 lb 12.8 oz (82.5 kg)    Physical Exam Vitals reviewed.  Constitutional:      General: She is not in acute distress.    Appearance: Normal appearance. She is  well-developed.  HENT:     Head: Normocephalic and atraumatic.     Right Ear: External ear normal.     Left Ear: External ear normal.     Mouth/Throat:     Pharynx: No oropharyngeal exudate or posterior oropharyngeal erythema.  Eyes:     General: No scleral icterus.       Right eye: No discharge.        Left eye: No discharge.     Conjunctiva/sclera: Conjunctivae normal.  Neck:     Thyroid : No thyromegaly.  Cardiovascular:     Rate and Rhythm: Normal rate and regular rhythm.  Pulmonary:     Effort: No tachypnea, accessory muscle usage or respiratory distress.     Breath sounds: Normal breath sounds. No decreased breath sounds or wheezing.  Chest:  Breasts:    Right: No inverted nipple, mass, nipple discharge or tenderness (no axillary adenopathy).     Left: No inverted nipple, mass, nipple discharge or tenderness (no axilarry adenopathy).  Abdominal:     General: Bowel sounds are normal.     Palpations: Abdomen is soft.     Tenderness: There is no abdominal tenderness.  Musculoskeletal:        General: No swelling or tenderness.     Cervical back: Neck supple.  Lymphadenopathy:     Cervical: No cervical adenopathy.  Skin:    Findings: No erythema or rash.  Neurological:     Mental Status: She is alert and oriented to person, place, and time.  Psychiatric:        Mood and Affect: Mood normal.        Behavior: Behavior normal.         Outpatient Encounter Medications as of 09/04/2024  Medication Sig   acetaminophen   (TYLENOL ) 650 MG CR tablet Take 650 mg by mouth daily as needed for pain.   amLODipine  (NORVASC ) 5 MG tablet TAKE 1 TABLET(5 MG) BY MOUTH TWICE DAILY   aspirin EC 81 MG tablet Take 81 mg by mouth daily. Swallow whole.   blood glucose meter kit and supplies Dispense based on patient and insurance preference. Use up to four times daily as directed. (FOR ICD-10 E10.9, E11.9).   Cholecalciferol  (VITAMIN D -3) 1000 UNITS CAPS Take 1 capsule by mouth daily.   cloNIDine  (CATAPRES ) 0.1 MG tablet TAKE 1 TABLET(0.1 MG) BY MOUTH THREE TIMES DAILY   CONTOUR NEXT TEST test strip TEST TWICE DAILY AS DIRECTED   glucosamine-chondroitin 500-400 MG tablet Take 1 tablet by mouth daily.   glucose blood (ACCU-CHEK GUIDE TEST) test strip Use as instructed to check blood sugar once daily   hydrochlorothiazide  (HYDRODIURIL ) 25 MG tablet TAKE 1 TABLET(25 MG) BY MOUTH DAILY   Lancets (ONETOUCH ULTRASOFT) lancets Use as instructed to check blood sugars once daily. Dx E11.9   Lancets MISC Use as directed to check blood sugars once daily   lisinopril  (ZESTRIL ) 40 MG tablet Take 1 tablet (40 mg total) by mouth daily.   ROCKLATAN 0.02-0.005 % SOLN Apply 1 drop to eye at bedtime.   rosuvastatin  (CRESTOR ) 20 MG tablet TAKE 1 TABLET(20 MG) BY MOUTH DAILY   timolol  (TIMOPTIC ) 0.25 % ophthalmic solution Place 1 drop into both eyes 2 (two) times daily.   Turmeric 500 MG CAPS Take 1 capsule by mouth daily.   vitamin C  (ASCORBIC ACID ) 500 MG tablet Take 500 mg by mouth daily. (Patient taking differently: Take 500 mg by mouth daily as needed.)   vitamin E 200 UNIT capsule  Take 200 Units by mouth daily.   empagliflozin  (JARDIANCE ) 25 MG TABS tablet Take 1 tablet (25 mg total) by mouth daily.   levothyroxine  (SYNTHROID ) 88 MCG tablet Take 1 tablet (88 mcg total) by mouth daily.   Semaglutide ,0.25 or 0.5MG /DOS, (OZEMPIC , 0.25 OR 0.5 MG/DOSE,) 2 MG/3ML SOPN Inject 0.25 mg into the skin once a week.   [DISCONTINUED] JARDIANCE  25 MG TABS  tablet TAKE 1 TABLET(25 MG) BY MOUTH DAILY BEFORE BREAKFAST   [DISCONTINUED] levothyroxine  (SYNTHROID ) 88 MCG tablet Take 1 tablet (88 mcg total) by mouth daily.   [DISCONTINUED] OZEMPIC , 0.25 OR 0.5 MG/DOSE, 2 MG/3ML SOPN INJECT 0.5 MG INTO THE SKIN ONCE A WEEK (Patient not taking: Reported on 09/04/2024)   No facility-administered encounter medications on file as of 09/04/2024.     Lab Results  Component Value Date   WBC 6.7 01/10/2023   HGB 13.4 01/10/2023   HCT 39.6 01/10/2023   PLT 257.0 01/10/2023   GLUCOSE 261 (H) 09/02/2024   CHOL 144 09/02/2024   TRIG 209.0 (H) 09/02/2024   HDL 51.70 09/02/2024   LDLDIRECT 80.0 04/28/2022   LDLCALC 50 09/02/2024   ALT 15 09/02/2024   AST 13 09/02/2024   NA 138 09/02/2024   K 4.1 09/02/2024   CL 97 09/02/2024   CREATININE 1.13 09/02/2024   BUN 35 (H) 09/02/2024   CO2 34 (H) 09/02/2024   TSH 1.87 09/02/2024   INR 1.00 07/24/2018   HGBA1C 9.9 (H) 09/02/2024    MM 3D SCREENING MAMMOGRAM BILATERAL BREAST Result Date: 08/19/2024 CLINICAL DATA:  Screening. EXAM: DIGITAL SCREENING BILATERAL MAMMOGRAM WITH TOMOSYNTHESIS AND CAD TECHNIQUE: Bilateral screening digital craniocaudal and mediolateral oblique mammograms were obtained. Bilateral screening digital breast tomosynthesis was performed. The images were evaluated with computer-aided detection. COMPARISON:  Previous exam(s). ACR Breast Density Category b: There are scattered areas of fibroglandular density. FINDINGS: There are no findings suspicious for malignancy. IMPRESSION: No mammographic evidence of malignancy. A result letter of this screening mammogram will be mailed directly to the patient. RECOMMENDATION: Screening mammogram in one year. (Code:[id number]) BI-RADS CATEGORY  1: Negative. Electronically Signed   By: Dina  Arceo M.D.   On: 08/19/2024 20:04       Assessment & Plan:  Routine general medical examination at a health care facility  Hypothyroidism, unspecified type Assessment &  Plan: Continue synthroid . Follow tsh.    Hypercholesterolemia -     Lipid panel; Future -     Hepatic function panel; Future -     Basic metabolic panel with GFR; Future -     CBC with Differential/Platelet; Future  Type 2 diabetes mellitus with hyperglycemia, without long-term current use of insulin  (HCC) -     Hemoglobin A1c; Future -     AMB Referral VBCI Care Management  Healthcare maintenance Assessment & Plan: Physical today 09/04/24.  Mammogram 08/15/24 - birads I.  colonosocpy 06/2013.    Vitamin D  deficiency Assessment & Plan: Check vitamin D  level with next labs.    Stress Assessment & Plan: Overall appears to be doing well. Follow.    Mild obstructive sleep apnea Assessment & Plan: CPAP.    Primary hypertension Assessment & Plan: Continues on amlodipine , clonidine  and now on lisinopril  40mg  q day.  Back on hydrochlorothiazide  and now taking one whole tablet per day.  Renal artery doppler ordered by cardiology - at least moderate left renal artery stenosis.  Right kidney poorly visualized. Continues on jardiance .  Follow pressures.  Blood pressure overall doing better. Hold  on making changes in medication. GFR 44.95. follow metabolic panel.    Hyperlipidemia associated with type 2 diabetes mellitus (HCC) Assessment & Plan: Continue crestor . Low cholesterol diet and exercise. Follow lipid panel.  Lab Results  Component Value Date   CHOL 144 09/02/2024   HDL 51.70 09/02/2024   LDLCALC 50 09/02/2024   LDLDIRECT 80.0 04/28/2022   TRIG 209.0 (H) 09/02/2024   CHOLHDL 3 09/02/2024      Stage 3b chronic kidney disease (HCC) Assessment & Plan: Continue to avoid antiinflammatories.  Continue ace inhibitor.  Dose recently increased. On hydrochlorothiazide .  Recently increased to one whole tablet per day by cardiology.  Continue f/u with nephrology.  Follow metabolic panel. GFR just checked  09/03/23 - 44.95.    Other orders -     Empagliflozin ; Take 1 tablet (25  mg total) by mouth daily.  Dispense: 90 tablet; Refill: 1 -     Levothyroxine  Sodium; Take 1 tablet (88 mcg total) by mouth daily.  Dispense: 90 tablet; Refill: 1 -     Ozempic  (0.25 or 0.5 MG/DOSE); Inject 0.25 mg into the skin once a week.  Dispense: 3 mL; Refill: 2     Allena Hamilton, MD "

## 2024-09-06 ENCOUNTER — Encounter: Admitting: Internal Medicine

## 2024-09-08 ENCOUNTER — Encounter: Payer: Self-pay | Admitting: Internal Medicine

## 2024-09-08 NOTE — Assessment & Plan Note (Signed)
 Continue crestor . Low cholesterol diet and exercise. Follow lipid panel.  Lab Results  Component Value Date   CHOL 144 09/02/2024   HDL 51.70 09/02/2024   LDLCALC 50 09/02/2024   LDLDIRECT 80.0 04/28/2022   TRIG 209.0 (H) 09/02/2024   CHOLHDL 3 09/02/2024

## 2024-09-08 NOTE — Assessment & Plan Note (Signed)
-   CPAP

## 2024-09-08 NOTE — Assessment & Plan Note (Signed)
 Overall appears to be doing well.  Follow.

## 2024-09-08 NOTE — Assessment & Plan Note (Signed)
 A1c increased on recent check - 9.9. she has been off ozempic  due to not being able to get prescription. No problems taking the medication. Agreeable to restart. Start .25mg  weekly. Plans to titrate up. Check sugars. Also continues on jardiance . Discussed referral to pharmacy/[facility] - for diabetes management. Agreeable. Low carb diet and exercise.

## 2024-09-08 NOTE — Assessment & Plan Note (Signed)
 Continue synthroid. Follow tsh.

## 2024-09-08 NOTE — Assessment & Plan Note (Signed)
 Check vitamin D level with next labs.  ?

## 2024-09-08 NOTE — Assessment & Plan Note (Signed)
 Continue to avoid antiinflammatories.  Continue ace inhibitor.  Dose recently increased. On hydrochlorothiazide .  Recently increased to one whole tablet per day by cardiology.  Continue f/u with nephrology.  Follow metabolic panel. GFR just checked  09/03/23 - 44.95.

## 2024-09-08 NOTE — Assessment & Plan Note (Signed)
 Continues on amlodipine , clonidine  and now on lisinopril  40mg  q day.  Back on hydrochlorothiazide  and now taking one whole tablet per day.  Renal artery doppler ordered by cardiology - at least moderate left renal artery stenosis.  Right kidney poorly visualized. Continues on jardiance .  Follow pressures.  Blood pressure overall doing better. Hold on making changes in medication. GFR 44.95. follow metabolic panel.

## 2024-09-12 ENCOUNTER — Telehealth: Payer: Self-pay

## 2024-09-12 NOTE — Progress Notes (Signed)
 Complex Care Management Note Care Guide Note  09/12/2024 Name: Lorraine Foley MRN: [id number] DOB: 1940/12/18   Complex Care Management Outreach Attempts: An unsuccessful telephone outreach was attempted today to offer the patient information about available complex care management services.  Follow Up Plan:  Additional outreach attempts will be made to offer the patient complex care management information and services.   Encounter Outcome:  No Answer  Dreama [facility]  [facility], [facility] VBCI Assistant Direct Dial: [phone]  Fax: [phone]

## 2024-09-12 NOTE — Progress Notes (Signed)
 Complex Care Management Note  Care Guide Note 09/12/2024 Name: Lorraine Foley MRN: [id number] DOB: Aug 05, 1941  Lorraine Foley is a 84 y.o. year old female who sees Glendia Shad, MD for primary care. I reached out to Lorraine Foley by phone today to offer complex care management services.  Lorraine Foley was given information about Complex Care Management services today including:   The Complex Care Management services include support from the care team which includes your Nurse Care Manager, Clinical Social Worker, or Pharmacist.  The Complex Care Management team is here to help remove barriers to the health concerns and goals most important to you. Complex Care Management services are voluntary, and the patient may decline or stop services at any time by request to their care team member.   Complex Care Management Consent Status: Patient agreed to services and verbal consent obtained.   Follow up plan:  Telephone appointment with complex care management team member scheduled for:  09/16/24 at 10:00 a.m.   Encounter Outcome:  Patient Scheduled  Dreama [facility]  [facility], [facility] VBCI Assistant Direct Dial: [phone]  Fax: [phone]

## 2024-09-13 ENCOUNTER — Telehealth: Payer: Self-pay | Admitting: Internal Medicine

## 2024-09-13 NOTE — Telephone Encounter (Signed)
 Pt picked up Placard from the office.

## 2024-09-13 NOTE — Telephone Encounter (Signed)
 noted

## 2024-09-16 ENCOUNTER — Other Ambulatory Visit

## 2024-09-16 DIAGNOSIS — E1165 Type 2 diabetes mellitus with hyperglycemia: Secondary | ICD-10-CM

## 2024-09-16 NOTE — Progress Notes (Unsigned)
 "  09/18/2024 Name: KHADEEJA ELDEN MRN: [id number] DOB: 1940/10/12  Subjective  Chief Complaint  Patient presents with   Diabetes    Reason for visit: ?  Lorraine Foley is a 84 y.o. female with a history of diabetes (type 2), who presents today for an initial diabetes pharmacotherapy visit. They were referred by their PCP for management of diabetes.  Pertinent PMH also includes HTN, HLD, hypothyroidism, CKD3, renal stenosis, OSA (mild), GERD, obesity, glaucoma, gout.  Known DM Complications: nephropathy and retinopathy   Care Team: Primary Care Provider: Glendia Shad, MD   Date of Last Diabetes Related Visit: with PCP on 09/04/24  Recent Summary of Change: 1/7: A1c 9.9%. off semaglutide  since halloween. States she could not get her prescription refilled in [place] .   Medication Access/Adherence: Prescription drug coverage: Payor: BLUE CROSS BLUE SHIELD MEDICARE / Plan: BCBS MEDICARE / Product Type: *No Product type* / .  - Current Patient Assistance: None (income = SSI retirement only. Does live with nephew and his wife, though for tax purposes is independent/household of 1).  - Medication Adherence: Patient denies missing doses of their medication.  Notes her first refills of the year were quite expensive, though she recalls each January having to pay more to reach her deductible. Last week, received Jardiance  x90 ds for $260. Also picked up 1 box of Ozempic  for high cost. Notes this is feasible if copay decreases after deductible similar to previous years.   Since Last visit / History of Present Illness: ?  Patient reports implementing plan from last visit. Denies issues since resuming Ozempic  at 0.25 mg weekly dose. Denies issues previously tolerating Ozempic  (highest dose achieved = 0.5 mg/wk which she stopped taking when traveling to [place] - issues getting refills).   Reported DM Regimen: ?  Jardiance  25 mg daily Ozempic  0.25 mg weekly Monday (second dose given this week)  DM  medications tried in the past:?  metformin (allergy, swelling of lips) Januvia 100 mg daily (Stopped 2018. patient cannot recall reason for changing therpy)  SMBG: glucometer ?  Checks each morning, fasting: 237, 188, 170   Hypo/Hyperglycemia: ?  Symptoms of hypoglycemia since last visit:? no  Symptoms of hyperglycemia since last visit:? no  DM Prevention:  Statin: Taking; high intensity.?  History of chronic kidney disease? yes History of albuminuria? yes, last UACR on 10/12/23 = 64 mg/g (68 mg/d 09/2022) ACE/ARB - Taking lisinopril  40 mg/d; Urine MA/CR Ratio - elevated.  Last eye exam: 05/17/24; Retinopathy Present Last foot exam: 09/04/2024 Tobacco Use: Never smoker Immunizations:? Flu: Up to date (Last: 05/06/2024); Pneumococcal: PPSV23 (2013 (after age 74)) PCV13 (2015, after age 49); Shingrix: Up to date (Last: 11/01/2018, 05/09/2018); Covid: Up to date (Last:05/25/2024); TDap: Up to date (Last: 05/16/2022)  Cardiovascular Risk Reduction History of clinical ASCVD? no History of heart failure? no History of hyperlipidemia? yes Current BMI: 35.9 kg/m2 (Ht 60 in, Wt 83.5 kg) Taking statin? yes; high intensity (rosuvastatin  20mg ) Taking aspirin? Yes. Indication unclear, possibly for renal stenosis Taking SGLT-2i? yes Taking GLP- 1 RA? yes      _______________________________________________  Objective    Review of Systems:? Limited in the setting of virtual visit  Constitutional:? No fever, chills or unintentional weight loss  GI:? No nausea, vomiting, constipation, diarrhea, abdominal pain, dyspepsia, change in bowel habits  Endocrine:? No polyuria, polyphagia or blurred vision    Vitals:  Wt Readings from Last 3 Encounters:  09/04/24 184 lb (83.5 kg)  05/29/24 182 lb 4  oz (82.7 kg)  05/06/24 181 lb 12.8 oz (82.5 kg)   BP Readings from Last 3 Encounters:  09/04/24 126/60  05/29/24 128/60  05/06/24 118/70   Pulse Readings from Last 3 Encounters:  09/04/24 (!) 54   05/29/24 (!) 53  05/06/24 62   Labs:?  Lab Results  Component Value Date   HGBA1C 9.9 (H) 09/02/2024   HGBA1C 8.1 (H) 05/03/2024   HGBA1C 8.2 (H) 12/14/2023   GLUCOSE 261 (H) 09/02/2024   MICRALBCREAT 64.0 10/12/2023   MICRALBCREAT 68 10/20/2022   MICRALBCREAT <9 05/01/2020   CREATININE 1.13 09/02/2024   CREATININE 1.21 (H) 05/03/2024   CREATININE 1.35 (H) 12/25/2023   GFR 44.95 (L) 09/02/2024   GFR 41.50 (L) 05/03/2024   GFR 40.81 (L) 12/14/2023   Lab Results  Component Value Date   CHOL 144 09/02/2024   LDLCALC 50 09/02/2024   LDLCALC 55 05/03/2024   LDLCALC 59 12/14/2023   LDLDIRECT 80.0 04/28/2022   HDL 51.70 09/02/2024   TRIG 209.0 (H) 09/02/2024   TRIG 166.0 (H) 05/03/2024   TRIG 171.0 (H) 12/14/2023   ALT 15 09/02/2024   ALT 11 05/03/2024   AST 13 09/02/2024   AST 15 05/03/2024     Chemistry      Component Value Date/Time   NA 138 09/02/2024 0950   NA 141 12/25/2023 0921   NA 132 (L) 01/04/2012 0305   K 4.1 09/02/2024 0950   K 3.6 01/04/2012 0305   CL 97 09/02/2024 0950   CL 100 01/04/2012 0305   CO2 34 (H) 09/02/2024 0950   CO2 27 01/04/2012 0305   BUN 35 (H) 09/02/2024 0950   BUN 27 12/25/2023 0921   BUN 16 01/04/2012 0305   CREATININE 1.13 09/02/2024 0950   CREATININE 1.14 01/04/2012 0305   GLU 158 03/25/2015 0000      Component Value Date/Time   CALCIUM  9.5 09/02/2024 0950   CALCIUM  8.1 (L) 01/04/2012 0305   ALKPHOS 65 09/02/2024 0950   AST 13 09/02/2024 0950   ALT 15 09/02/2024 0950   BILITOT 0.7 09/02/2024 0950   BILITOT 0.8 12/23/2020 1007     The ASCVD Risk score (Arnett DK, et al., 2019) failed to calculate for the following reasons:   The 2019 ASCVD risk score is only valid for ages 67 to 84   * - Cholesterol units were assumed  Assessment and Plan:   1. Diabetes, type 2: uncontrolled per last A1c of 9.9% (09/02/24), increased from previous, 8.1% after stopping Ozempic  0.5 mg/wk. Goal <7% without hypoglycemia. No issues  historically tolerating Ozempic , highest dose achieved 0.5 mg/wk.  Glucometer data shows FBG 170s-200s?. Current Regimen: Jardiance  25 mg/d, Ozempic  0.25 mg/wk.  HCM: Up to date Continue medications today without changes.  Reviewed dosing of current Ozempic  pen (two more 0.25 mg injections, then increase to 0.5 mg weekly. 2 doses in current pen, then refill at pharmacy). Reviewed s/sx/tx hypoglycemia Future Consideration: GLP1-RA: If Ozempic  becomes unaffordable in 2026, could consider Trulicity via Patient Assistance.  Metformin: Allergy  SU: Not unreasonable, though defer given alternative options with lower risk of hypoglycemia.  TZD: Avoiding due to possible weight gain/increase in fracture risk with only moderate A1c-lowering.  Insulin : Defer as last resort or as needed for severe hyperglycemia given risk hypoglycemia. Other safer options at this time. Likely to achieve goal A1c if Glp1 titrations are tolerated.      2. Medication Access: Jardiance , Ozempic , Rocklatan = high cost at beginning of the year.  Patient eels cost is feasible if copay after deductible similar to last year (~$20-30/month).  Jardiance  Patient assistance started  (based on income, should qualify for free Jardiance  thorough 2026) Rocklatan: [employer] PAP if annual income <$54,775. Application mailed to patient to provide to [facility].  Michel: BCBS Medicare, unchanged from 2026   2. HTN: controlled based on last clinic BP of 126/60 mmHg (1/7/6), goal <130/80 mmHg.  Denies lightheadedness, dizziness, SOB, CP, vision changes. Denies gout flares.  Continue medications without changes.  Future Consideration: If c/f uric acid elevation or returning gout flares, note hydrochlorothiazide  may increase uric acid.    3. ASCVD (primary prevention): controlled on last lipid panel with LDL 50 mg/dL, though TG slightly elevated at 209 mg/dL (04/01/72). LDL goal <70 mg/dL (primary prevention, diabetes). TG increase, possibly in  the setting of worsened glycemic control in recent months.  Key risk factors: diabetes, hypertension (well-controlled), hyperlipidemia (well-controlled), BMI >30 kg/m2, and sedentary lifestyle Continue medications today without changes.    4. Healthcare Maintenance:  Pneumococcal - Current status: Complete. Consider risk/benefit PCV20 per significant comorbidities.  Shingles - Current status: Complete Influenza - Current status: up to date 2025-2026 season  Due to receive the following vaccines: N/A   Follow Up Follow up with clinical pharmacist via phone in 4 weeks Patient given direct line for questions regarding medication therapy  Future Appointments  Date Time Provider Department Center  10/14/2024 10:00 AM [facility] PHARMACIST [facility] [address]  11/05/2024  3:00 PM Glendia Shad, MD [facility] [address]  04/21/2025  9:30 AM [facility] ANNUAL WELLNESS VISIT [facility] [address] FABIENE Geronimo, PharmD, [place], CPP Clinical Pharmacist Practitioner [facility] Ph: [phone]  "

## 2024-09-18 ENCOUNTER — Telehealth: Payer: Self-pay | Admitting: Internal Medicine

## 2024-09-18 ENCOUNTER — Encounter: Payer: Self-pay | Admitting: Pharmacist

## 2024-09-18 NOTE — Progress Notes (Signed)
 Patient Assistance Program (PAP) Application   Manufacturer: [employer] ([employer])    (New enrollment) Medication(s): Jardiance  25 mg  Patient Portion of Application:  1/19: Filled out and uploaded to clinic eFax folder for patient signature in front office. Re-uploaded 09/18/24.  Income Documentation: Patient preference to drop off copy of income documentation at clinic front desk. Social security benefit letter.   Provider Portion of Application:  09/18/24: Provider portion completed by PharmD and uploaded PCP eFax folder for signature. Clinical Pool/CMA notified.  Prescription(s): Included in PAP application. Jardiance  25 mg daily   Next Steps: [x]    Provider portion of application filled out and uploaded to [employer] eFax folder for review/signature []    CMA: Upon PCP signature, Application to be faxed to [facility] PAP team AND scanned to chart: [facility] PAP Team: CPhT Patient Advocate Team Fax: [phone] []    Patient portion of application at front office. Patient to sign in clinic. App with income doc can be faxed to [facility] PAP team AND scanned to chart  []    Med Advocate Team: Upon receipt via Med Advocate Onbase, please fax/submit to program.   Note routed to PCP Clinic Pool to ensure PCP signature is obtained and application is faxed. Patient Advocate PAP Spreadsheet updated and Pool cc'd as FYI (application not submitted by clinic, Advocate team to fax to company once received)  [facility] clinic team - Please document as Next Steps are completed in office*

## 2024-09-18 NOTE — Patient Instructions (Signed)
 Ms. Lorraine Foley,   It was a pleasure to speak with you today! As we discussed:?   Continue Ozempic  0.25 mg once weekly for a total of 4 doses.  On your 5th dose, you can keep turning the dial on the pen past the 0.25 and you will see the dose 0.5 mg (there are two extra 0.5 mg doses in that first pen, then you will refill at the pharmacy for a new pen which will contain four total doses of 0.5 mg).  We will start an application to apply for free Jardiance . This will require your signature as well as a copy of your social security benefit letter that you get in the mail each year.   It appears there may also be a similar program for your eye drop. I can mail this to you to provide to your eye doctor.   Please reach out prior to your next scheduled appointment should you have any questions or concerns.  You may reach me via MyChart Message, or you may leave me a voicemail at [phone] and I will get back to you shortly.   Thank you!   Future Appointments  Date Time Provider Department Center  10/14/2024 10:00 AM [facility] PHARMACIST [facility] [address]  11/05/2024  3:00 PM Glendia Shad, MD [facility] [address]  04/21/2025  9:30 AM [facility] ANNUAL WELLNESS VISIT [facility] [address] FABIENE Geronimo, PharmD, [place], CPP Clinical Pharmacist Practitioner  [facility] Ph: [phone]

## 2024-09-18 NOTE — Telephone Encounter (Signed)
 Pt dropped off forms for Dr. Glendia and has been placed in back mailbox.

## 2024-09-19 ENCOUNTER — Telehealth: Payer: Self-pay | Admitting: Internal Medicine

## 2024-09-19 NOTE — Telephone Encounter (Signed)
 Pt came in to complete the Pt Assistance pt consent form. It's in the pharmacist mailbox up front

## 2024-09-20 NOTE — Progress Notes (Signed)
 Lorraine Foley, we haven't received your fax as of yet. Just wanted to give you an update

## 2024-09-24 ENCOUNTER — Telehealth: Payer: Self-pay

## 2024-09-24 NOTE — Telephone Encounter (Signed)
 PAP: Application for Bernadine has been submitted to [employer]), via fax

## 2024-09-24 NOTE — Progress Notes (Signed)
 Signed and placed in box.

## 2024-09-24 NOTE — Progress Notes (Signed)
 Given to [place]

## 2024-09-26 NOTE — Telephone Encounter (Signed)
 PAP: Patient assistance application for Jardiance  has been approved by PAP Companies: [employer] from 09/25/2024 to 08/28/2025. Medication should be delivered to PAP Delivery: Home. For further shipping updates, please contact [employer] ([employer]) at [phone]. Patient ID is: [id number]

## 2024-10-14 ENCOUNTER — Other Ambulatory Visit

## 2024-11-05 ENCOUNTER — Ambulatory Visit: Admitting: Internal Medicine

## 2025-04-21 ENCOUNTER — Ambulatory Visit
